# Patient Record
Sex: Female | Born: 1960 | Race: White | Hispanic: No | Marital: Married | Smoking: Never smoker
Health system: Southern US, Community
[De-identification: ages and names within clinical notes are randomized; demographics above are authoritative.]

## PROBLEM LIST (undated history)

## (undated) DIAGNOSIS — G5601 Carpal tunnel syndrome, right upper limb: Secondary | ICD-10-CM

## (undated) DIAGNOSIS — Z9889 Other specified postprocedural states: Secondary | ICD-10-CM

## (undated) DIAGNOSIS — E785 Hyperlipidemia, unspecified: Secondary | ICD-10-CM

## (undated) DIAGNOSIS — Z8669 Personal history of other diseases of the nervous system and sense organs: Secondary | ICD-10-CM

## (undated) DIAGNOSIS — Q185 Microstomia: Secondary | ICD-10-CM

## (undated) DIAGNOSIS — Z87442 Personal history of urinary calculi: Secondary | ICD-10-CM

## (undated) DIAGNOSIS — R112 Nausea with vomiting, unspecified: Secondary | ICD-10-CM

## (undated) DIAGNOSIS — Z98811 Dental restoration status: Secondary | ICD-10-CM

## (undated) DIAGNOSIS — K589 Irritable bowel syndrome without diarrhea: Secondary | ICD-10-CM

## (undated) DIAGNOSIS — K219 Gastro-esophageal reflux disease without esophagitis: Secondary | ICD-10-CM

## (undated) DIAGNOSIS — M1990 Unspecified osteoarthritis, unspecified site: Secondary | ICD-10-CM

## (undated) DIAGNOSIS — Z8489 Family history of other specified conditions: Secondary | ICD-10-CM

## (undated) HISTORY — DX: Hyperlipidemia, unspecified: E78.5

## (undated) HISTORY — PX: EYE SURGERY: SHX253

## (undated) HISTORY — PX: CYSTO: SHX6284

## (undated) HISTORY — PX: BACK SURGERY: SHX140

## (undated) HISTORY — PX: MAXIMUM ACCESS (MAS)POSTERIOR LUMBAR INTERBODY FUSION (PLIF) 1 LEVEL: SHX6368

## (undated) HISTORY — DX: Irritable bowel syndrome, unspecified: K58.9

## (undated) HISTORY — DX: Gastro-esophageal reflux disease without esophagitis: K21.9

---

## 1998-08-02 ENCOUNTER — Other Ambulatory Visit: Admission: RE | Admit: 1998-08-02 | Discharge: 1998-08-02 | Payer: Self-pay | Admitting: Obstetrics and Gynecology

## 1999-08-13 ENCOUNTER — Other Ambulatory Visit: Admission: RE | Admit: 1999-08-13 | Discharge: 1999-08-13 | Payer: Self-pay | Admitting: Obstetrics and Gynecology

## 2000-09-17 ENCOUNTER — Other Ambulatory Visit: Admission: RE | Admit: 2000-09-17 | Discharge: 2000-09-17 | Payer: Self-pay | Admitting: Obstetrics and Gynecology

## 2001-09-30 ENCOUNTER — Other Ambulatory Visit: Admission: RE | Admit: 2001-09-30 | Discharge: 2001-09-30 | Payer: Self-pay | Admitting: Obstetrics and Gynecology

## 2002-03-31 ENCOUNTER — Inpatient Hospital Stay (HOSPITAL_COMMUNITY): Admission: AD | Admit: 2002-03-31 | Discharge: 2002-04-02 | Payer: Self-pay | Admitting: Obstetrics and Gynecology

## 2002-03-31 ENCOUNTER — Encounter: Payer: Self-pay | Admitting: Obstetrics and Gynecology

## 2002-03-31 ENCOUNTER — Encounter (INDEPENDENT_AMBULATORY_CARE_PROVIDER_SITE_OTHER): Payer: Self-pay

## 2002-03-31 HISTORY — PX: SALPINGOOPHORECTOMY: SHX82

## 2002-03-31 HISTORY — PX: TOTAL ABDOMINAL HYSTERECTOMY: SHX209

## 2003-03-08 ENCOUNTER — Other Ambulatory Visit: Admission: RE | Admit: 2003-03-08 | Discharge: 2003-03-08 | Payer: Self-pay | Admitting: Obstetrics and Gynecology

## 2003-06-14 ENCOUNTER — Ambulatory Visit (HOSPITAL_BASED_OUTPATIENT_CLINIC_OR_DEPARTMENT_OTHER): Admission: RE | Admit: 2003-06-14 | Discharge: 2003-06-14 | Payer: Self-pay | Admitting: Orthopedic Surgery

## 2003-06-14 HISTORY — PX: INCISION / DEBRIDEMENT BONE ELBOW: SUR694

## 2003-08-14 ENCOUNTER — Encounter: Admission: RE | Admit: 2003-08-14 | Discharge: 2003-11-12 | Payer: Self-pay | Admitting: Orthopedic Surgery

## 2005-07-11 ENCOUNTER — Other Ambulatory Visit: Admission: RE | Admit: 2005-07-11 | Discharge: 2005-07-11 | Payer: Self-pay | Admitting: Obstetrics and Gynecology

## 2006-10-27 IMAGING — CR DG CHEST 2V
2 series · 2 of 2 positions shown · non-contrast
Comparison: None.

CLINICAL DATA: Fever.
 CHEST - 2 VIEW:

[w chest pa]
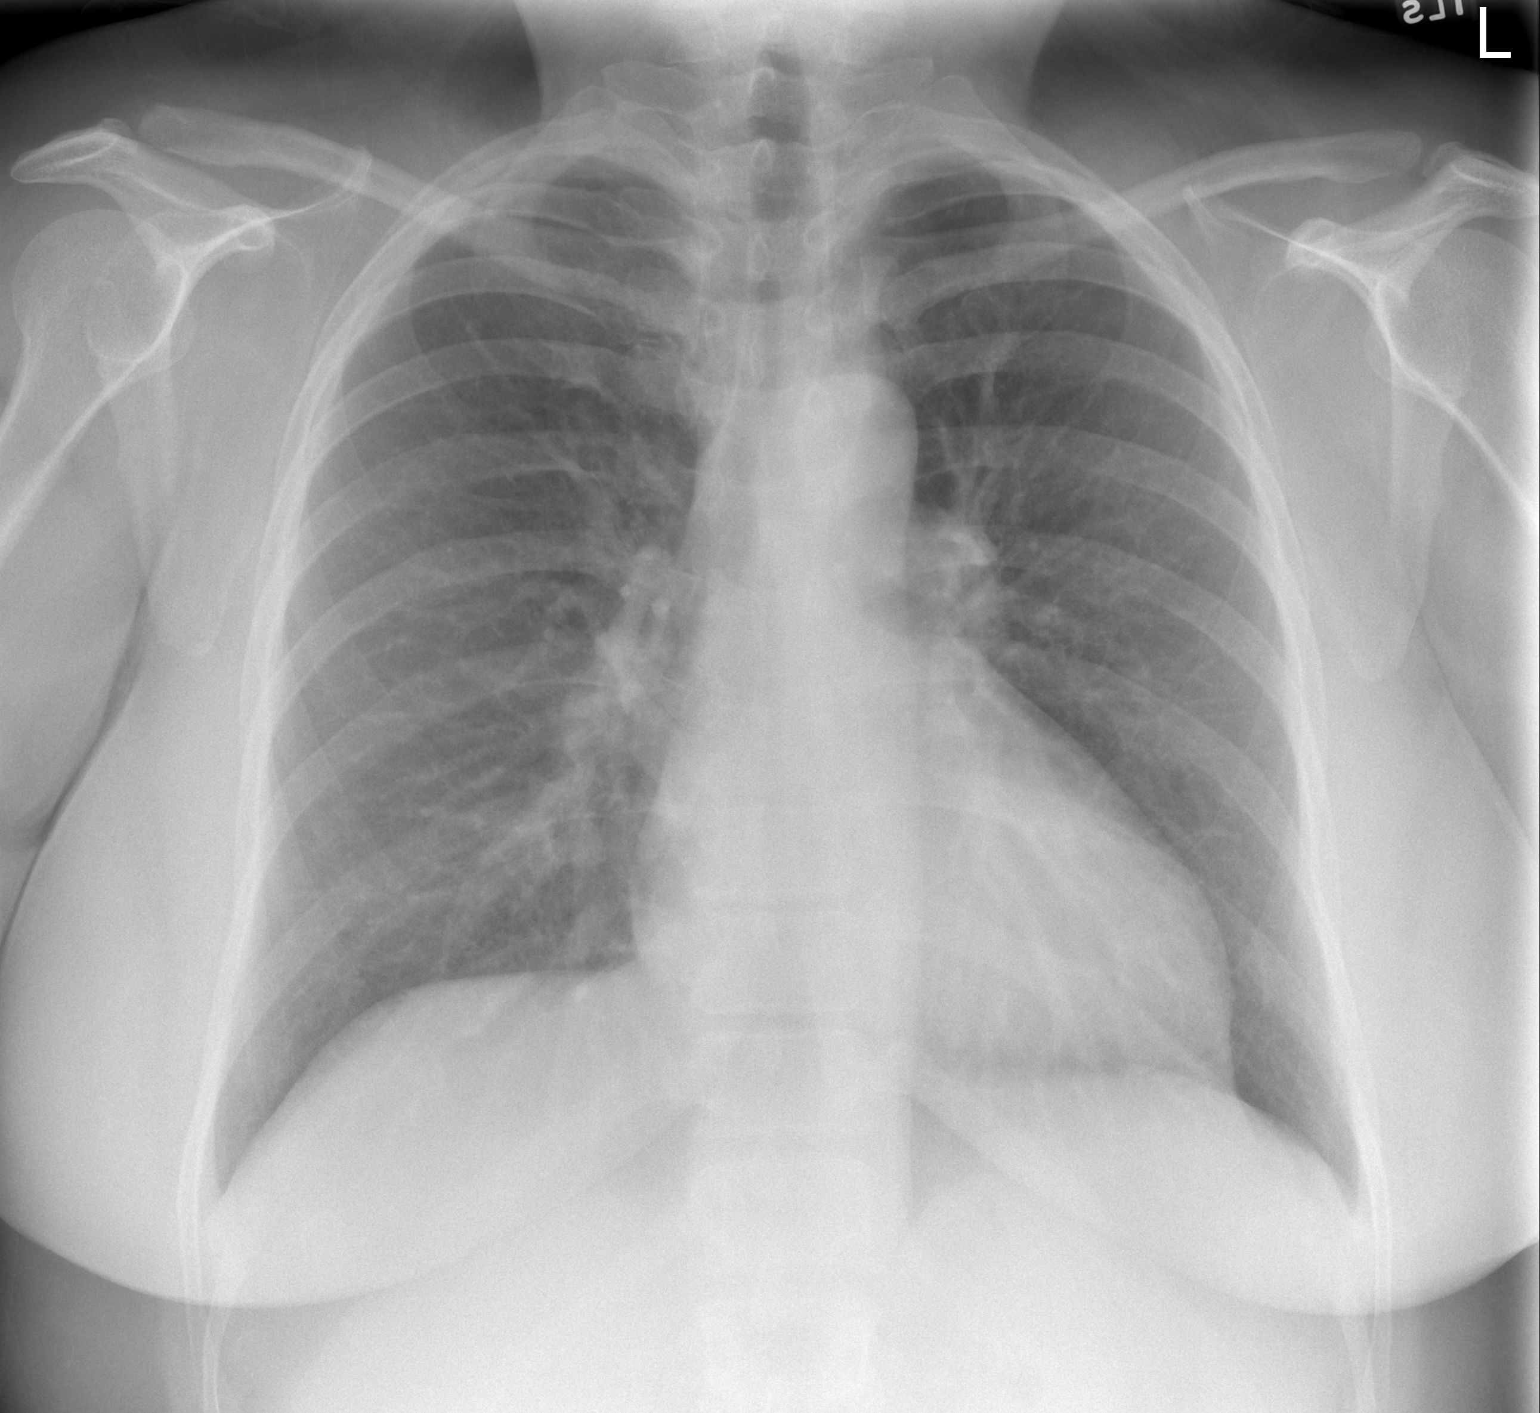

[w chest lat]
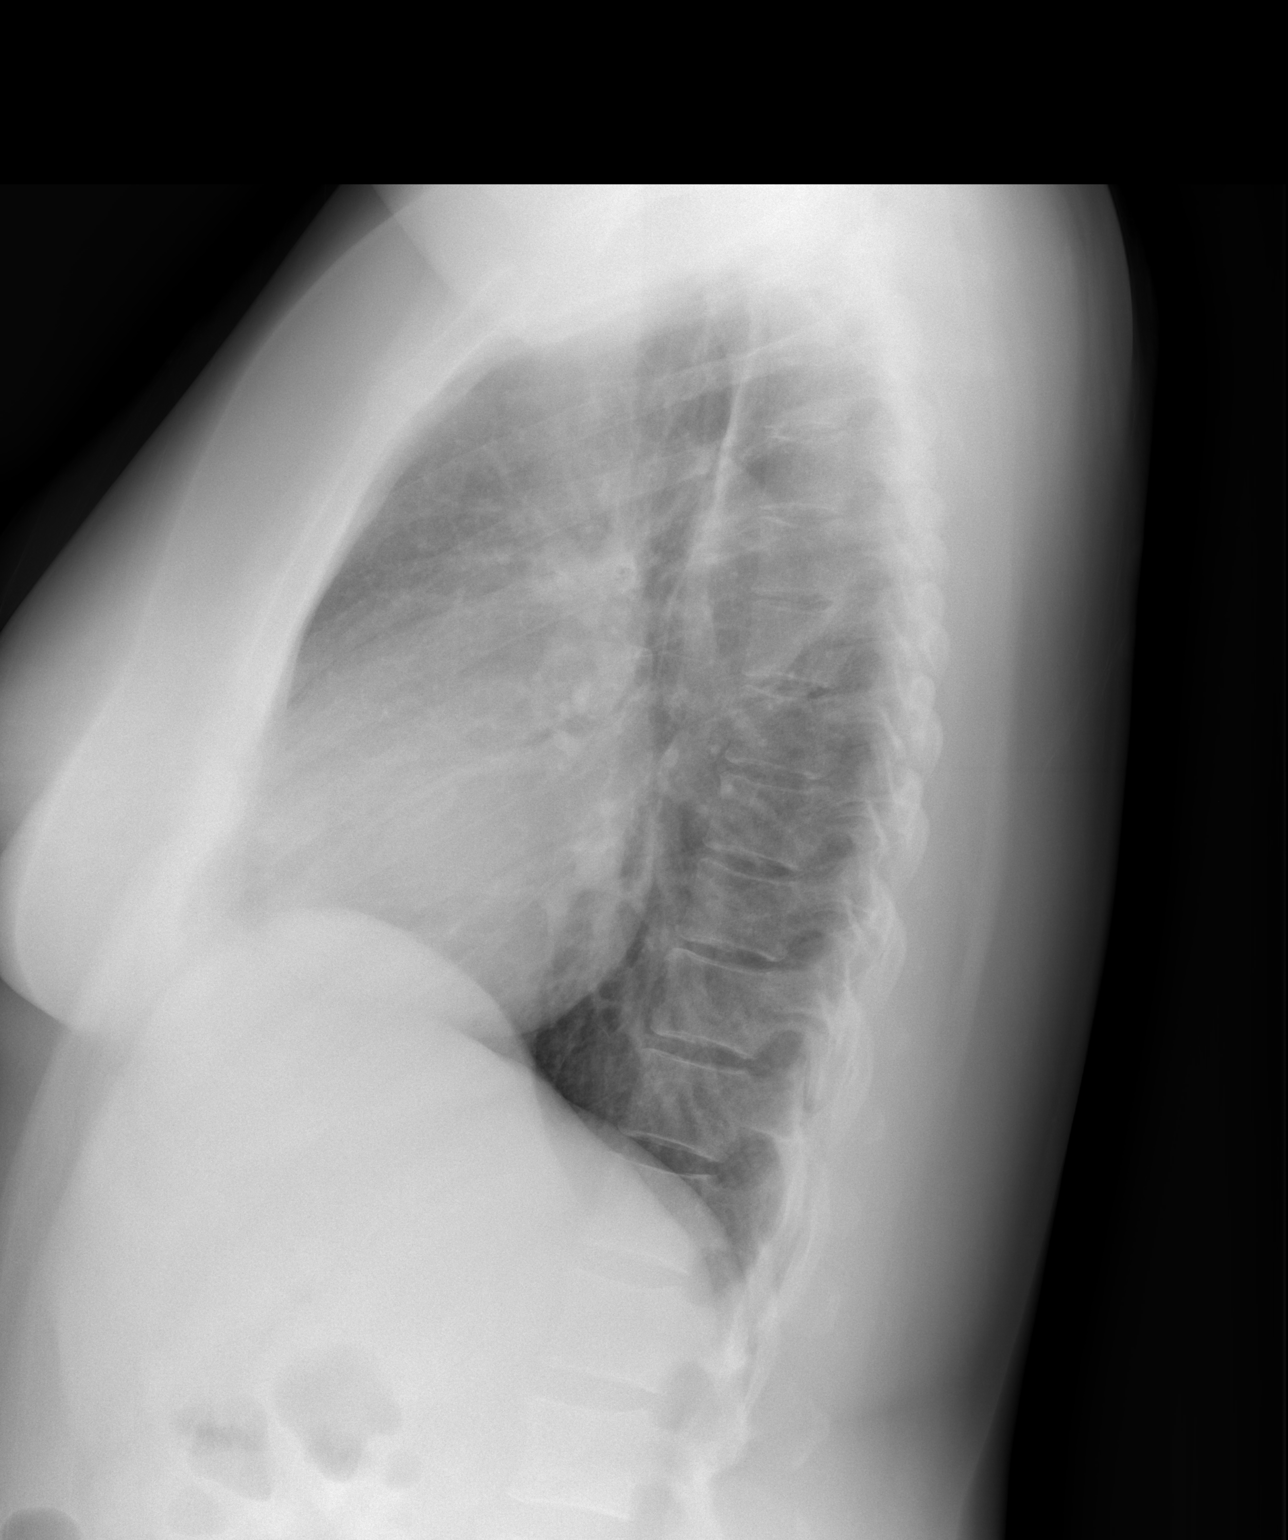

[2 of 2 positions shown; findings below may reference images not displayed]

FINDINGS: Heart size mildly enlarged.  The lungs are clear.  No pleural effusion.
IMPRESSION: Mild cardiomegaly, no edema.

## 2006-11-07 ENCOUNTER — Encounter: Admission: RE | Admit: 2006-11-07 | Discharge: 2006-11-07 | Payer: Self-pay | Admitting: Family Medicine

## 2006-11-07 IMAGING — CT CT PELVIS W/O CM
2 of 3 series · 17 of 46 positions shown, 19 images · IV contrast (agent unspecified)
Comparison: none

CLINICAL DATA: 45-year-old female, right flank pain.   History of renal calculi.  Status-post hysterectomy.  
ABDOMEN CT WITHOUT CONTRAST:
TECHNIQUE: Multidetector CT imaging of the abdomen was performed following the standard protocol without IV contrast.
TECHNIQUE: Multidetector CT imaging of the pelvis was performed following the standard protocol without IV contrast.

[Series 2: renal stone w/o · axial · non-contrast · 0.70mm/px · z∈[-432,-52]mm · 14 of 88 slices shown, 16 images]
[im 6/88  soft-tissue]
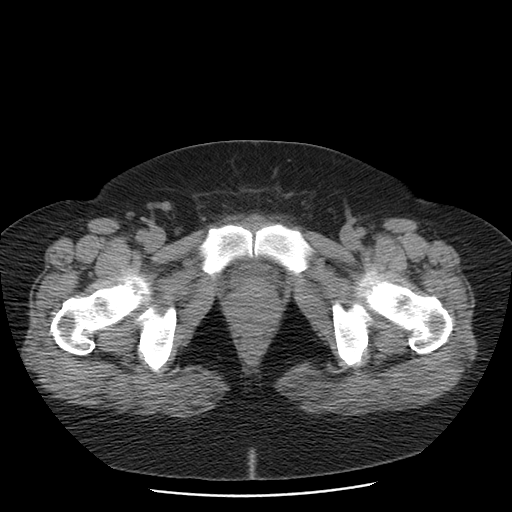
[im 6/88  bone]
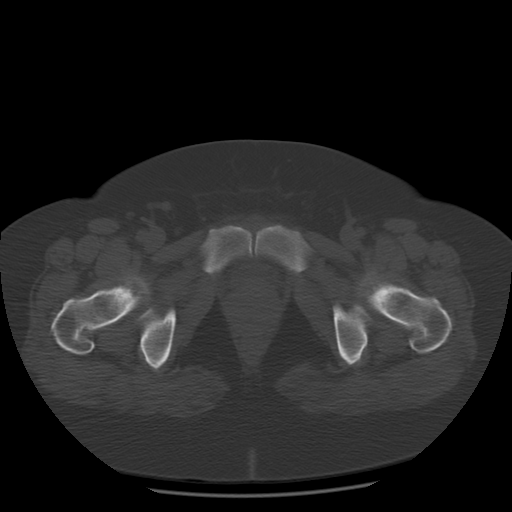
[im 12/88  soft-tissue]
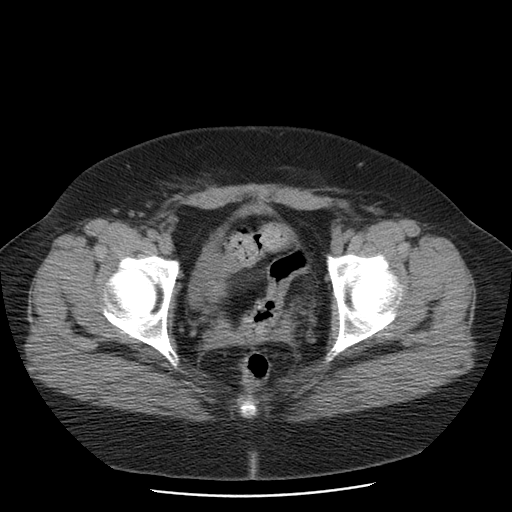
[im 17/88  soft-tissue]
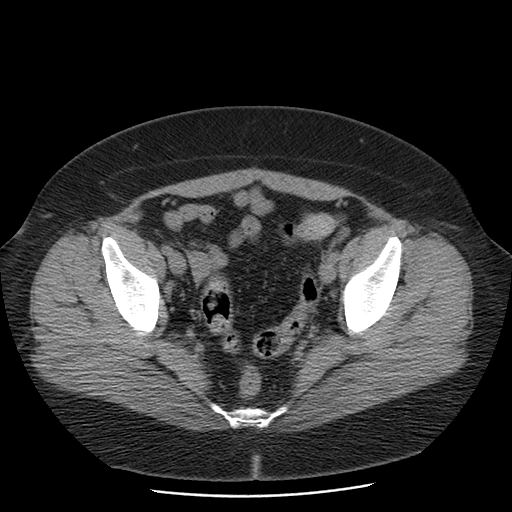
[im 23/88  soft-tissue]
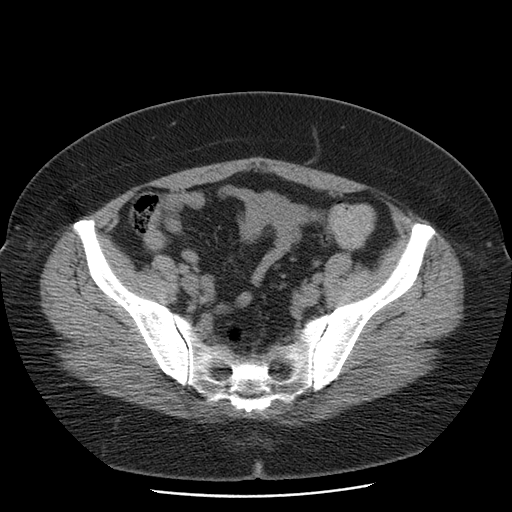
[im 29/88  soft-tissue]
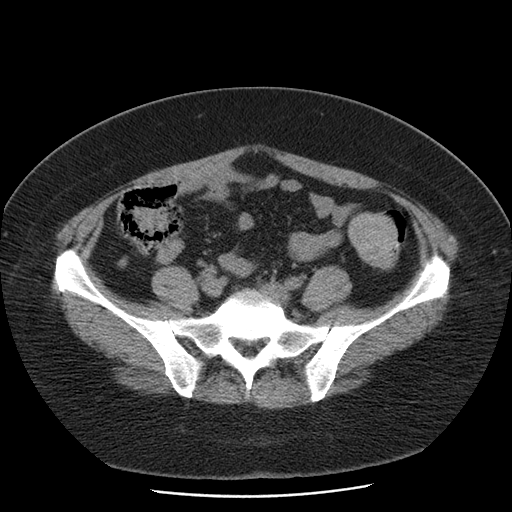
[im 34/88  soft-tissue]
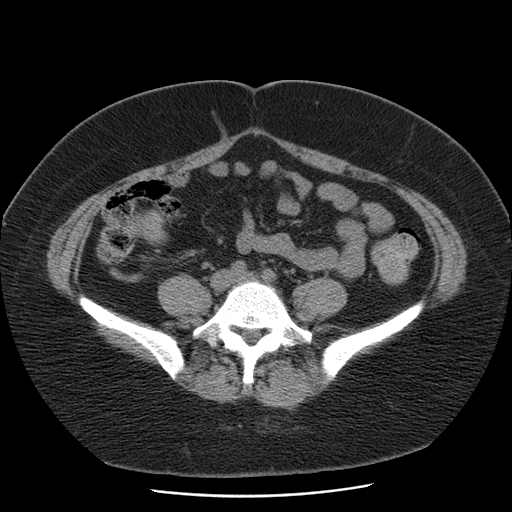
[im 40/88  soft-tissue]
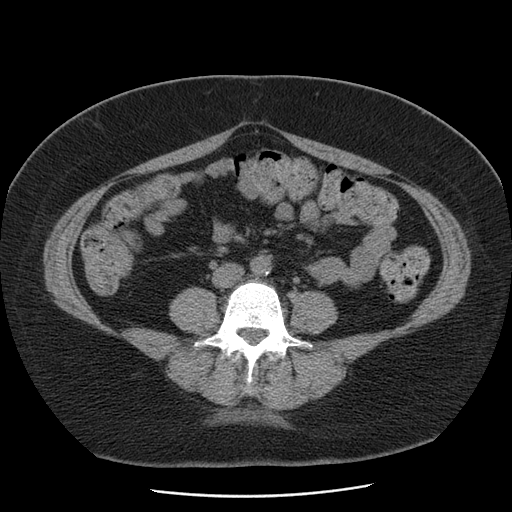
[im 48/88  soft-tissue]
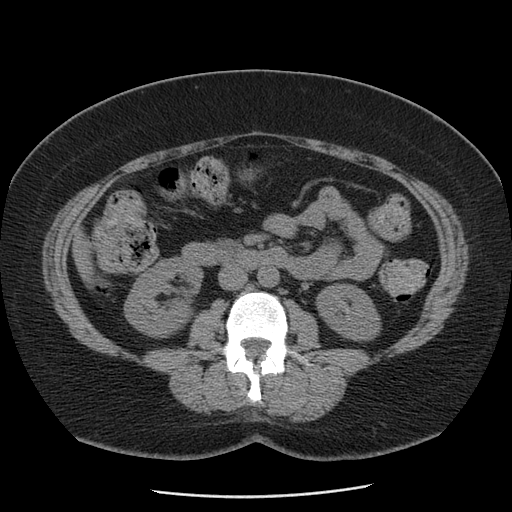
[im 54/88  soft-tissue]
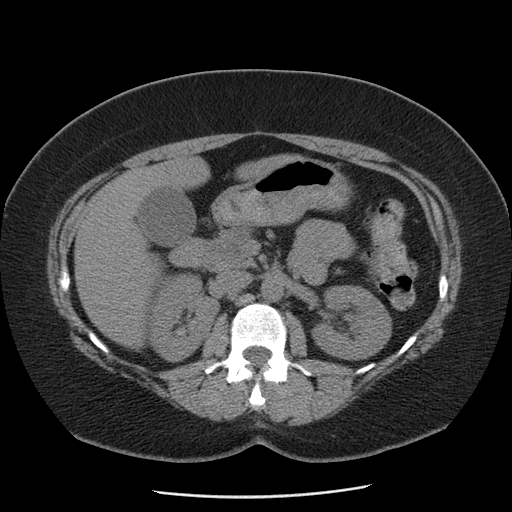
[im 54/88  bone]
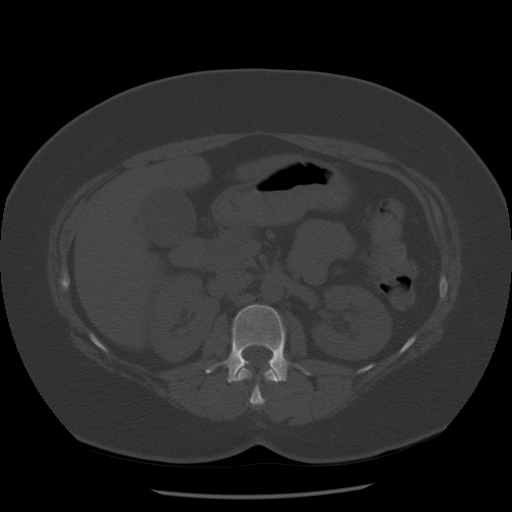
[im 59/88  soft-tissue]
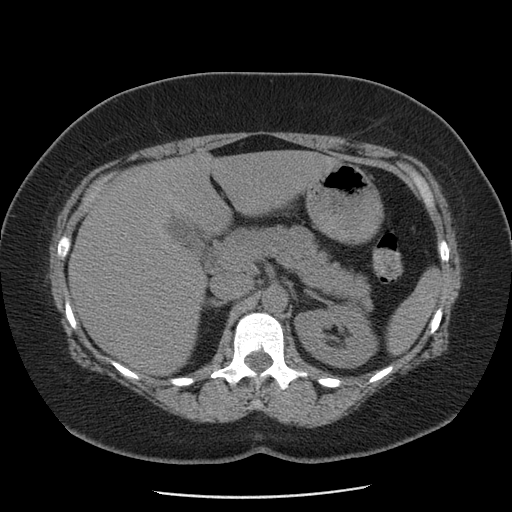
[im 65/88  soft-tissue]
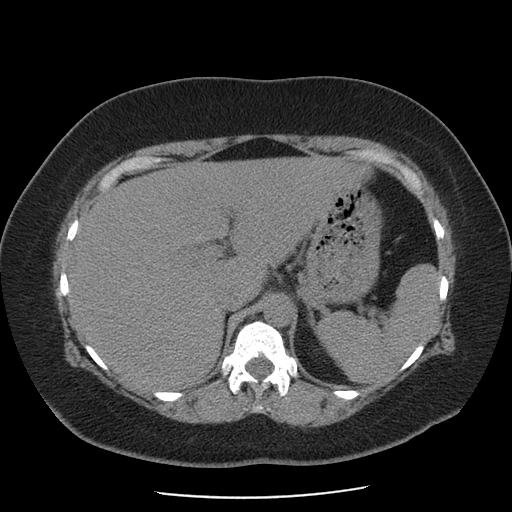
[im 71/88  soft-tissue]
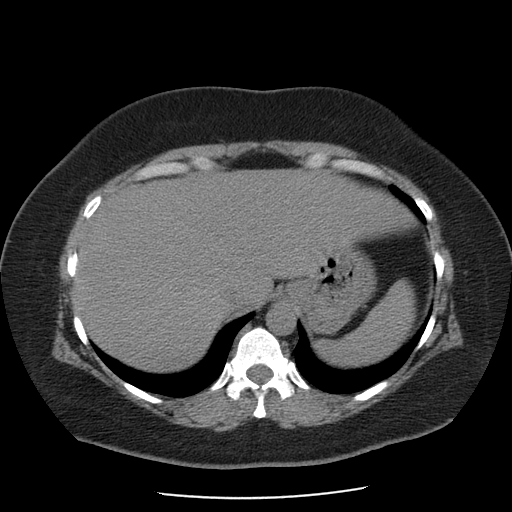
[im 76/88  soft-tissue]
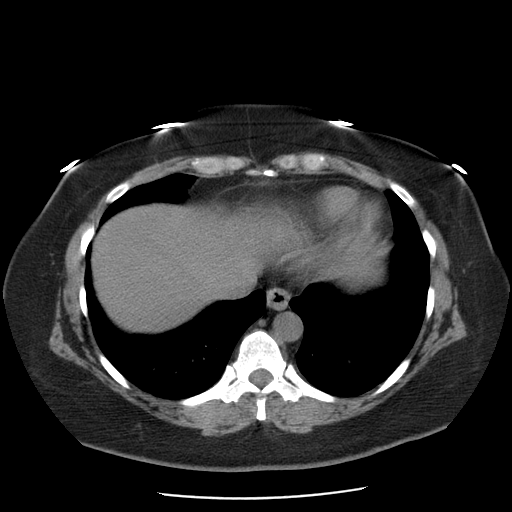
[im 82/88  soft-tissue]
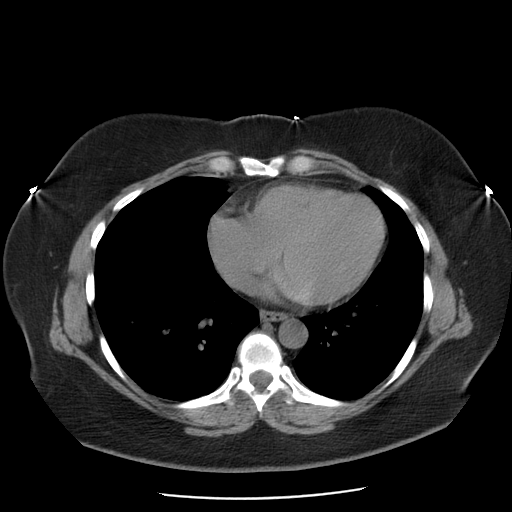

[Series 400: coronals · coronal · 0.90mm/px · 3 of 103 slices shown]
[im 35/103  soft-tissue]
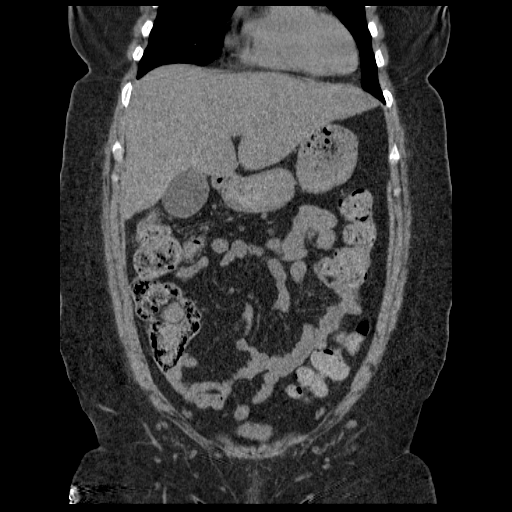
[im 46/103  soft-tissue]
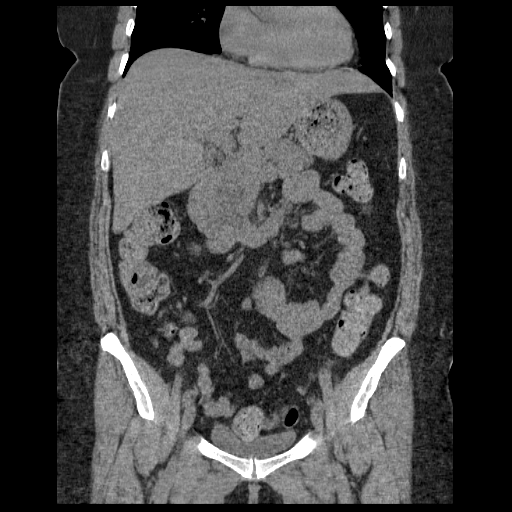
[im 57/103  soft-tissue]
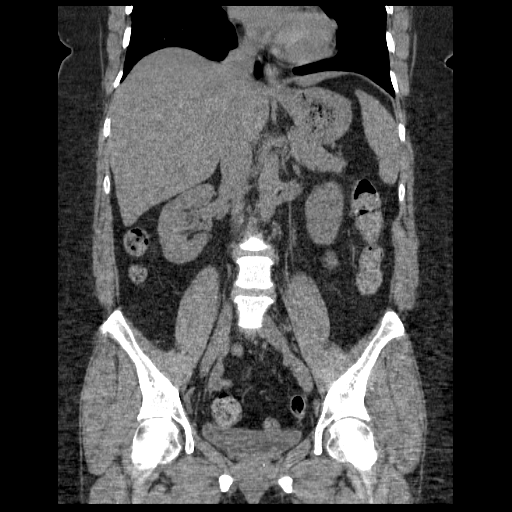

[17 of 46 positions shown; findings below may reference images not displayed]

FINDINGS: Kidneys demonstrate no obstructing upper urinary tract calculi, acute obstructive uropathy, hydronephrosis, or dilated ureter.  Kidney are symmetric in size, shape, and contour.  The liver, gallbladder, biliary system, adrenal glands, spleen, and pancreas are normal for a noncontrast study.  No obstruction of the bowel, ascites, adenopathy, fluid collection, or free air.  In the right lower quadrant, the terminal ileum and appendix are normal.  Minimal atherosclerosis of the aortic bifurcation.   No aneurysm.   Punctate tiny 1-2 mm non-obstructing left renal calculi are noted on the coronal reconstructions, image 63.
IMPRESSION: 1.  No acute obstructive uropathy or obstructing urinary tract calculi.  No acute finding in the abdomen by noncontrast imaging. 
2.  Suspect tiny punctate nonobstructing left nephrolithiasis. 
PELVIS CT WITHOUT CONTRAST:
FINDINGS: No distal ureteral obstruction, dilatation, calculus or UVJ abnormality.  Patient is status-post hysterectomy.  No additional acute pelvic finding.
IMPRESSION: No acute intrapelvic finding or distal stone disease.

## 2007-03-23 IMAGING — CT CT PELV EXAM
2 of 4 series · 12 of 42 positions shown, 19 images · non-contrast
Comparison: NONE

CLINICAL DATA: Severe left lower quadrant pain on [DATE]; 
mild  discomfort since. Evaluate for femoral hernia 

CT PELVIS WITHOUT AND WITH INTRAVENOUS AND

[Series 3: venous · axial · portal-venous · 0.75mm/px · z∈[+610,+844]mm · 9 of 59 slices shown, 15 images]
[im 6/59  soft-tissue]
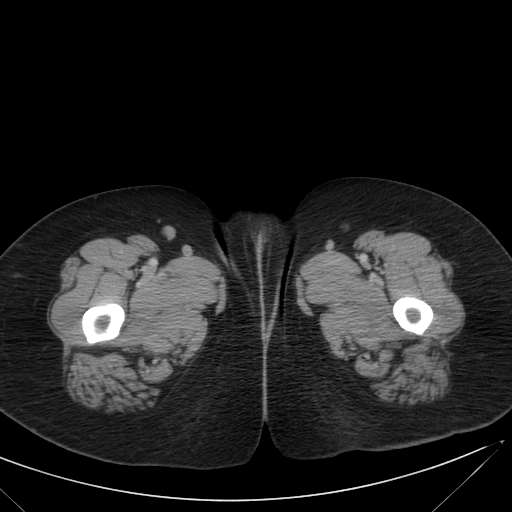
[im 6/59  bone]
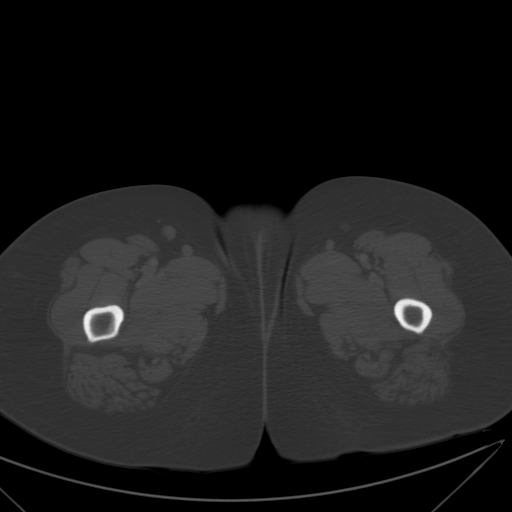
[im 12/59  soft-tissue]
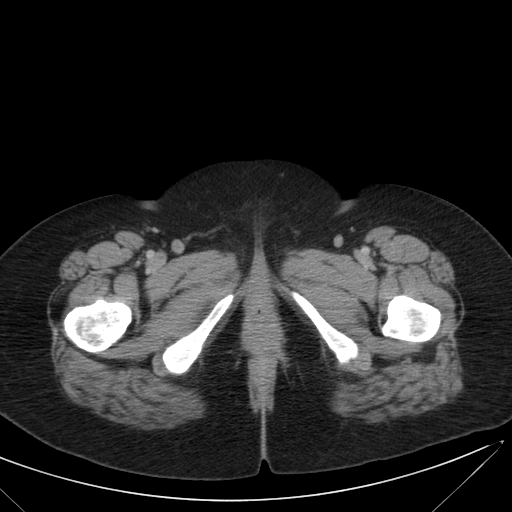
[im 18/59  soft-tissue]
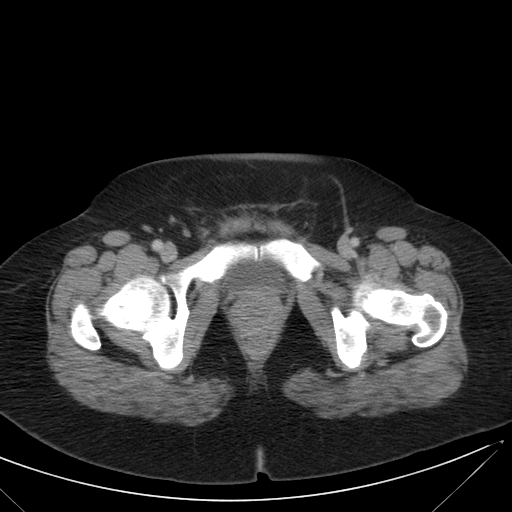
[im 24/59  soft-tissue]
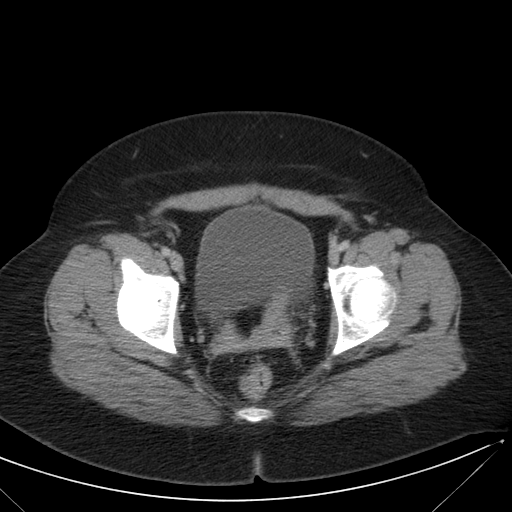
[im 30/59  soft-tissue]
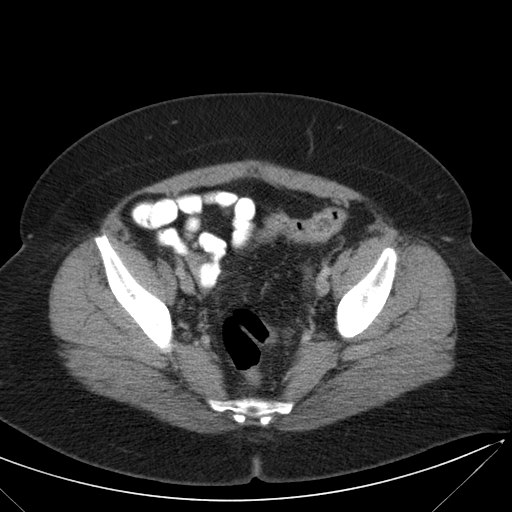
[im 35/59  soft-tissue]
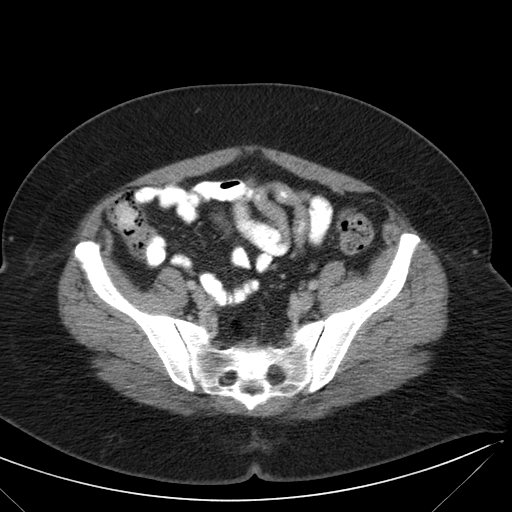
[im 35/59  lung]
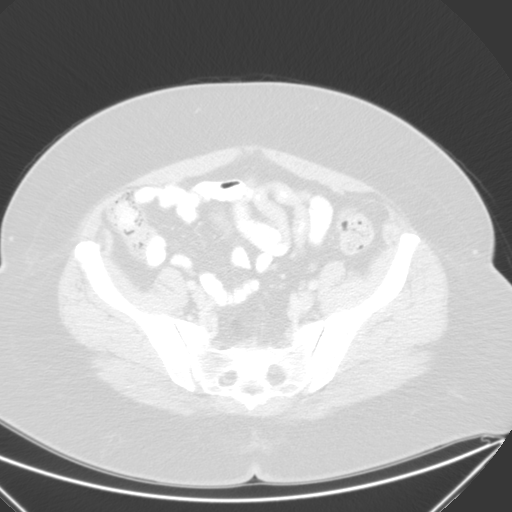
[im 41/59  soft-tissue]
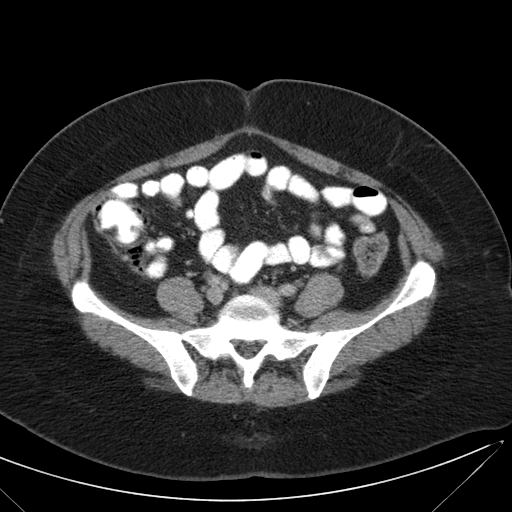
[im 41/59  lung]
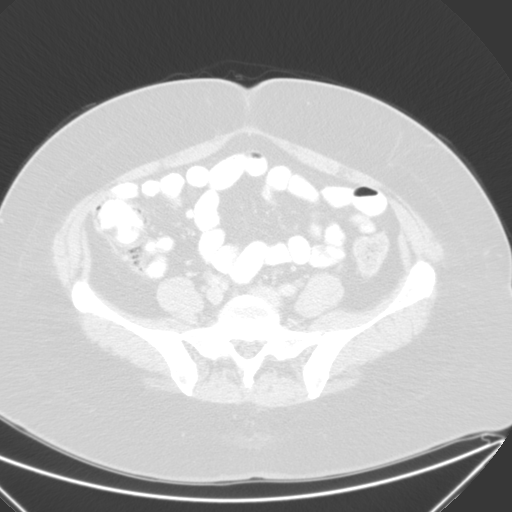
[im 47/59  soft-tissue]
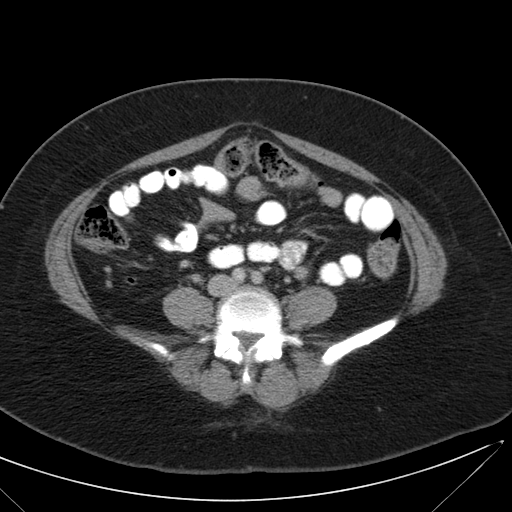
[im 47/59  lung]
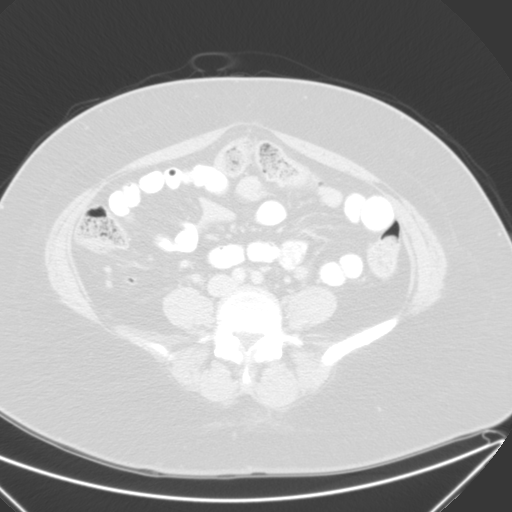
[im 53/59  soft-tissue]
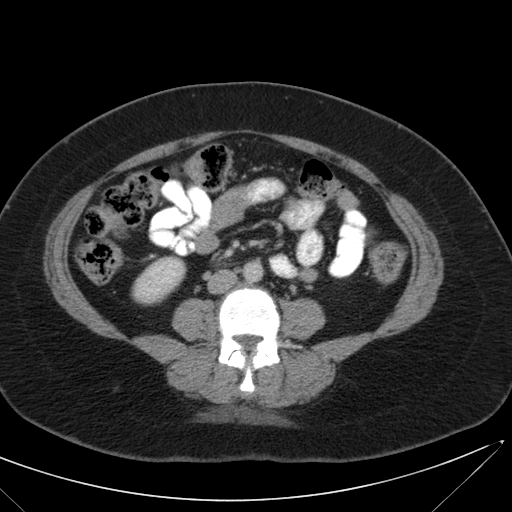
[im 53/59  lung]
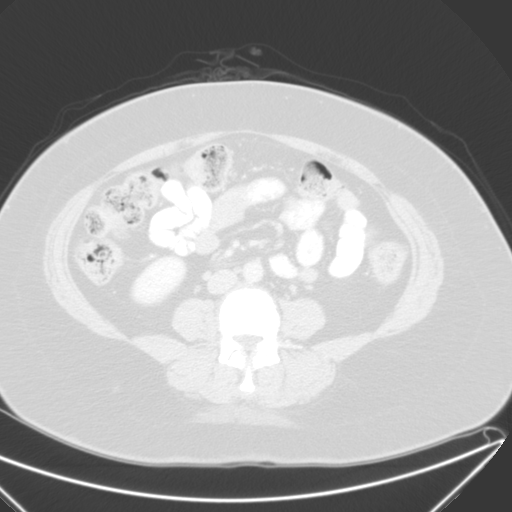
[im 53/59  bone]
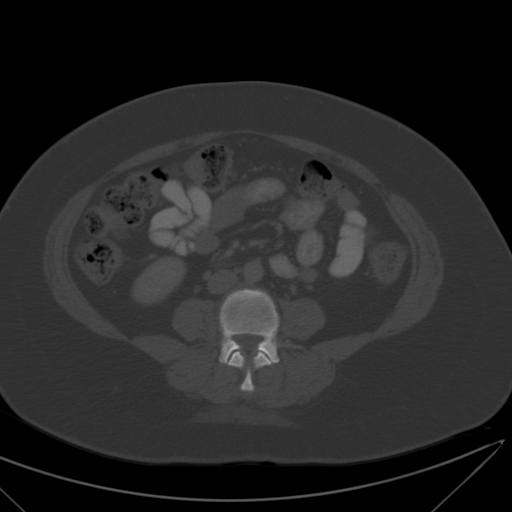

[coronals · coronal · 0.75mm/px · 3 of 72 slices shown, 4 images]
[im 24/72  soft-tissue]
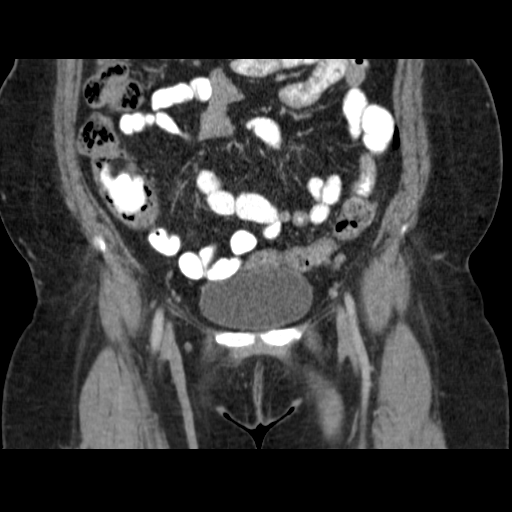
[im 32/72  soft-tissue]
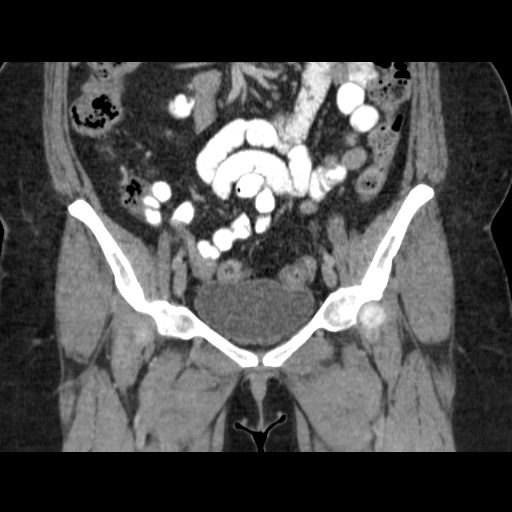
[im 32/72  bone]
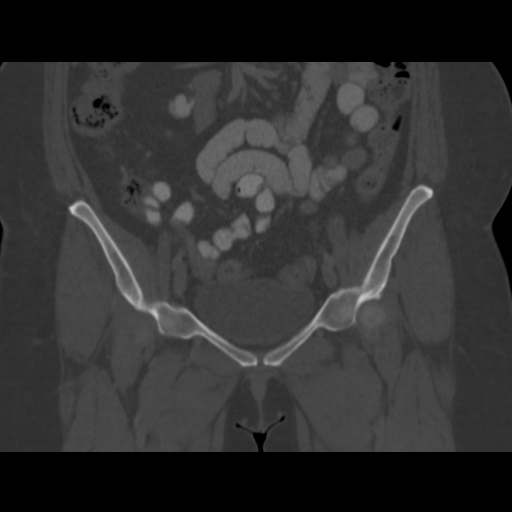
[im 40/72  soft-tissue]
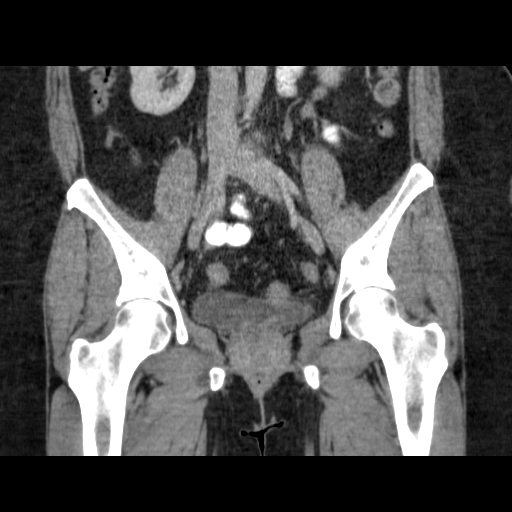

[12 of 42 positions shown; findings below may reference images not displayed]

FOLLOWING ORAL CONTRAST TECHNIQUE: Multiple axial images were 
obtained following oral and intravenous contrast.
FINDINGS: No evidence of appendicitis, diverticulitis, hernia, or 
bowel obstruction. Specifically, no evidence of inguinal or 
femoral hernia. No pelvic masses.  No lytic or blastic lesions or 
fractures.
IMPRESSION: No evidence of inflammatory process, hernia, or bowel 
obstruction. PRUDENCIO electronically reviewed on 
[DATE] Dict Date: [DATE]  Tran Date: [DATE] DAS  JLM

## 2007-04-18 ENCOUNTER — Other Ambulatory Visit: Payer: Self-pay | Admitting: Emergency Medicine

## 2007-04-18 ENCOUNTER — Inpatient Hospital Stay (HOSPITAL_COMMUNITY): Admission: EM | Admit: 2007-04-18 | Discharge: 2007-04-20 | Payer: Self-pay | Admitting: Emergency Medicine

## 2007-04-18 IMAGING — CT CT ABDOMEN W/ CM
2 of 6 series · 16 of 46 positions shown, 18 images · IV contrast (APPLIED)
Comparison: Comparison is made to prior CT [DATE].

CLINICAL DATA: Epigastric pain, fever, nausea, and vomiting.
 ABDOMEN CT WITH CONTRAST:
TECHNIQUE: Multidetector CT imaging of the abdomen was performed following the standard protocol during bolus administration of intravenous contrast.
 Contrast:  125 cc Omnipaque 300 IV.  Oral contrast was also given.
TECHNIQUE: Multidetector CT imaging of the pelvis was performed following the standard protocol during bolus administration of intravenous contrast.

[Series 3: abd_pel 5.0 b40f st · axial · 0.71mm/px · z∈[-301,+109]mm · 13 of 96 slices shown, 15 images]
[im 7/96  soft-tissue]
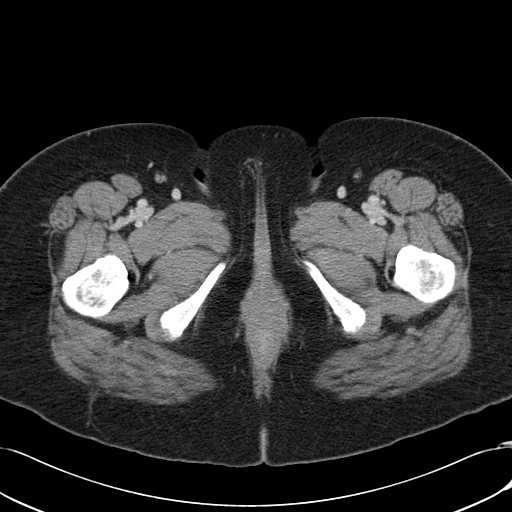
[im 7/96  bone]
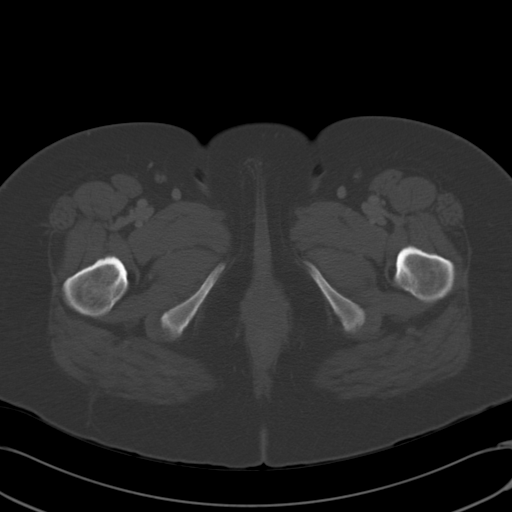
[im 14/96  soft-tissue]
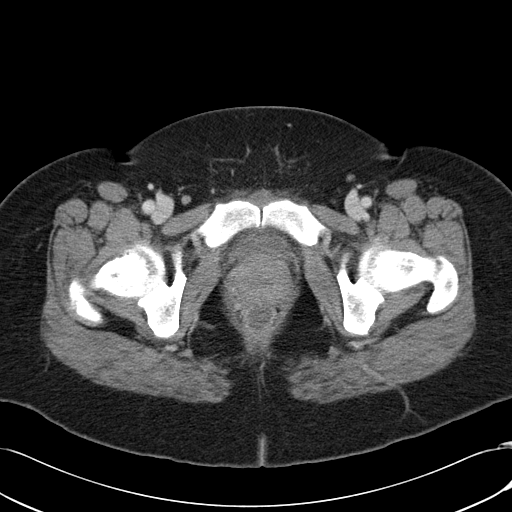
[im 21/96  soft-tissue]
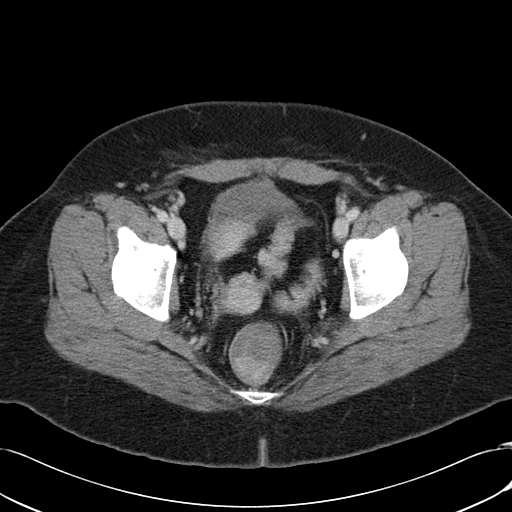
[im 28/96  soft-tissue]
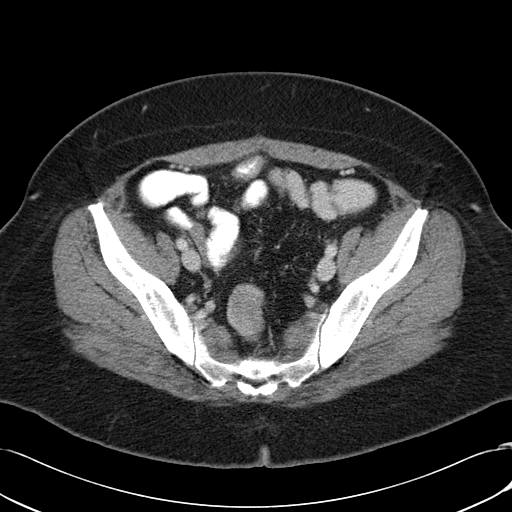
[im 34/96  soft-tissue]
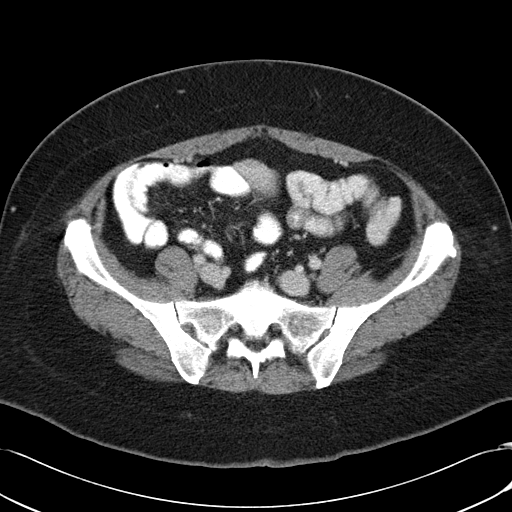
[im 41/96  soft-tissue]
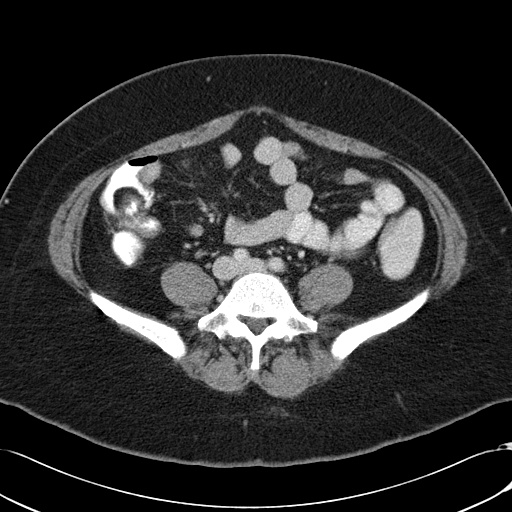
[im 48/96  soft-tissue]
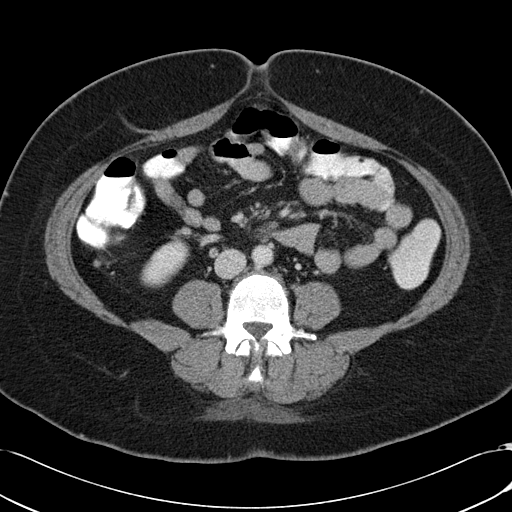
[im 55/96  soft-tissue]
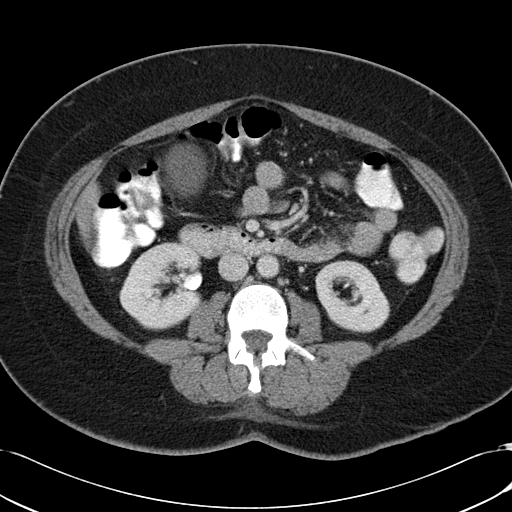
[im 62/96  soft-tissue]
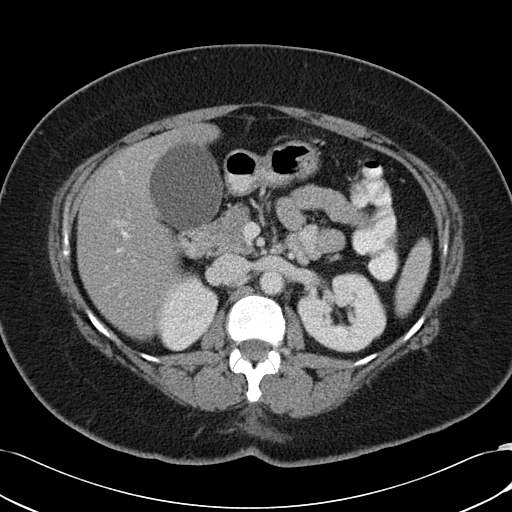
[im 62/96  bone]
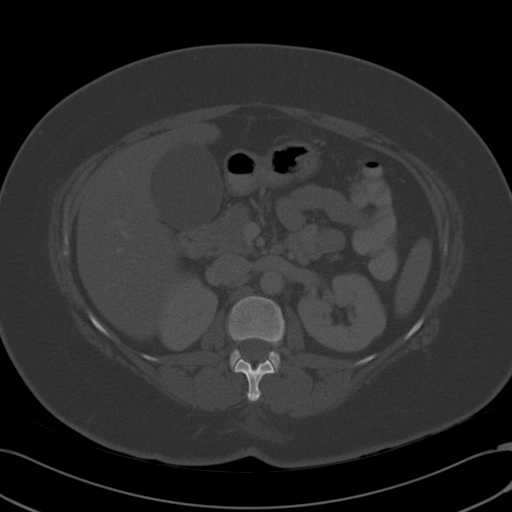
[im 68/96  soft-tissue]
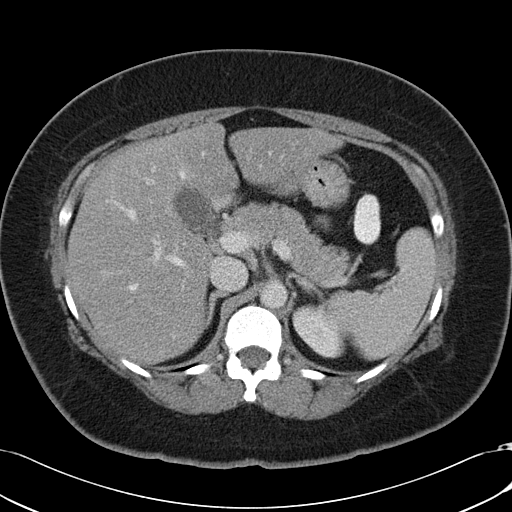
[im 75/96  soft-tissue]
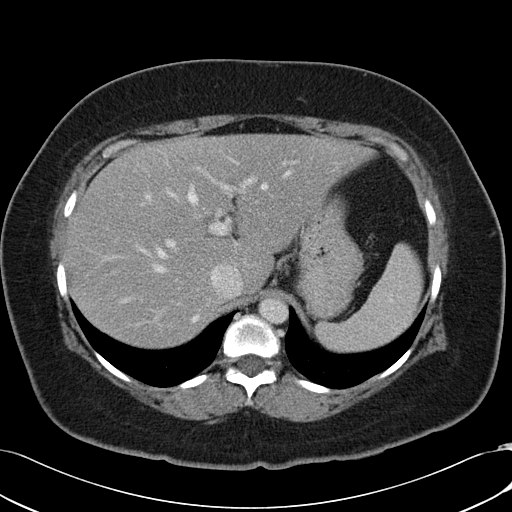
[im 82/96  soft-tissue]
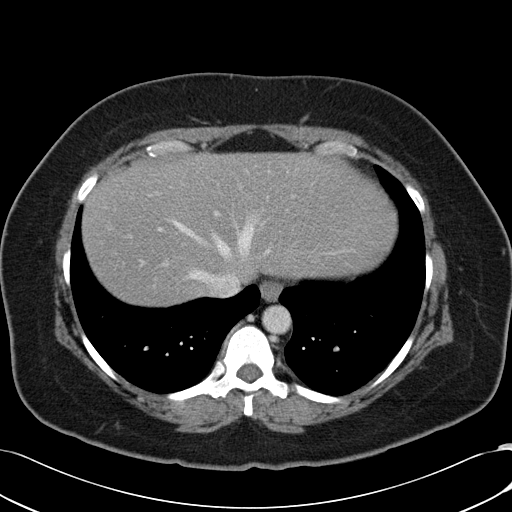
[im 89/96  soft-tissue]
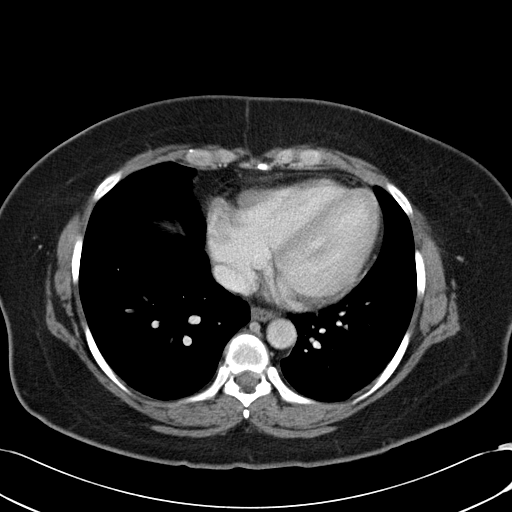

[Series 602: coronal · coronal · 0.97mm/px · 3 of 82 slices shown]
[im 28/82  soft-tissue]
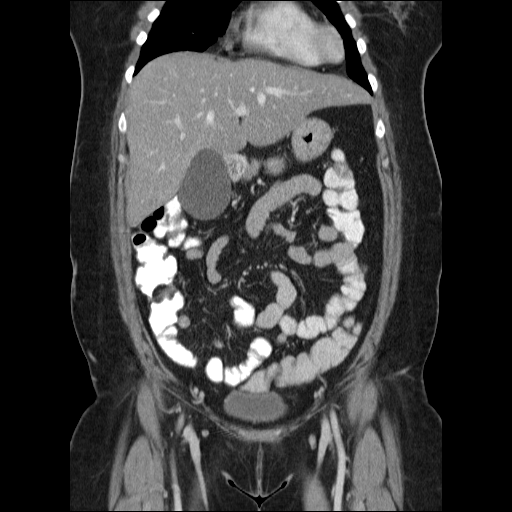
[im 37/82  soft-tissue]
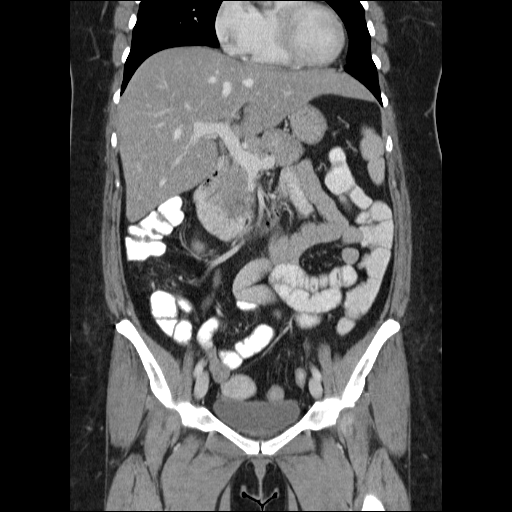
[im 46/82  soft-tissue]
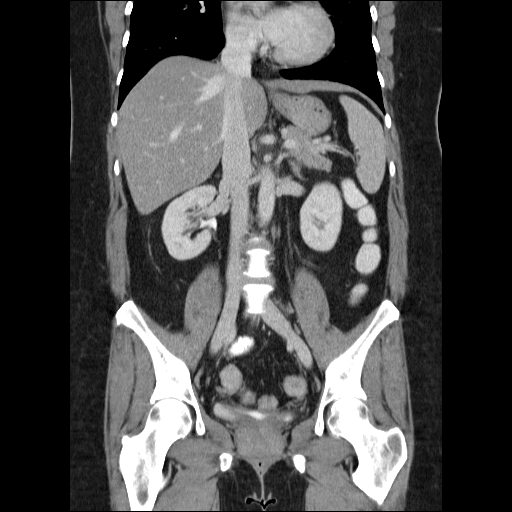

[16 of 46 positions shown; findings below may reference images not displayed]

FINDINGS: The gallbladder is distended, but otherwise unremarkable.  Abdominal viscera are otherwise unremarkable.
IMPRESSION: No acute intraabdominal finding.  Gallbladder distention may be seen if the patient is fasting.
 PELVIS CT WITH CONTRAST:
FINDINGS: Fat-containing umbilical hernia noted.  Appendix, large and small bowel are unremarkable.  Uterus is surgically absent.  Right ovary is unremarkable.  No radiopaque ureteral calculus.
IMPRESSION: No acute intrapelvic finding.  Normal appendix.

## 2007-04-18 IMAGING — US US ABDOMEN COMPLETE
1 series · 14 of 25 positions shown · non-contrast
Comparison: none

CLINICAL DATA: Abdominal pain.
ABDOMEN ULTRASOUND:
TECHNIQUE: Complete abdominal ultrasound examination was performed including evaluation of the liver, gallbladder, bile ducts, pancreas, kidneys, spleen, IVC, and abdominal aorta.

[Series 1: unknown · 0.38mm/px · 14 of 57 slices shown]
[im 1/57]
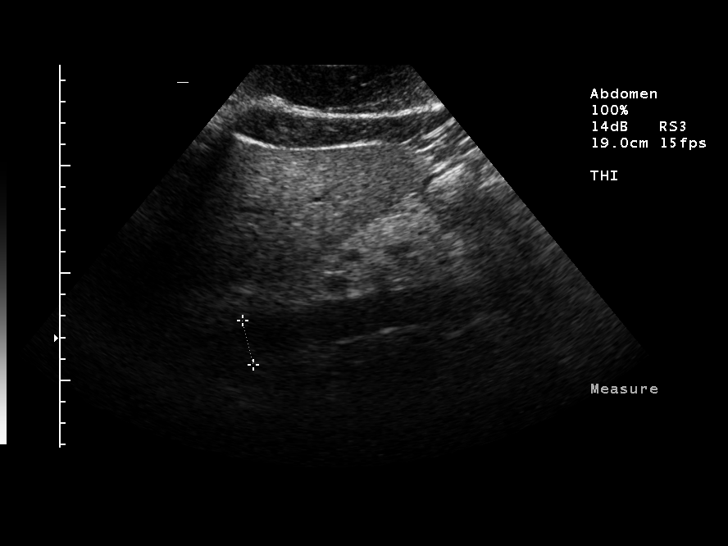
[im 5/57]
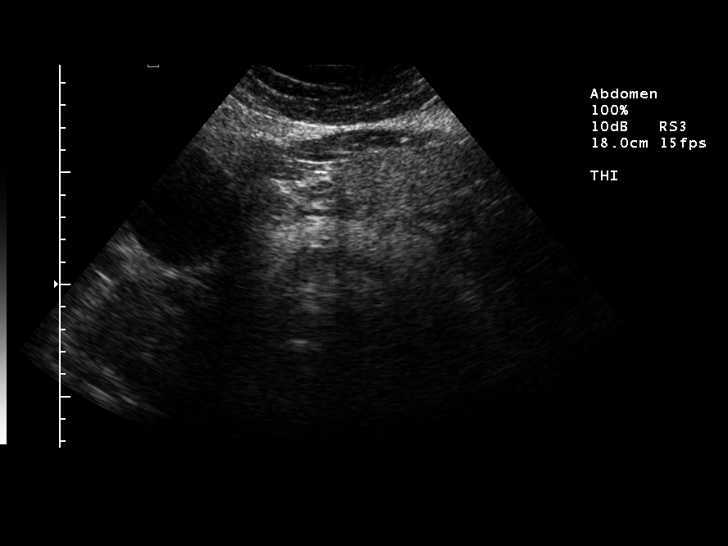
[im 10/57]
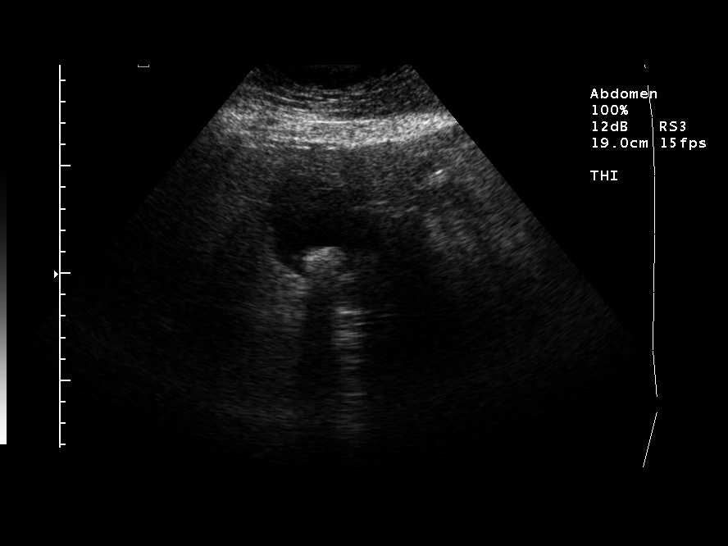
[im 15/57]
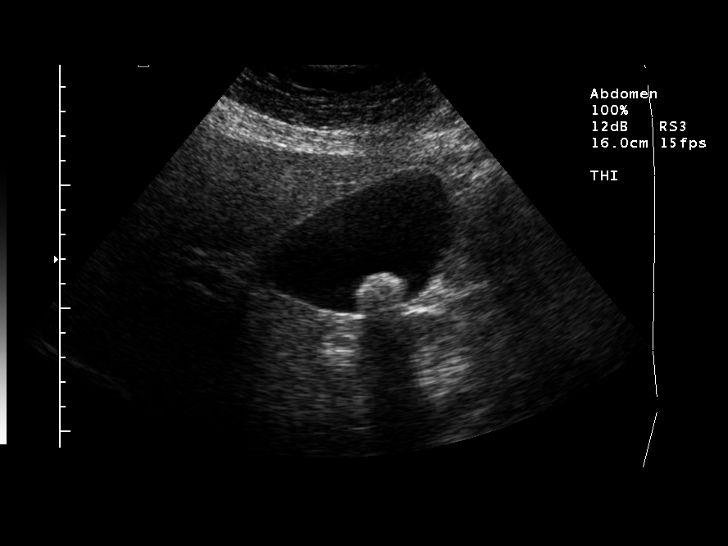
[im 19/57]
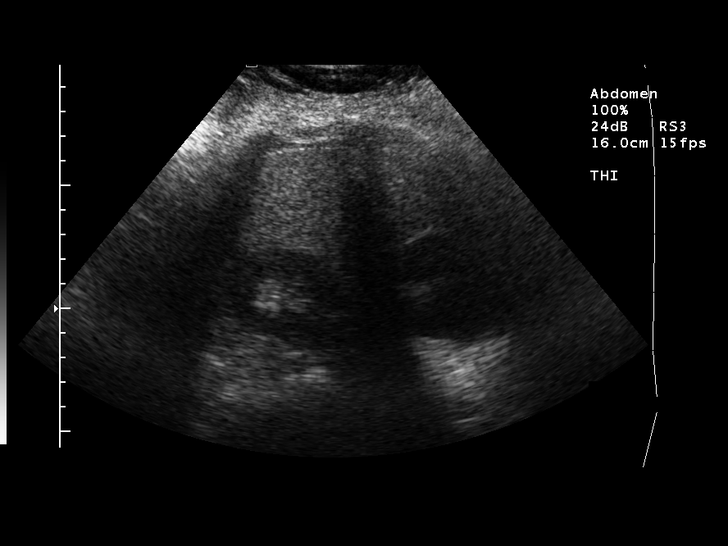
[im 22/57]
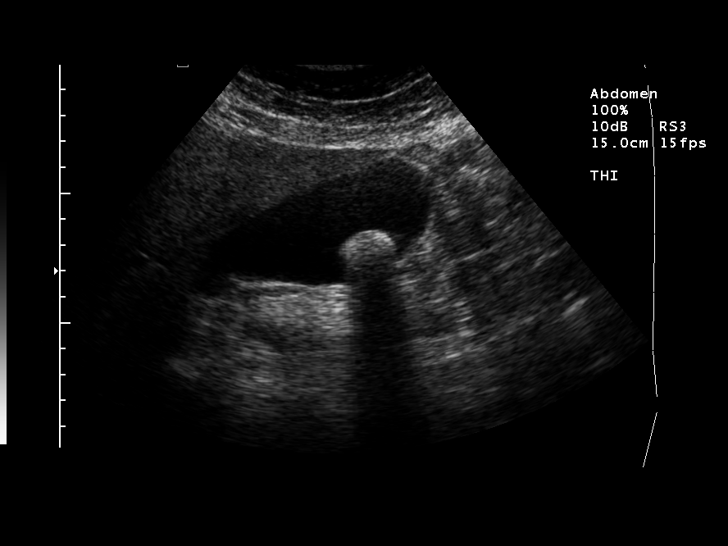
[im 26/57]
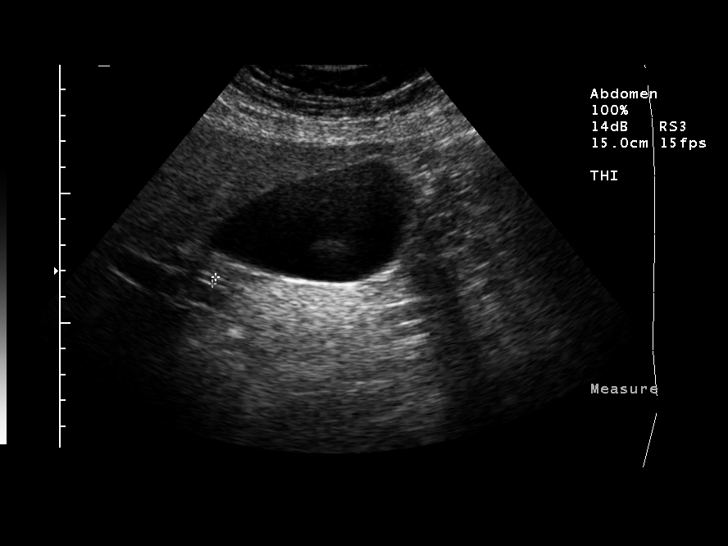
[im 31/57]
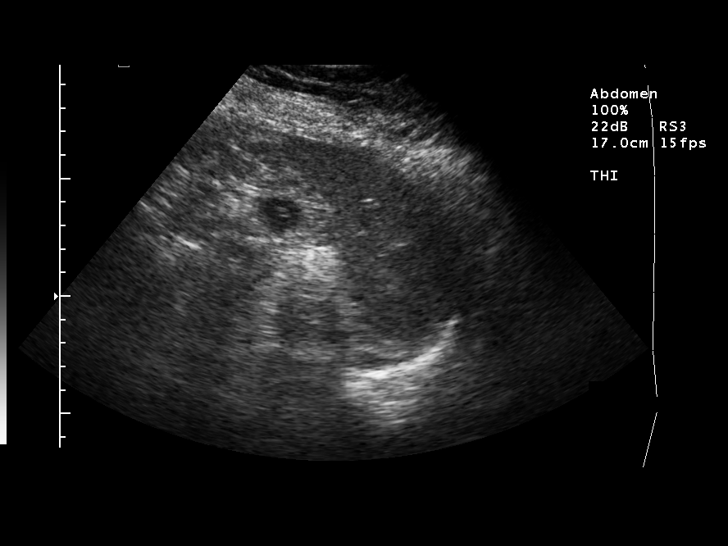
[im 36/57]
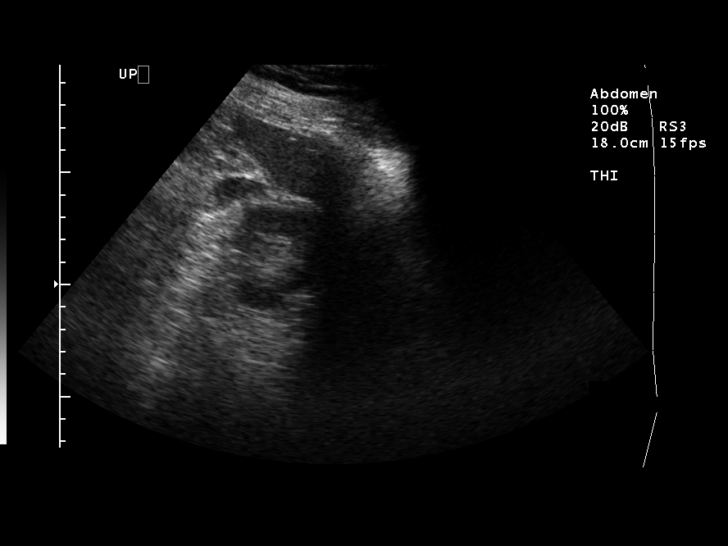
[im 38/57]
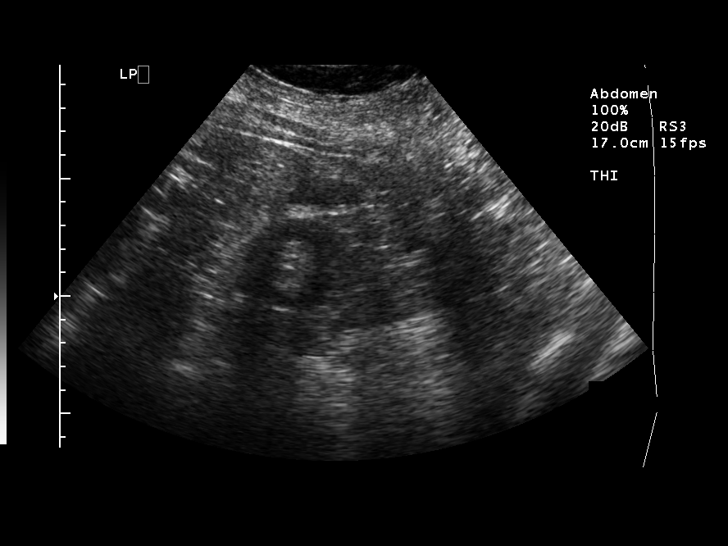
[im 43/57]
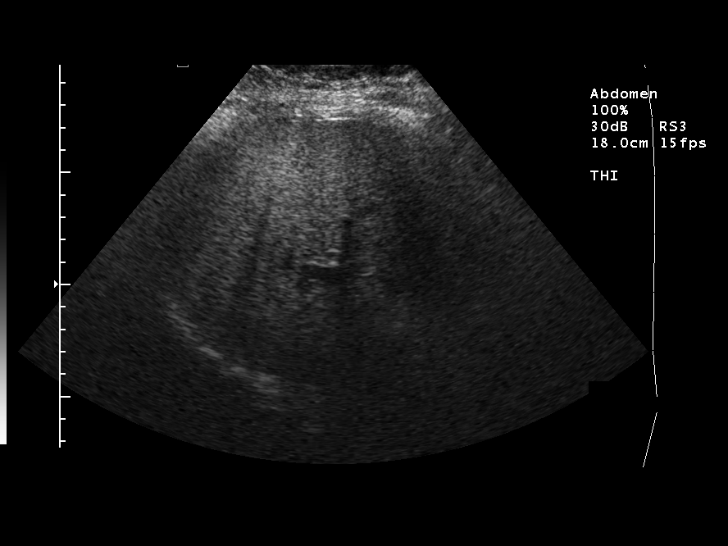
[im 47/57]
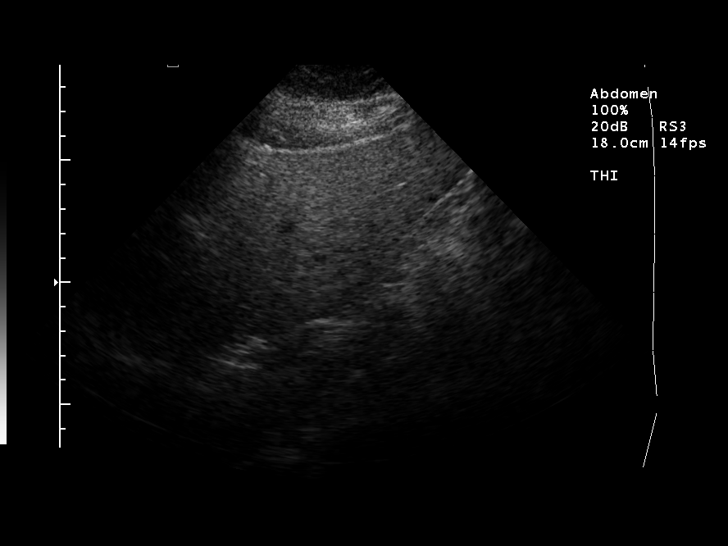
[im 52/57]
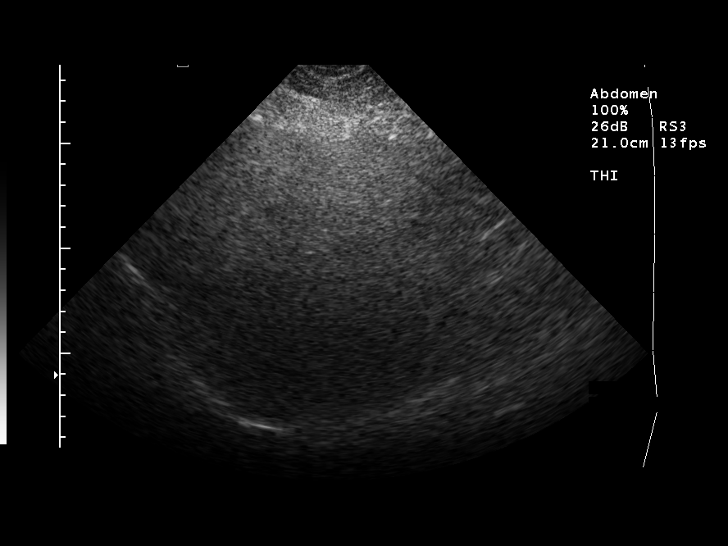
[im 57/57]
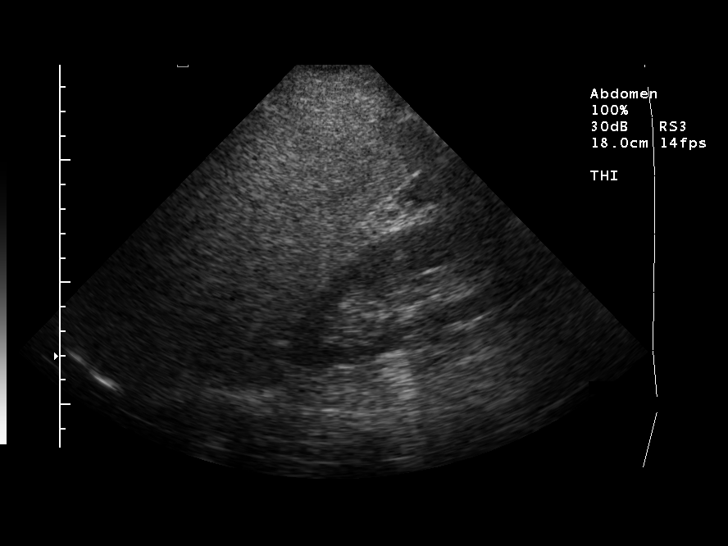

[14 of 25 positions shown; findings below may reference images not displayed]

FINDINGS: There is an approximate 1.8-2.0 cm gallstone.  Gallbladder wall thickness is 1.8 mm.  No gallbladder wall edema or free pericholecystic fluid.   Findings compatible with mild diffuse fatty infiltration of the liver.   The common duct measures 2.8 mm which is well within normal limits.   No pancreatic, splenic, or renal abnormality.   The spleen measures 9.8 cm in length.   The right and left kidneys measure 10.4 cm and 11.6 cm in length, respectively.   Maximum diameter of abdominal aorta is 2.1 cm.   Patent IVC.
IMPRESSION: Cholelithiasis.   No biliary ductal dilatation.   Fatty infiltration of the liver.

## 2007-04-19 IMAGING — NM NM LIVER FUNCTION STUDY
1 series · 6 of 6 positions shown · non-contrast
Comparison: None.

CLINICAL DATA: Abdominal pain/gallstones.
 NUCLEAR MEDICINE HEPATOBILIARY LIVER FUNCTION STUDY:
TECHNIQUE: Sequential abdominal images were obtained for approximately 60 minutes following intravenous injection of radiopharmaceutical.
 Radiopharmaceutical:  5 mCi [PZ] Choletec.  Patient was given 4.2 mg of morphine sulfate intravenously.

[Series 1: he hepato · 4.23mm/px · 6 of 31 frames shown]
[frame 3/31]
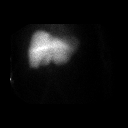
[frame 8/31]
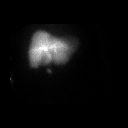
[frame 13/31]
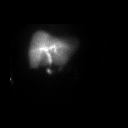
[frame 18/31]
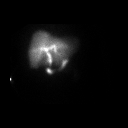
[frame 23/31]
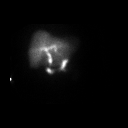
[frame 29/31]
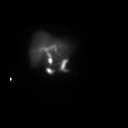

[6 of 6 positions shown; findings below may reference images not displayed]

FINDINGS: On the initial set of images out to one hour, there is isotope in the biliary tree and small bowel, but not in the gallbladder.  However, after intravenous morphine sulfate (4.2 mg), the gallbladder filled promptly.  This proves patency of the cystic duct.
IMPRESSION: 1.  Patent common duct.
 2.  Cystic duct patency demonstrated after IV morphine sulfate.

## 2007-06-22 ENCOUNTER — Encounter: Admission: RE | Admit: 2007-06-22 | Discharge: 2007-07-28 | Payer: Self-pay | Admitting: Orthopedic Surgery

## 2008-02-03 ENCOUNTER — Encounter: Admission: RE | Admit: 2008-02-03 | Discharge: 2008-03-15 | Payer: Self-pay | Admitting: Orthopedic Surgery

## 2008-11-22 ENCOUNTER — Encounter: Admission: RE | Admit: 2008-11-22 | Discharge: 2008-12-28 | Payer: Self-pay | Admitting: Orthopedic Surgery

## 2010-01-22 ENCOUNTER — Ambulatory Visit (HOSPITAL_BASED_OUTPATIENT_CLINIC_OR_DEPARTMENT_OTHER): Admission: RE | Admit: 2010-01-22 | Discharge: 2010-01-22 | Payer: Self-pay | Admitting: Orthopedic Surgery

## 2010-01-22 HISTORY — PX: KNEE ARTHROSCOPY: SHX127

## 2010-12-10 NOTE — H&P (Signed)
Natalie [name], SCHIRO                ACCOUNT NO.:  [id number]   MEDICAL RECORD NO.:  [id number]          PATIENT TYPE:  INP   LOCATION:  1316                         FACILITY:  [facility]   PHYSICIAN:  Adolph Pollack, M.D.DATE OF BIRTH:  June 29, 1961   DATE OF ADMISSION:  04/18/2007  DATE OF DISCHARGE:                              HISTORY & PHYSICAL   CHIEF COMPLAINT:  Diarrhea, nausea, vomiting, followed by abdominal  pain.   HISTORY OF PRESENT ILLNESS:  This is a 50 year old female who went to  bed last night, but prior to that she had some diarrhea.  She awoke this  morning with nausea and vomiting and then had abdominal pain following  that.  The pain was in the epigastrium and was sharp with no radiation.  She came to the emergency department to be evaluated at approximately  6:30 this morning.  She was given some pain medication and some nausea  medicine, and this seemed to help with her discomfort.  She underwent  evaluation and was noted to have a leukocytosis of 21,700 with normal  hemoglobin.  She had very mild elevation of her AST 43 and ALT of 47.  Lipase was normal.  Urinalysis negative.  An ultrasound was eventually  performed and showed gallstones, but no wall thickening.  No  pericholecystic fluid.  CT scan demonstrate no acute inflammatory  changes.  There was some concern whether this may be related to  gallbladder disease, and I was asked to see her.  No jaundice.  She did  have fever in the emergency department.   PAST MEDICAL HISTORY:  1. Endometriosis.  2. Nephrolithiasis.  3. Hypercholesterolemia.  4. Gastroesophageal reflux disease.  5. Seasonal allergies.  6. Migraine headaches.  7. Ocular artery clot.   PREVIOUS OPERATIONS:  1. Cesarean section.  2. Abdominal hysterectomy with left salpingo-oophorectomy.  3. Two laparoscopies.   ALLERGIES:  None.   MEDICATIONS:  Lexapro, Allegra, Nexium, aspirin, Crestor, Vivelle-Dot.   SOCIAL HISTORY:  She is  married.  Former smoker.  No alcohol use.  Has a  16 year old son.   FAMILY HISTORY:  Notable for hypertension.   REVIEW OF SYSTEMS:  GENERAL:  No weight loss or gain recently.  CARDIOVASCULAR:  No hypertension, heart disease.  PULMONARY:  No  pneumonia, asthma, COPD or torulosis.  GI:  No peptic ulcer disease,  hepatitis, colitis, diverticulitis.  Does have known hemorrhoids.  GU:  She states she has kidney stones, but no dysuria.  ENDOCRINE:  No  diabetes, thyroid problems.  NEUROLOGIC:  No strokes or seizures.  HEMATOLOGIC:  No bleeding disorders or transfusions.   PHYSICAL EXAMINATION:  GENERAL:  An obese female, appears to be somewhat  ill.  VITAL SIGNS:  Temperature is 100.7, blood pressure is 104/52, pulse 93,  respiratory rate 18.  O2 sats 96-98% on room air.  HEENT:  Normocephalic, atraumatic.  Extraocular motions intact.  No  icterus.  NECK:  Supple without obvious masses.  RESPIRATORY:  Breath sounds equal and clear.  Respirations unlabored.  CARDIOVASCULAR:  Regular rate, regular rhythm.  No murmur.  No lower  extremity edema.  ABDOMEN:  Soft with a lower transverse scar and subumbilical scar.  There is some mild tenderness in the suprapubic epigastric right upper  quadrant regions, but no guarding or no masses.  Few bowel sounds are  heard.  MUSCULOSKELETAL:  Good range of motion.  SKIN:  No jaundice.   IMPRESSION:  Diarrhea followed by nausea, vomiting and abdominal pain -  differential includes a gastroenteritis versus a possible cholecystitis.   PLAN:  Will admit, start her on empiric antibiotics and hydrate with IV  fluids.  Obtain a HIDA scan in the morning and decide if she needs to  have a cholecystectomy or if this is more related to a viral phenomenon.      Adolph Pollack, M.D.  Electronically Signed     TJR/MEDQ  D:  04/18/2007  T:  04/19/2007  Job:  [id number]

## 2010-12-13 NOTE — Discharge Summary (Signed)
   NAME:  Natalie [name], Natalie [name]                          ACCOUNT NO.:  [id number]   MEDICAL RECORD NO.:  [id number]                   PATIENT TYPE:  INP   LOCATION:  9310                                 FACILITY:  [facility]   PHYSICIAN:  Guy Sandifer. Arleta Creek, M.D.           DATE OF BIRTH:  1960/10/25   DATE OF ADMISSION:  03/31/2002  DATE OF DISCHARGE:  04/02/2002                                 DISCHARGE SUMMARY   ADMISSION DIAGNOSIS:  Uterine endometriosis.   DISCHARGE DIAGNOSIS:  Uterine endometriosis.   PROCEDURE:  On March 31, 2002 total abdominal hysterectomy with left  salpingo-oophorectomy.   REASON FOR ADMISSION:  The patient is a 50 year old married white female G1,  P1 status post tubal ligation with nonsevere endometriosis and secondary  symptoms.  Details are dictated in the History and Physical.  The patient is  admitted for surgical management.   HOSPITAL COURSE:  The patient is admitted to the hospital and undergoes the  above procedure.  Estimated blood loss is 250 cc.  On the evening of  surgery, she has good pain control, stable vital signs, and she is afebrile  with clear urine output.  On the first postoperative day, she has passed  some flatus, is tolerating a liquid diet, remains afebrile.  White count is  7.3, hemoglobin 10.5, platelet count 256,000.  On the day of discharge, she  is ambulating well, tolerating regular diet.  Incision is healing well.  Pathology is pending.   CONDITION ON DISCHARGE:  Good.   DIET:  Regular as tolerated.   ACTIVITY:  No vaginal entry.  No lifting or operation of automobile.   DISCHARGE INSTRUCTIONS:  She is to call the office with problems including  but not limited to temperature of 101 or greater, persistent nausea or  vomiting, increasing pain, or heavy vaginal bleeding.   MEDICATIONS:  1. Percocet 5/325 mg dispense #30 one to two p.o. q.6 h. p.r.n.  2. Ibuprofen 600 mg p.o. q.6 h. p.r.n.  3. Multivitamin daily.   FOLLOW  UP:  In the office in two weeks.                                               Guy Sandifer Arleta Creek, M.D.    JET/MEDQ  D:  04/02/2002  T:  04/02/2002  Job:  [id number]

## 2010-12-13 NOTE — Op Note (Signed)
NAME:  Natalie [name], Natalie [name]                          ACCOUNT NO.:  [id number]   MEDICAL RECORD NO.:  [id number]                   PATIENT TYPE:  INP   LOCATION:  9399                                 FACILITY:  [facility]   PHYSICIAN:  Guy Sandifer. Arleta Creek, M.D.           DATE OF BIRTH:  [birth year]   DATE OF PROCEDURE:  03/31/2002  DATE OF DISCHARGE:                                 OPERATIVE REPORT   PREOPERATIVE DIAGNOSES:  Severe endometriosis.   POSTOPERATIVE DIAGNOSES:  Severe endometriosis.   PROCEDURE:  Total abdominal hysterectomy with left salpingo-oophorectomy.   SURGEON:  Guy Sandifer. Henderson Cloud, M.D.   ASSISTANTStann Mainland. Vincente Poli, M.D.   ANESTHESIA:  General with endotracheal intubation.   ESTIMATED BLOOD LOSS:  250 cc.   INDICATIONS AND CONSENT:  The patient is a 50 year old married white female  G1, P1 status post tubal ligation with known severe endometriosis and  continued symptoms for total abdominal hysterectomy and removal of left tube  and ovary.  Potential risks and complications have been discussed with the  patient preoperatively including, but not limited to, infection, bowel,  bladder, ureteral damage, bleeding requiring transfusion of blood products  with possible transfusion reaction, HIV and hepatitis acquisition, DVT, PE,  pneumonia, postoperative dyspareunia, fistula formation, and postoperative  pain and recurrence of endometriosis.  All questions have been answered and  consent is signed and on the chart.   FINDINGS:  The fundus is adherent to the left uterosacral ligament.  The  left ovary is also adherent to the left pelvic side wall.  Right tube and  ovary are normal.  Tubes are status post Filshie clip application  bilaterally.  Uterus is normal in size.  Anterior cul-de-sac is clean.   PROCEDURE:  The patient is taken to the operating room, placed in a dorsal  supine position where general anesthesia is induced with endotracheal  intubation.  She is then  prepped abdominally and vaginally.  Foley catheter  is placed in the bladder as a drain and she is draped in a sterile fashion.  Skin is entered through the Pfannenstiel scar and dissection is carried out  in layers to the peritoneum.  The peritoneum is incised and extended  superiorly and inferiorly.  The O'Sullivan-O-Connor retractor is placed.  The bowel is packed away and __________  blade is placed.  The uterus and  left tube and ovary are elevated without difficulty with blunt dissection.  Kelly clamps are placed in the proximal ligaments bilaterally.  The left  round ligament is taken down with the LigaSure instrument.  Left round  ligament is then transected.  Anterior leaf of the broad ligament is taken  down sharply.  Posterior leaf is bluntly perforated.  The tube and ovary are  well elevated, clear of the pelvic side wall.  The left infundibulopelvic  ligament is taken down with the LigaSure instrument as well.  The left  uterine artery is skeletonized and also taken down with the LigaSure.  The  right round ligament is taken down in a similar fashion.  The proximal  ligaments are taken down with LigaSure to retain the right ovary.  The  section of the fallopian tube with the Filshie clip is removed with the  specimen.  The anterior leaf of the broad ligament is taken down.  The  bladder flap is advanced and the right uterine artery is taken down with the  LigaSure.  A single bite is used bilaterally with the LigaSure to take down  the cardinal ligaments.  Then, using straight Heaney clamps the remainder of  the cardinal ligament is taken bilaterally.  Curved Heaneys are used to take  down the uterosacral ligaments bilaterally.  The left vaginal fornix is then  entered with a curved Heaney clamp.  A similar procedure is carried out on  the right.  The specimen is cut free.  Inspection reveals total removal of  the cervix.  The cuff is closed with figure-of-eight PDS sutures.   All  sutures PDS unless otherwise designated.  Good hemostasis is obtained.  The  right ovary is suspended from the right round ligament pedicle.  The sutures  on the vaginal angles bilaterally are tied in the midline to approximate the  uterosacral ligaments.  Copious irrigation is carried out and all returns is  clear.  The packs are removed and the anterior peritoneum is closed.  I am  informed that the count is incorrect and that a retractor is short.  The  nurse feels that she probably counted the retractor twice in two different  locations on the preoperative count.  However, the anterior rectus fascia is  closed in a running fashion with 0 PDS suture.  The skin is closed with  clips.  An abdominal x-ray is obtained prior to awakening the patient.  The  x-ray is negative for any retained instruments.  The patient is awakened,  taken to recovery room in stable condition.                                               Guy Sandifer Arleta Creek, M.D.    JET/MEDQ  D:  03/31/2002  T:  03/31/2002  Job:  [id number]

## 2010-12-13 NOTE — H&P (Signed)
   NAME:  Natalie [name], Natalie [name]                          ACCOUNT NO.:  [id number]   MEDICAL RECORD NO.:  [id number]                   PATIENT TYPE:  INP   LOCATION:  NA                                   FACILITY:  [facility]   PHYSICIAN:  Guy Sandifer. Arleta Creek, M.D.           DATE OF BIRTH:  06-28-1961   DATE OF ADMISSION:  03/31/2002  DATE OF DISCHARGE:                                HISTORY & PHYSICAL   CHIEF COMPLAINT:  Endometriosis.   HISTORY OF PRESENT ILLNESS:  This patient is a 50 year old married female  G1, P1 with known severe endometriosis who is status post laparoscopy with  application of Filshie clips and ablation of endometriosis on January 19, 2002.  After careful consideration of the options of management of her  endometriosis she is being admitted for total abdominal hysterectomy with  left salpingo-oophorectomy.  Potential risks and complications have been  discussed preoperatively.   PAST MEDICAL HISTORY:  1. Esophageal reflux.  2. Hemorrhoids.  3. Migraine headaches.  4. Infarction of the left retina.  5. Endometriosis as above.  6. Cervical dysplasia.   PAST SURGICAL HISTORY:  1. Laparoscopy as above.  2. Laparoscopy with hysteroscopy, D&C in 1995.   OBSTETRIC HISTORY:  Cesarean section x1.   FAMILY HISTORY:  Positive for diabetes maternal grandfather, chronic  hypertension maternal grandfather and maternal grandmother, coronary artery  disease in paternal grandfather.   MEDICATIONS:  1. Lexapro 10 mg daily.  2. Benadryl.  3. Advil.  4. Afrin p.r.n.   ALLERGIES:  No known drug allergies.   SOCIAL HISTORY:  The patient denies tobacco, alcohol, or drug abuse.   REVIEW OF SYMPTOMS:  Negative except as above.   PHYSICAL EXAMINATION:  VITAL SIGNS:  Height 5 feet 1.5 inches, weight 171  pounds, blood pressure 110/70.  HEENT:  Without thyromegaly.  LUNGS:  Clear to auscultation.  HEART:  Regular rhythm.  BACK:  Without CVA tenderness.  BREASTS:  Without masses,  tracts, or discharge.  ABDOMEN:  Soft, nontender without mass.  PELVIC:  Vulva, vagina, cervix without lesion.  Uterus is normal size and  nontender.  Adnexa nontender without masses.  EXTREMITIES:  Grossly within normal limits.  NEUROLOGIC:  Grossly within normal limits.   ASSESSMENT:  Severe endometriosis.   PLAN:  Total abdominal hysterectomy and left salpingo-oophorectomy.                                               Guy Sandifer Arleta Creek, M.D.    JET/MEDQ  D:  03/30/2002  T:  03/30/2002  Job:  [id number]

## 2010-12-13 NOTE — Op Note (Signed)
NAME:  Natalie [name], Natalie [name]                          ACCOUNT NO.:  [id number]   MEDICAL RECORD NO.:  [id number]                   PATIENT TYPE:  AMB   LOCATION:  DSC                                  FACILITY:  [facility]   PHYSICIAN:  Feliberto Gottron. Turner Daniels, M.D.                DATE OF BIRTH:  [birth year]   DATE OF PROCEDURE:  06/14/2003  DATE OF DISCHARGE:                                 OPERATIVE REPORT   PREOPERATIVE DIAGNOSIS:  Chondromalacia of radial head, right elbow.   POSTOPERATIVE DIAGNOSIS:  Chondromalacia of radial head, right elbow.   PROCEDURE:  Debridement grade IV chondromalacia focal of the right radial  head.   SURGEON:  Feliberto Gottron. Turner Daniels, M.D.   ASSISTANT:  Skip Mayer, P. A.-C.   ANESTHESIA:  General.   ESTIMATED BLOOD LOSS:  Minimal.   FLUID REPLACEMENT:  600 mL crystalloid.   DRAINS PLACED:  None.   TOURNIQUET TIME:  None.   INDICATIONS:  This 50 year old woman is followed for right elbow pain with  insidious onset.  MRI scan was positive for possible chondromalacia of the  radial head.  She had a good temporary response to the cortisone injection  and now desires elective arthroscopic evaluation and treatment of her right  elbow having failed conservative treatment with the cortisone injection,  anti-inflammatory medicines and observation.   DESCRIPTION OF PROCEDURE:  The patient was identified by arm band, taken to  the operating room at [facility].  Appropriate anesthetic  monitors were attached.  General endotracheal anesthesia was induced.  With  the patient in the supine position, she was then rolled into the left  lateral decubitus position, fixed there with a bean bag.  An elbow scope  holder was then placed underneath the arm where she had a tourniquet applied  but not used and the right upper extremity prepped and draped in the usual  sterile fashion from the wrist to the mid humerus level.  Using an 18 gauge  needle and an anterolateral approach  we then filled the elbow with 0.25%  Marcaine and epinephrine solution and made a standard anterior lateral  portal with a #11 blade to about half depth.  The 2.9 scope was then placed  into the joint and visualization revealed a delamination flap type tear of  the articular cartilage from the anterior medial, posterior medial edge of  the radial head, not so much on the articular surface with the capitellum  but more along the articular surface with the notch in the ulna.  A standard  anterior medial portal was then placed under arthroscopic visualization and  a 2.9 great white sucker shaver brought in and debridement of the articular  cartilage flap tear was started and finished when we swapped portals  bringing the scope in medially and the sucker shaver in laterally.  We then  placed a standard posterior lateral portal and diagnostic  arthroscopy of the  olecranon fossa.  The olecranon itself and the articular cartilage of the  trochlea was in good condition.  Using the posterior lateral portal and a  supplemental posterior lateral portal we then completed the debridement of  the flap tear that went down to bear bone along the radial head.  Satisfied  with the debridement, the elbow was then washed out with normal saline  solution.  The arthroscopic instruments were removed.  Dressing and  Xeroform, 4 x 4 dressings, sponges, Webril, and an Ace wrap were applied.  The patient was then awakened and taken to the recovery room without  difficulty.                                               Feliberto Gottron. Turner Daniels, M.D.    Ovid Curd  D:  06/14/2003  T:  06/15/2003  Job:  [id number]

## 2011-03-07 IMAGING — MG DG ABDOMEN 1V
4 series · 4 of 4 positions shown · non-contrast
Comparison: none

[R CC]
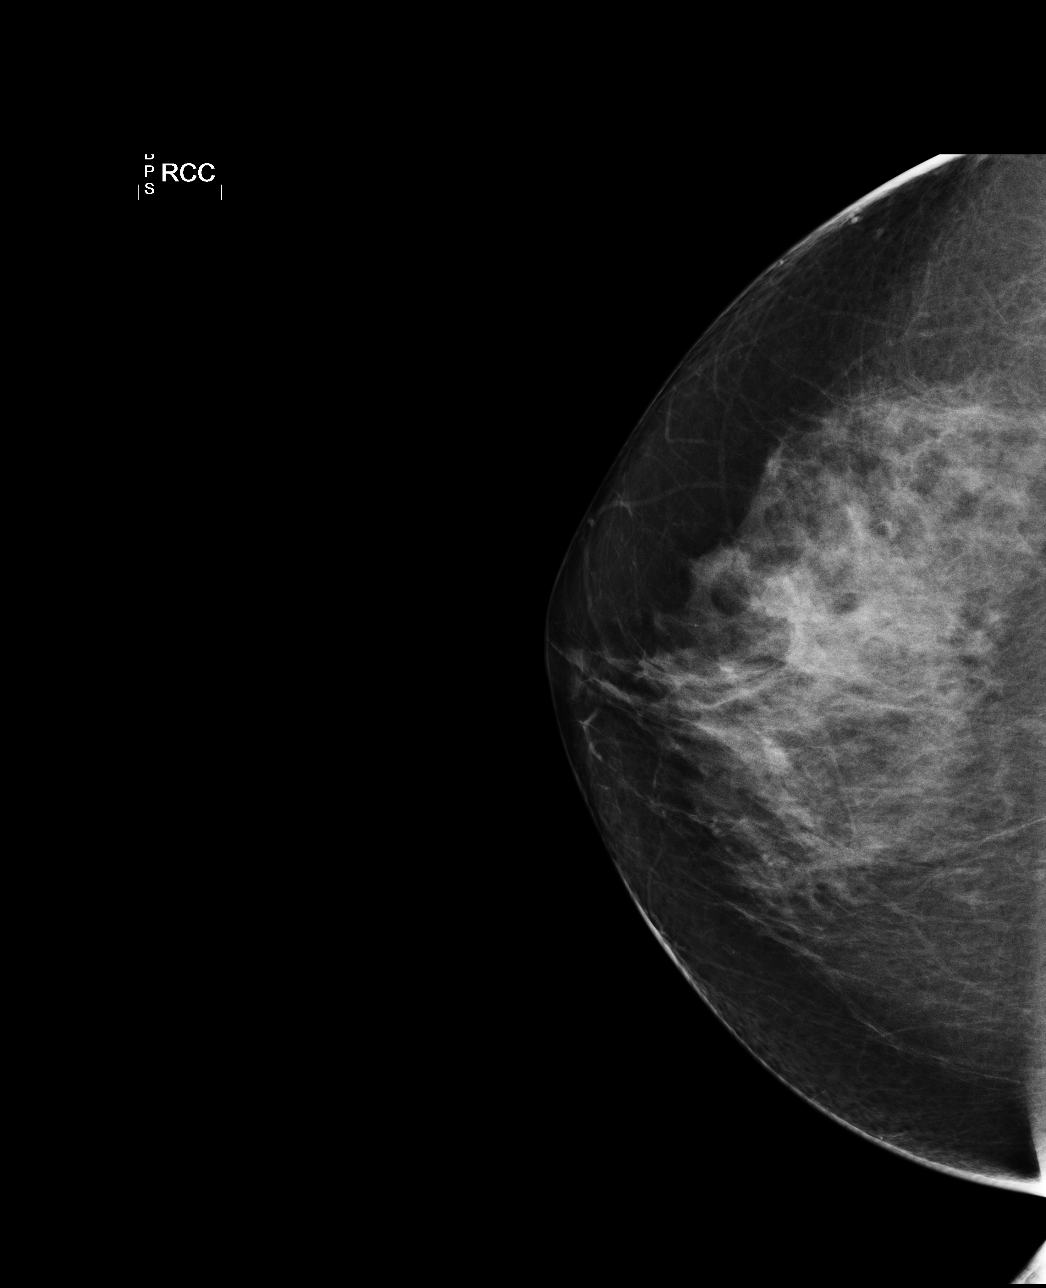

[L CC]
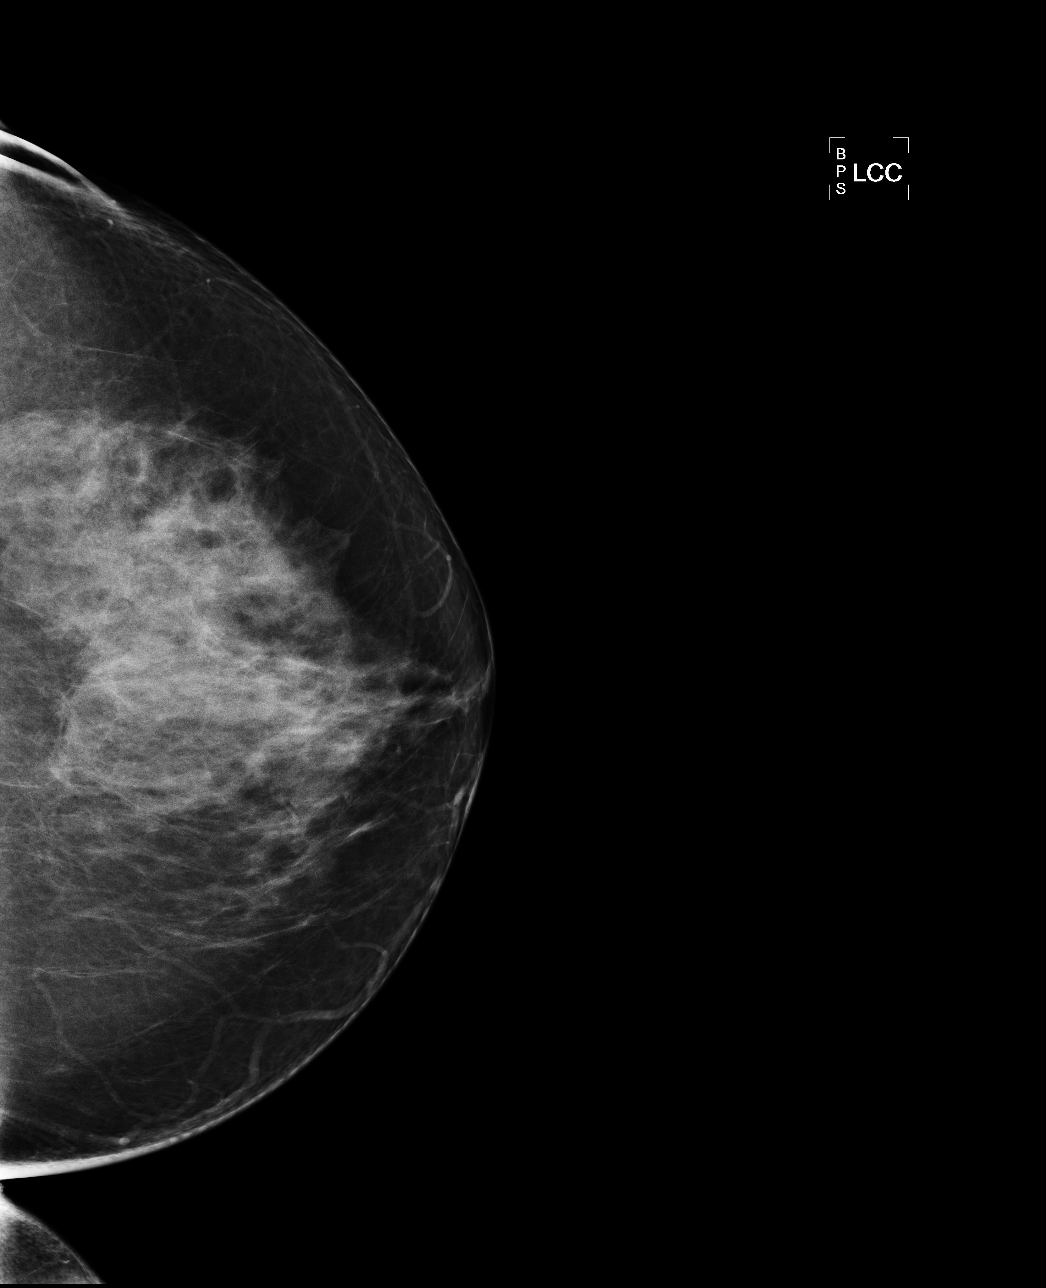

[L MLO]
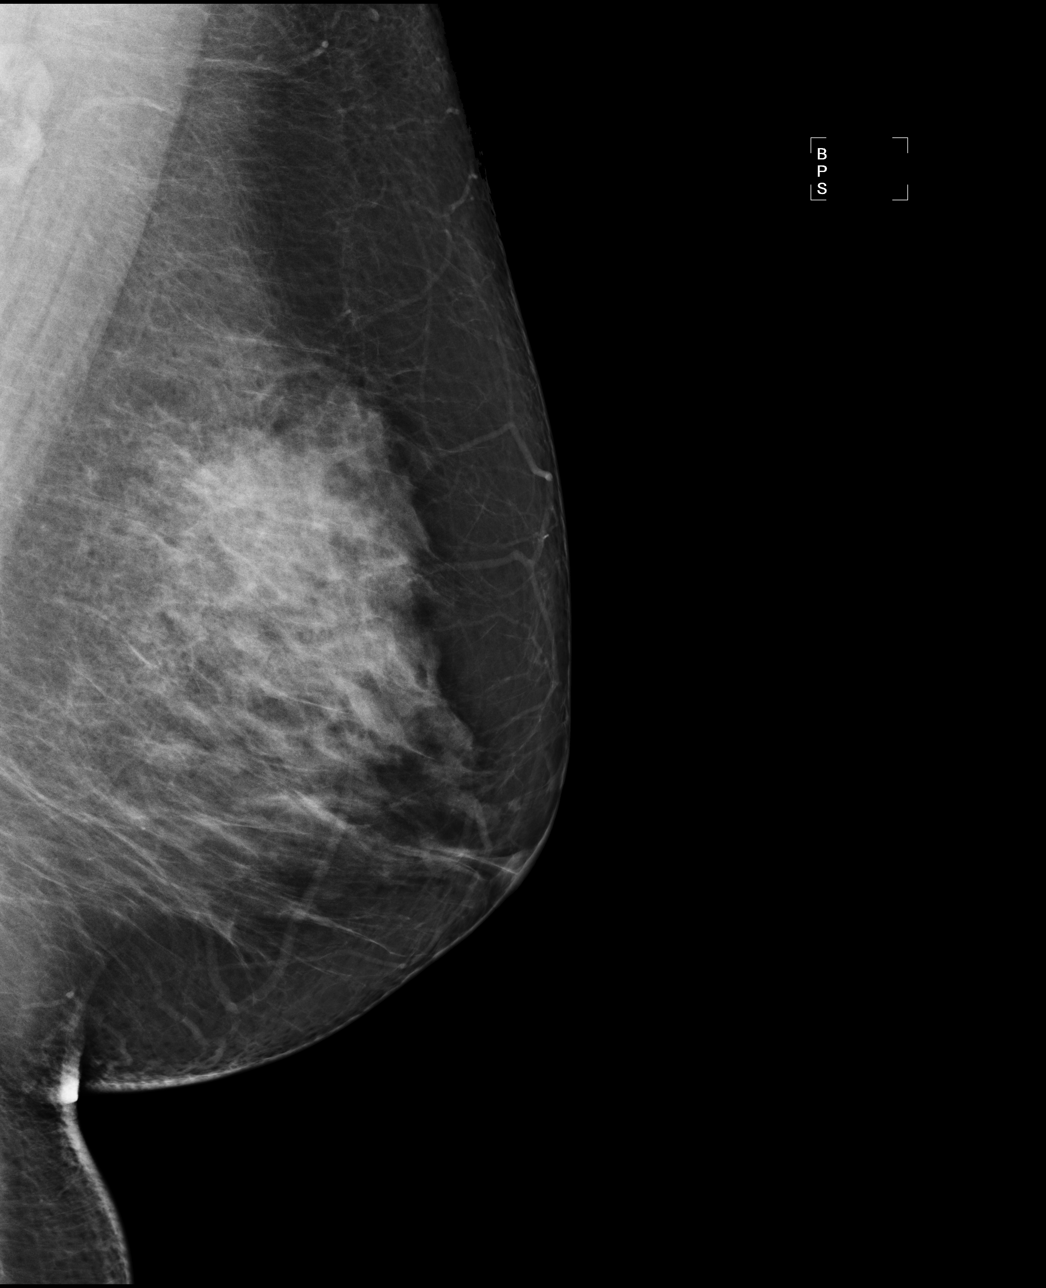

[R MLO]
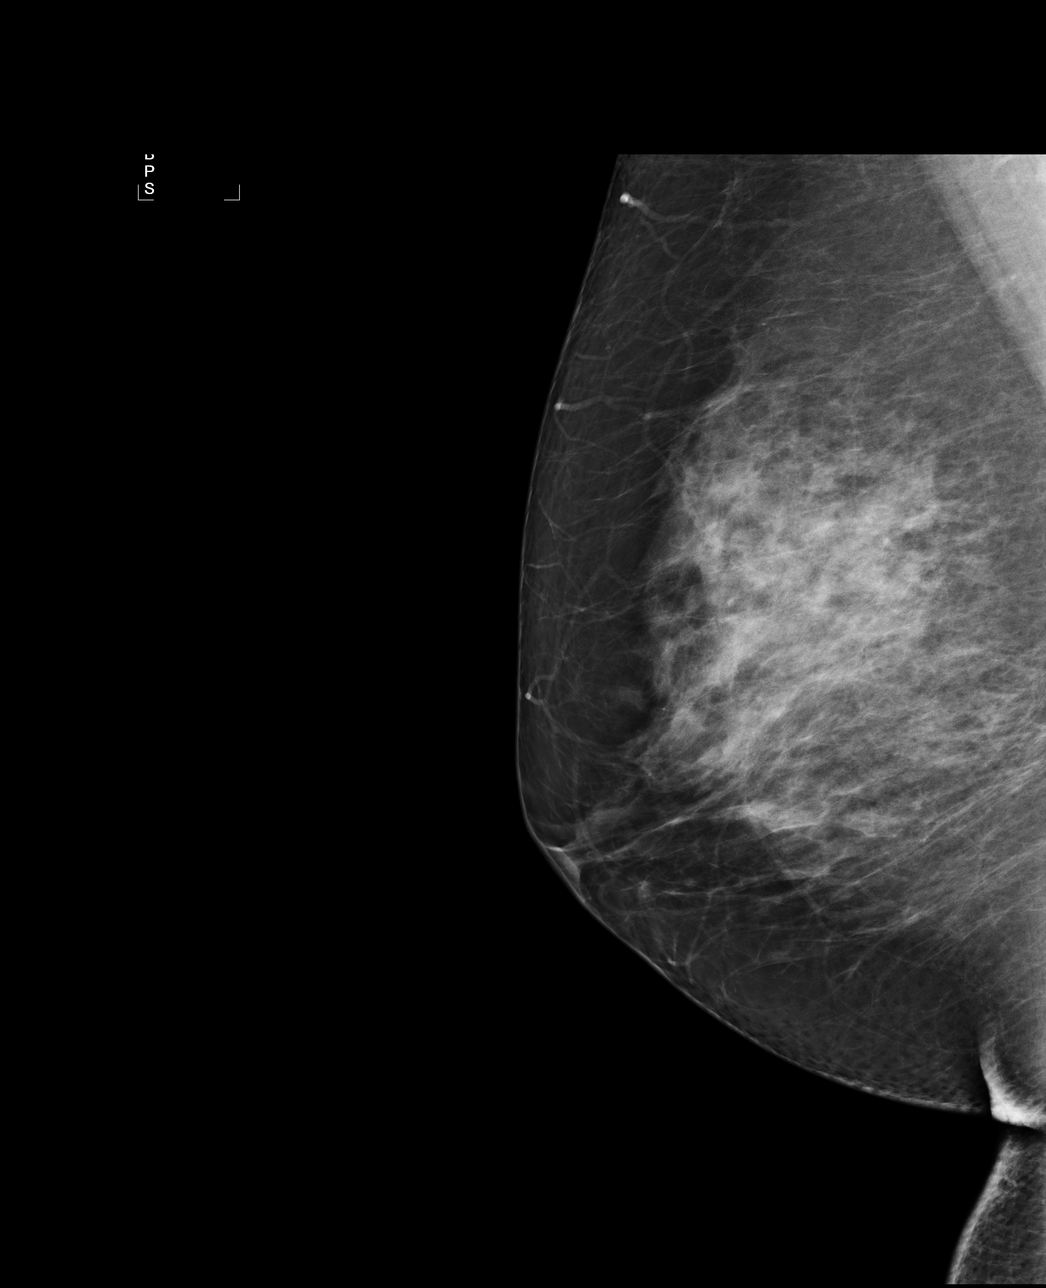

[4 of 4 positions shown; findings below may reference images not displayed]

Canned report from images found in remote index.

Refer to host system for actual result text.

## 2011-05-08 LAB — COMPREHENSIVE METABOLIC PANEL
ALT: 58 — ABNORMAL HIGH
AST: 38 — ABNORMAL HIGH
Albumin: 3.1 — ABNORMAL LOW
Alkaline Phosphatase: 74
Alkaline Phosphatase: 96
BUN: 13
BUN: 4 — ABNORMAL LOW
CO2: 25
Calcium: 8 — ABNORMAL LOW
Calcium: 9.1
Chloride: 106
Creatinine, Ser: 0.61
GFR calc Af Amer: >60
GFR calc non Af Amer: >60
GFR calc non Af Amer: >60
Glucose, Bld: 131 — ABNORMAL HIGH
Glucose, Bld: 142 — ABNORMAL HIGH
Potassium: 3.4 — ABNORMAL LOW
Sodium: 137
Total Bilirubin: 0.6
Total Protein: 6
Total Protein: 7.8

## 2011-05-08 LAB — CBC
HCT: 33.6 — ABNORMAL LOW
HCT: 34.4 — ABNORMAL LOW
HCT: 43.9
Hemoglobin: 11.2 — ABNORMAL LOW
Hemoglobin: 14.8
MCHC: 33.4
MCHC: 33.6
Platelets: 251
RBC: 3.82 — ABNORMAL LOW
RDW: 13.8
RDW: 13.9
RDW: 14
WBC: 8.9

## 2011-05-08 LAB — DIFFERENTIAL
Basophils Relative: 1
Lymphs Abs: 1.3
Monocytes Relative: 8
Neutro Abs: 18.6 — ABNORMAL HIGH
Neutrophils Relative %: 86 — ABNORMAL HIGH

## 2011-05-08 LAB — URINALYSIS, ROUTINE W REFLEX MICROSCOPIC
Glucose, UA: NEGATIVE
Nitrite: NEGATIVE
Protein, ur: NEGATIVE

## 2011-05-08 LAB — APTT: aPTT: 30

## 2011-05-08 LAB — LIPASE, BLOOD: Lipase: 25

## 2013-08-23 ENCOUNTER — Ambulatory Visit: Payer: BC Managed Care – PPO | Attending: Neurosurgery | Admitting: Physical Therapy

## 2013-08-23 DIAGNOSIS — M542 Cervicalgia: Secondary | ICD-10-CM | POA: Insufficient documentation

## 2013-08-23 DIAGNOSIS — M25519 Pain in unspecified shoulder: Secondary | ICD-10-CM | POA: Insufficient documentation

## 2013-08-23 DIAGNOSIS — M79609 Pain in unspecified limb: Secondary | ICD-10-CM | POA: Insufficient documentation

## 2013-08-23 DIAGNOSIS — M25539 Pain in unspecified wrist: Secondary | ICD-10-CM | POA: Insufficient documentation

## 2013-08-23 DIAGNOSIS — IMO0001 Reserved for inherently not codable concepts without codable children: Secondary | ICD-10-CM | POA: Insufficient documentation

## 2013-09-09 ENCOUNTER — Encounter: Payer: Self-pay | Admitting: *Deleted

## 2013-09-09 DIAGNOSIS — K219 Gastro-esophageal reflux disease without esophagitis: Secondary | ICD-10-CM | POA: Insufficient documentation

## 2013-09-09 DIAGNOSIS — N809 Endometriosis, unspecified: Secondary | ICD-10-CM | POA: Insufficient documentation

## 2013-09-09 DIAGNOSIS — E7800 Pure hypercholesterolemia, unspecified: Secondary | ICD-10-CM

## 2014-10-04 ENCOUNTER — Other Ambulatory Visit: Payer: Self-pay | Admitting: Obstetrics and Gynecology

## 2014-10-04 DIAGNOSIS — R928 Other abnormal and inconclusive findings on diagnostic imaging of breast: Secondary | ICD-10-CM

## 2014-10-09 ENCOUNTER — Ambulatory Visit
Admission: RE | Admit: 2014-10-09 | Discharge: 2014-10-09 | Disposition: A | Discharge status: Home / Self Care | Payer: PRIVATE HEALTH INSURANCE | Source: Ambulatory Visit | Attending: Obstetrics and Gynecology | Admitting: Obstetrics and Gynecology

## 2014-10-09 DIAGNOSIS — R928 Other abnormal and inconclusive findings on diagnostic imaging of breast: Secondary | ICD-10-CM

## 2014-10-09 IMAGING — US US BREAST LTD UNI RIGHT INC AXILLA
1 series · 4 of 4 positions shown · non-contrast
Comparison: With priors.

CLINICAL DATA: Patient was called back from screening mammogram for
a possible mass in the right breast.

EXAM:
DIGITAL DIAGNOSTIC RIGHT MAMMOGRAM WITH 3D TOMOSYNTHESIS WITH CAD
ULTRASOUND RIGHT BREAST

[Series 1: advbreast · 4 of 4 slices shown]
[im 1/4]
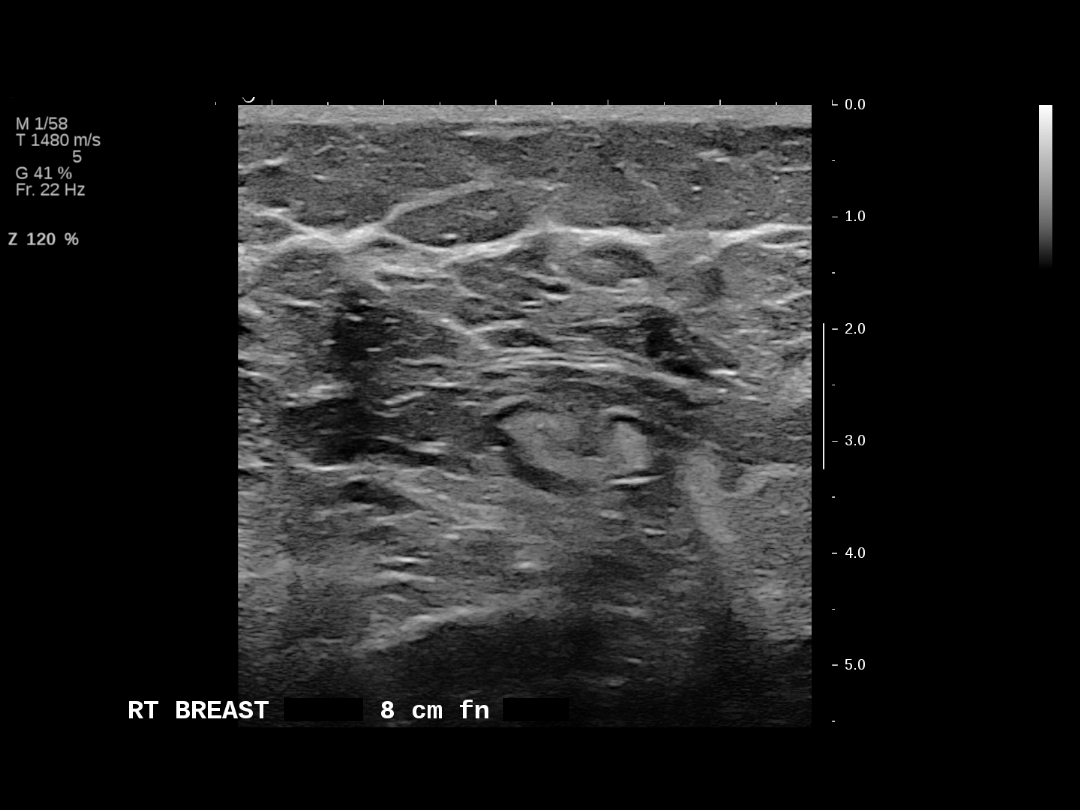
[im 2/4]
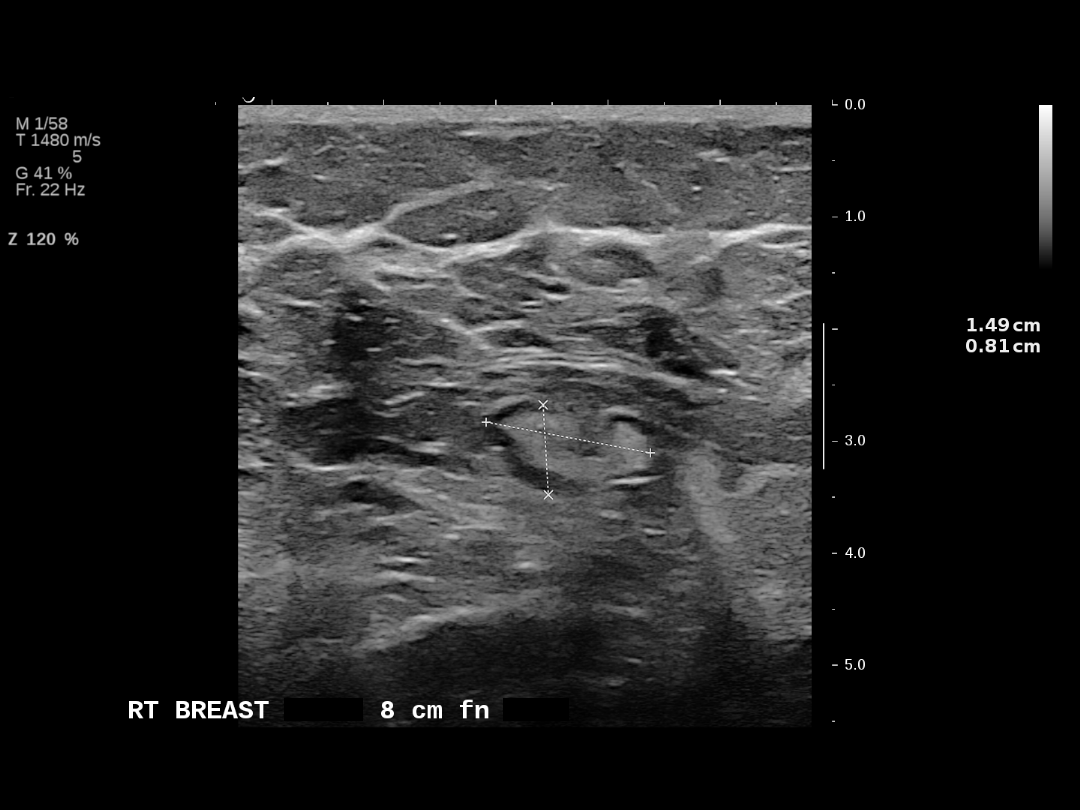
[im 3/4]
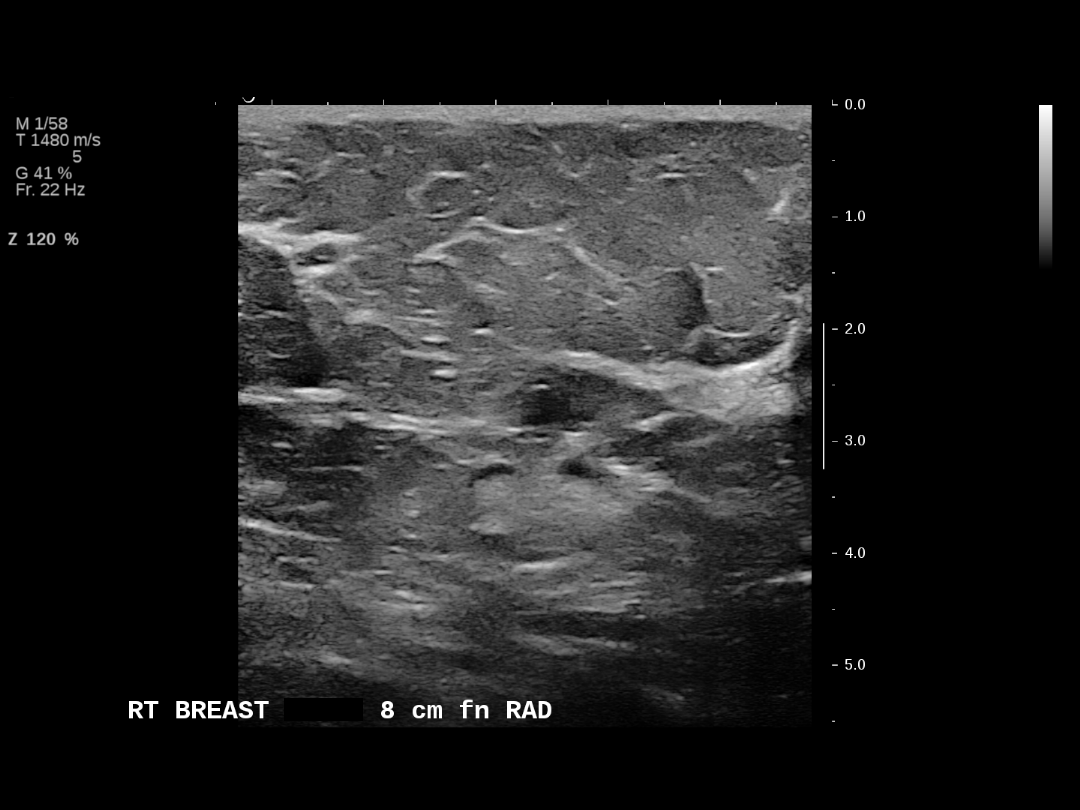
[im 4/4]
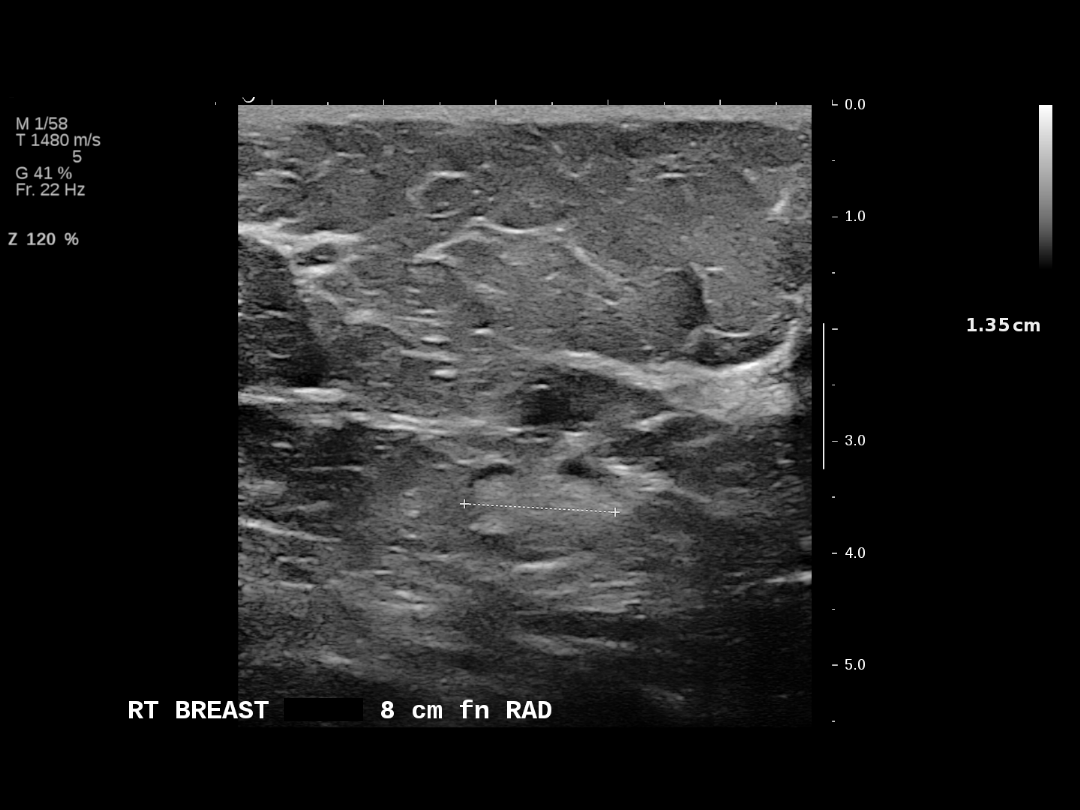

[4 of 4 positions shown; findings below may reference images not displayed]

ACR Breast Density Category c: The breast tissue is heterogeneously
dense, which may obscure small masses.
FINDINGS: Additional imaging of the right breast was performed. Far
posteriorly in the superior aspect of the right breast is a
well-circumscribed 6 mm nodule. It has central fat and likely is an
intramammary lymph node.

Mammographic images were processed with CAD.

On physical exam, I do not palpate a mass in the right breast.

Targeted ultrasound is performed, showing a benign lymph node in the
right breast several 11 o'clock 8 cm from the nipple measuring 1.5 x
0.8 x 1.4 cm. No enlarged axillary adenopathy is detected. No
additional lesions are seen throughout the upper aspect of the right
breast.
IMPRESSION: Probable benign intramammary lymph node in the right breast.

RECOMMENDATION:
Short-term interval followup right mammogram and possible ultrasound
in 6 months is recommended.

I have discussed the findings and recommendations with the patient.
Results were also provided in writing at the conclusion of the
visit. If applicable, a reminder letter will be sent to the patient
regarding the next appointment.

BI-RADS CATEGORY  3: Probably benign.

## 2015-02-06 DIAGNOSIS — K580 Irritable bowel syndrome with diarrhea: Secondary | ICD-10-CM | POA: Insufficient documentation

## 2015-02-16 ENCOUNTER — Encounter: Payer: Self-pay | Admitting: Gastroenterology

## 2015-04-13 ENCOUNTER — Ambulatory Visit (AMBULATORY_SURGERY_CENTER): Payer: Self-pay

## 2015-04-13 VITALS — Ht 62.0 in | Wt 208.0 lb

## 2015-04-13 DIAGNOSIS — Z1211 Encounter for screening for malignant neoplasm of colon: Secondary | ICD-10-CM

## 2015-04-13 NOTE — Progress Notes (Signed)
No allergies to eggs or soy No home oxygen No diet/weight loss meds No past problems with anesthesia  Has email  Emmi instructions given for colonoscopy 

## 2015-04-20 ENCOUNTER — Ambulatory Visit (AMBULATORY_SURGERY_CENTER): Payer: PRIVATE HEALTH INSURANCE | Admitting: Gastroenterology

## 2015-04-20 ENCOUNTER — Encounter: Payer: Self-pay | Admitting: Gastroenterology

## 2015-04-20 VITALS — BP 113/71 | HR 68 | Temp 98.8°F | Resp 16 | Ht 62.0 in | Wt 208.0 lb

## 2015-04-20 DIAGNOSIS — Z1211 Encounter for screening for malignant neoplasm of colon: Secondary | ICD-10-CM | POA: Diagnosis present

## 2015-04-20 HISTORY — PX: COLONOSCOPY WITH PROPOFOL: SHX5780

## 2015-04-20 MED ORDER — SODIUM CHLORIDE 0.9 % IV SOLN: 500.0000 mL | INTRAVENOUS | Status: DC

## 2015-04-20 NOTE — Patient Instructions (Signed)
YOU HAD AN ENDOSCOPIC PROCEDURE TODAY AT THE [facility]:   Refer to the procedure report that was given to you for any specific questions about what was found during the examination.  If the procedure report does not answer your questions, please call your gastroenterologist to clarify.  If you requested that your care partner not be given the details of your procedure findings, then the procedure report has been included in a sealed envelope for you to review at your convenience later.  YOU SHOULD EXPECT: Some feelings of bloating in the abdomen. Passage of more gas than usual.  Walking can help get rid of the air that was put into your GI tract during the procedure and reduce the bloating. If you had a lower endoscopy (such as a colonoscopy or flexible sigmoidoscopy) you may notice spotting of blood in your stool or on the toilet paper. If you underwent a bowel prep for your procedure, you may not have a normal bowel movement for a few days.  Please Note:  You might notice some irritation and congestion in your nose or some drainage.  This is from the oxygen used during your procedure.  There is no need for concern and it should clear up in a day or so.  SYMPTOMS TO REPORT IMMEDIATELY:   Following lower endoscopy (colonoscopy or flexible sigmoidoscopy):  Excessive amounts of blood in the stool  Significant tenderness or worsening of abdominal pains  Swelling of the abdomen that is new, acute  Fever of 100F or higher   For urgent or emergent issues, a gastroenterologist can be reached at any hour by calling [phone].   DIET: Your first meal following the procedure should be a small meal and then it is ok to progress to your normal diet. Heavy or fried foods are harder to digest and may make you feel nauseous or bloated.  Likewise, meals heavy in dairy and vegetables can increase bloating.  Drink plenty of fluids but you should avoid alcoholic beverages for 24  hours.  ACTIVITY:  You should plan to take it easy for the rest of today and you should NOT DRIVE or use heavy machinery until tomorrow (because of the sedation medicines used during the test).    FOLLOW UP: Our staff will call the number listed on your records the next business day following your procedure to check on you and address any questions or concerns that you may have regarding the information given to you following your procedure. If we do not reach you, we will leave a message.  However, if you are feeling well and you are not experiencing any problems, there is no need to return our call.  We will assume that you have returned to your regular daily activities without incident.  If any biopsies were taken you will be contacted by phone or by letter within the next 1-3 weeks.  Please call us at [phone] if you have not heard about the biopsies in 3 weeks.    SIGNATURES/CONFIDENTIALITY: You and/or your care partner have signed paperwork which will be entered into your electronic medical record.  These signatures attest to the fact that that the information above on your After Visit Summary has been reviewed and is understood.  Full responsibility of the confidentiality of this discharge information lies with you and/or your care-partner. 

## 2015-04-20 NOTE — Progress Notes (Signed)
Report to PACU, RN, vss, BBS= Clear.  

## 2015-04-20 NOTE — Op Note (Signed)
[facility] [address] [place], [address]   COLONOSCOPY PROCEDURE REPORT  PATIENT: Natalie [name], Natalie [name]  MR#: [id number] BIRTHDATE: 19-Jul-1961 , 54  yrs. old GENDER: female ENDOSCOPIST: Meryl Dare, MD, [facility] REFERRED BY: Virl Son, MD PROCEDURE DATE:  04/20/2015 PROCEDURE:   Colonoscopy, screening First Screening Colonoscopy - Avg.  risk and is 50 yrs.  old or older - No.  Prior Negative Screening - Now for repeat screening. 10 or more years since last screening  History of Adenoma - Now for follow-up colonoscopy & has been > or = to 3 yrs.  N/A  Polyps removed today? No Recommend repeat exam, <10 yrs? No ASA CLASS:   Class II INDICATIONS:Screening for colonic neoplasia and Colorectal Neoplasm Risk Assessment for this procedure is average risk. MEDICATIONS: Monitored anesthesia care and Propofol 300 mg IV DESCRIPTION OF PROCEDURE:   After the risks benefits and alternatives of the procedure were thoroughly explained, informed consent was obtained.  The digital rectal exam revealed no abnormalities of the rectum.   The LB [id number]-H190 [id number]  endoscope was introduced through the anus and advanced to the cecum, which was identified by both the appendix and ileocecal valve. No adverse events experienced.   The quality of the prep was good.  (Suprep was used)  The instrument was then slowly withdrawn as the colon was fully examined. Estimated blood loss is zero unless otherwise noted in this procedure report.    COLON FINDINGS: A normal appearing cecum, ileocecal valve, and appendiceal orifice were identified.  The ascending, transverse, descending, sigmoid colon, and rectum appeared unremarkable. Retroflexed views revealed internal Grade I hemorrhoids. The time to cecum = 2.4 Withdrawal time = 12.5   The scope was withdrawn and the procedure completed. COMPLICATIONS: There were no immediate complications.  ENDOSCOPIC IMPRESSION: 1.  Normal colonoscopy 2.   Grade I internal hemorrhoids  RECOMMENDATIONS: Continue current colorectal screening recommendations for "routine risk" patients with a repeat colonoscopy in 10 years.  eSigned:  Meryl Dare, MD, [facility] 04/20/2015 9:57 AM   [C

## 2015-04-23 ENCOUNTER — Telehealth: Payer: Self-pay | Admitting: *Deleted

## 2015-04-23 NOTE — Telephone Encounter (Signed)
  Follow up Call-  Call back number 04/20/2015  Post procedure Call Back phone  # [phone]  Permission to leave phone message Yes     No answer, left message.

## 2015-04-27 ENCOUNTER — Encounter: Payer: Self-pay | Admitting: Gastroenterology

## 2015-05-11 ENCOUNTER — Other Ambulatory Visit: Payer: Self-pay | Admitting: Obstetrics and Gynecology

## 2015-05-16 ENCOUNTER — Other Ambulatory Visit: Payer: Self-pay | Admitting: Obstetrics and Gynecology

## 2015-05-16 ENCOUNTER — Other Ambulatory Visit: Payer: Self-pay | Admitting: *Deleted

## 2015-05-16 ENCOUNTER — Inpatient Hospital Stay
Admission: RE | Admit: 2015-05-16 | Discharge: 2015-05-16 | Disposition: A | Discharge status: Home / Self Care | Payer: Self-pay | Source: Ambulatory Visit | Attending: *Deleted | Admitting: *Deleted

## 2015-05-16 DIAGNOSIS — Z9289 Personal history of other medical treatment: Secondary | ICD-10-CM

## 2015-05-16 DIAGNOSIS — N63 Unspecified lump in unspecified breast: Secondary | ICD-10-CM

## 2015-06-01 ENCOUNTER — Other Ambulatory Visit: Payer: Self-pay

## 2015-06-01 ENCOUNTER — Ambulatory Visit: Payer: PRIVATE HEALTH INSURANCE | Attending: Obstetrics and Gynecology

## 2015-10-05 ENCOUNTER — Telehealth: Payer: Self-pay | Admitting: Gastroenterology

## 2015-10-05 NOTE — Telephone Encounter (Signed)
Spoke with patient and explained no OV are available today. Suggested she call her PCP or go to Urgent Care. She will call her PCP.

## 2015-10-08 ENCOUNTER — Telehealth: Payer: Self-pay | Admitting: Gastroenterology

## 2015-10-08 NOTE — Telephone Encounter (Signed)
Patient reports that she is having abdominal pain.  She was evaluated by her primary care last week and started on amoxicillin for presumed diverticulitis.  She is still having the pain today, but it is slightly improved.  She has now developed diarrhea.  She is advised to come see Gay FillerLori Hvozdovic, PA on 10/12/15.  If her symptoms resolve on antibiotics she will cancel the appt.  She was also started on a pro-biotic on Friday.

## 2015-10-12 ENCOUNTER — Other Ambulatory Visit (INDEPENDENT_AMBULATORY_CARE_PROVIDER_SITE_OTHER): Payer: 59

## 2015-10-12 ENCOUNTER — Telehealth: Payer: Self-pay | Admitting: Physician Assistant

## 2015-10-12 ENCOUNTER — Other Ambulatory Visit: Payer: Self-pay

## 2015-10-12 ENCOUNTER — Ambulatory Visit (INDEPENDENT_AMBULATORY_CARE_PROVIDER_SITE_OTHER): Payer: 59 | Admitting: Physician Assistant

## 2015-10-12 ENCOUNTER — Encounter: Payer: Self-pay | Admitting: Physician Assistant

## 2015-10-12 VITALS — BP 120/74 | HR 80 | Ht 62.0 in | Wt 214.0 lb

## 2015-10-12 DIAGNOSIS — R1032 Left lower quadrant pain: Secondary | ICD-10-CM | POA: Diagnosis not present

## 2015-10-12 DIAGNOSIS — R197 Diarrhea, unspecified: Secondary | ICD-10-CM

## 2015-10-12 LAB — CBC WITH DIFFERENTIAL/PLATELET
BASOS ABS: 0 10*3/uL (ref 0.0–0.1)
Basophils Relative: 0.5 % (ref 0.0–3.0)
EOS PCT: 3.1 % (ref 0.0–5.0)
Eosinophils Absolute: 0.3 10*3/uL (ref 0.0–0.7)
HEMATOCRIT: 38 % (ref 36.0–46.0)
Hemoglobin: 12.5 g/dL (ref 12.0–15.0)
LYMPHS ABS: 2.4 10*3/uL (ref 0.7–4.0)
Lymphocytes Relative: 28.9 % (ref 12.0–46.0)
MCHC: 32.8 g/dL (ref 30.0–36.0)
MCV: 81.5 fl (ref 78.0–100.0)
Monocytes Absolute: 1 10*3/uL (ref 0.1–1.0)
Monocytes Relative: 11.3 % (ref 3.0–12.0)
NEUTROS PCT: 56.2 % (ref 43.0–77.0)
Neutro Abs: 4.7 10*3/uL (ref 1.4–7.7)
Platelets: 306 10*3/uL (ref 150.0–400.0)
RBC: 4.67 Mil/uL (ref 3.87–5.11)
RDW: 15.8 % — ABNORMAL HIGH (ref 11.5–15.5)
WBC: 8.4 10*3/uL (ref 4.0–10.5)

## 2015-10-12 LAB — BASIC METABOLIC PANEL
BUN: 8 mg/dL (ref 6–23)
CALCIUM: 9.3 mg/dL (ref 8.4–10.5)
CHLORIDE: 103 meq/L (ref 96–112)
CO2: 32 meq/L (ref 19–32)
CREATININE: 0.63 mg/dL (ref 0.40–1.20)
GFR: 103.63 mL/min (ref >60.00)
GLUCOSE: 97 mg/dL (ref 70–99)
Potassium: 4 mEq/L (ref 3.5–5.1)
Sodium: 141 mEq/L (ref 135–145)

## 2015-10-12 MED ORDER — METRONIDAZOLE 500 MG PO TABS: 500.0000 mg | ORAL_TABLET | Freq: Three times a day (TID) | ORAL | Status: DC

## 2015-10-12 NOTE — Patient Instructions (Signed)
You have been scheduled for a CT scan of the abdomen and pelvis at [facility].  You are scheduled on 10/12/15 at 2:30 pm. You should arrive 15 minutes prior to your appointment time for registration. Please follow the written instructions below on the day of your exam:  WARNING: IF YOU ARE ALLERGIC TO IODINE/X-RAY DYE, PLEASE NOTIFY RADIOLOGY IMMEDIATELY AT [phone]! YOU WILL BE GIVEN A 13 HOUR PREMEDICATION PREP.  1) Do not eat or drink anything after 9:30 am (4 hours prior to your test) 2) You have been given 2 bottles of oral contrast to drink. The solution may taste better if refrigerated, but do NOT add ice or any other liquid to this solution. Shake well before drinking.    Drink 1 bottle of contrast @ 12:30 pm (2 hours prior to your exam)  Drink 1 bottle of contrast @ 1:30 pm (1 hour prior to your exam)  You may take any medications as prescribed with a small amount of water except for the following: Metformin, Glucophage, Glucovance, Avandamet, Riomet, Fortamet, Actoplus Met, Janumet, Glumetza or Metaglip. The above medications must be held the day of the exam AND 48 hours after the exam.  The purpose of you drinking the oral contrast is to aid in the visualization of your intestinal tract. The contrast solution may cause some diarrhea. Before your exam is started, you will be given a small amount of fluid to drink. Depending on your individual set of symptoms, you may also receive an intravenous injection of x-ray contrast/dye. Plan on being at [facility] for 30 minutes or longer, depending on the type of exam you are having performed.  This test typically takes 30-45 minutes to complete.  If you have any questions regarding your exam or if you need to reschedule, you may call the CT department at [phone] between the hours of 8:00 am and 5:00 pm, Monday-Friday.  ________________________________________________________________________  Your physician has requested  that you go to the basement for lab work before leaving today.

## 2015-10-12 NOTE — Progress Notes (Signed)
Patient ID: Natalie [name] Narang, female   DOB: 02/19/1961, 55 y.o.   MRN: [id number]     History of Present Illness: Natalie [name] Medero is a pleasant 55 year old female who recently had a colonoscopy by Dr. Russella DarStark on 04/20/2015. This was done for screening purposes. It was a normal colonoscopy and she was noted to have grade 1 internal hemorrhoids. Clydie BraunKaren states that later in the fall she developed right lower quadrant pain. She thought she might have appendicitis and so she was evaluated by her PCP who referred her to urology. 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 She states her pain got better. About 3-1/2-4 months ago she then developed left lower quadrant pain that radiated to the back. In the past, she had had a hysterectomy for endometriosis and she thought this left lower quadrant pain may be due to endometriosis again. She saw her gynecologist and had a pelvic ultrasound and was diagnosed with diverticulitis. She then saw her PCP and on March 10 was given a course of Augmentin for 10 days. She is still taking this. She reports that 3 weeks ago she developed diarrhea. It is after she developed the diarrhea that she saw her gynecologist and had the pelvic ultrasound. She has been having diarrhea 5-10 times per day, watery, moderate in volume, with no blood. She has no nocturnal stooling. She has no mucus with her stools. She has not had any stool tests done. She has occasional nausea but no vomiting. She has had no night sweats or fever but has been having intermittent chills. She has no urinary symptoms. She has not ever had pain like this in the past. She has had episodes over the years where she eats, gets crampy, and has an urgent bowel movement, but nothing like the discomfort she is having now. She notes her current pain is not exacerbated with ingestion of food. She has had no change in her pain since started on  the Augmentin.   Past Medical History  Diagnosis Date  . Hyperlipidemia   . IBS (irritable bowel syndrome)   . Endometriosis   . GERD (gastroesophageal reflux disease)   . Migraine     Past Surgical History  Procedure Laterality Date  . Right elbow    . Vaginal hysterectomy    . Kidney stone surgery      retrograde study for filling defect   Family History  Problem Relation Age of Onset  . Colon cancer Neg Hx    Social History  Substance Use Topics  . Smoking status: Never Smoker   . Smokeless tobacco: Never Used  . Alcohol Use: No   Current Outpatient Prescriptions  Medication Sig Dispense Refill  . aspirin 81 MG tablet Take 81 mg by mouth daily.    . cholecalciferol (VITAMIN [name]) 1000 UNITS tablet Take 1,000 Units by mouth daily.    Marland Kitchen. escitalopram (LEXAPRO) 20 MG tablet Take 20 mg by mouth daily.    . hydrocortisone (ANUSOL-HC) 25 MG suppository Place 25 mg rectally 2 (two) times daily as needed for hemorrhoids or itching.    . loratadine (CLARITIN) 10 MG tablet Take 10 mg by mouth daily.    . pantoprazole (PROTONIX) 40 MG tablet Take 40 mg by mouth daily.    . rizatriptan (MAXALT) 10 MG tablet Take 10 mg by mouth as needed for migraine. May repeat in 2 hours  if needed    . rosuvastatin (CRESTOR) 10 MG tablet Take 10 mg by mouth daily.     No current facility-administered medications for this visit.   Allergies  Allergen Reactions  . Lipitor [Atorvastatin]     Myalgia     Review of Systems: Per history of present illness otherwise negative.  Studies:  Pelvic [facility]/15 [facility] showed prominent: Left adnexal area with hyperechoic walls, probable diverticulitis. No fluid in the cul-de-sac.  Physical Exam: BP 120/74 mmHg  Pulse 80  Ht  (1.575 m)  Wt 214 lb (97.07 kg)  BMI 39.13 kg/m2 General: Pleasant, well developed ,female in no acute distress Head: Normocephalic and atraumatic Eyes:  sclerae anicteric,  conjunctiva pink  Ears: Normal auditory acuity Lungs: Clear throughout to auscultation Heart: Regular rate and rhythm Abdomen: Soft, non distended, tender left lower quadrant and suprapubic area with guarding but no rebound, No masses, no hepatomegaly. Normal bowel sounds Musculoskeletal: Symmetrical with no gross deformities  Extremities: No edema  Neurological: Alert oriented x 4, grossly nonfocal Psychological:  Alert and cooperative. Normal mood and affect  Assessment and Recommendations: 55 year old female with a several month history of left lower quadrant and suprapubic pain that has become much more intense over the past 2 weeks, with 3 week history of diarrhea, referred for evaluation. [facility] 2024was suggestive of diverticulitis. Patient has been on Augmentin with no improvement whatsoever. A CBC, basic metabolic panel, stool culture, stool for ova and parasites, stool for C. difficile, and lactoferrin will be obtained. She will be scheduled for a CT of the abdomen and pelvis today to evaluate for diverticulitis, abscess, etc. She's been instructed to adhere to a low residue diet. Further recommendations will be made later today pending the findings of her blood work and CT scan.        Almalik Weissberg, Moise Boring 10/12/2015,

## 2015-10-12 NOTE — Telephone Encounter (Signed)
CT of the abdomen and pelvis done at [facility]. Report obtained via fax and reviewed by Dr Adela LankArmbruster. notes and labs reviewed also. Patient aware of the results. She will try to obtain the stool specimen by Monday for stool studies. She is aware if she has not had improvement on Augmentin she may stop it. Flagyl 500 mg TID escribed to [facility] to begin ideally after the stool specimen obtained.  Follow progress with the patient Monday 10/15/15

## 2015-10-13 NOTE — Progress Notes (Signed)
Reviewed and agree with management plan.  Malcolm T. Stark, MD FACG 

## 2015-10-15 ENCOUNTER — Telehealth: Payer: Self-pay | Admitting: *Deleted

## 2015-10-15 ENCOUNTER — Telehealth: Payer: Self-pay | Admitting: Physician Assistant

## 2015-10-15 NOTE — Telephone Encounter (Signed)
Please fax to [facility] end[facility]

## 2015-10-15 NOTE — Telephone Encounter (Signed)
Left a message for patient to call back. 

## 2015-10-15 NOTE — Telephone Encounter (Signed)
CT report reviewed. If symptoms have resolved she can stop antibiotics and observe. Complete stool specimens if diarrhea persists.

## 2015-10-15 NOTE — Telephone Encounter (Signed)
Spoke with patient. She saw Lawson FiscalLori Hvozdovic, PA-C on 10/12/15 for diarrhea and LLQ pain. She had been on Augmentin since 10/05/15. She had a CT on Friday. On Saturday, she noticed a red rash bigger than the size of her hand on her leg. She stopped taking the Augmentin and did not start the Flagyl that was prescribed by Dr. Adela LankArmbruster on Friday(after her CT scan). She reports her symptoms of diarrhea/pain are better. The rash on her leg is still there. Please, advise.

## 2015-10-15 NOTE — Telephone Encounter (Signed)
Patient given recommendations. 

## 2015-10-15 NOTE — Telephone Encounter (Signed)
Patient requested CT results be faxed to her at [phone]. Faxed results as requested.

## 2015-10-15 NOTE — Telephone Encounter (Signed)
The report is on its way.Thank you for reviewing.

## 2015-10-15 NOTE — Telephone Encounter (Signed)
Patient had the scan at [facility]. I have a copy and can fax it to you if you tell me where to fax it.(it has been sent down to be scanned but has not been entered yet)

## 2015-10-15 NOTE — Telephone Encounter (Signed)
I cannot locate the CT scan report. I need to review this to give further advice. Please locate it for review.  Localized rash on leg is unlikely to be a drug rash.

## 2015-10-19 ENCOUNTER — Encounter: Payer: Self-pay | Admitting: Physician Assistant

## 2016-05-27 HISTORY — PX: CHOLECYSTECTOMY: SHX55

## 2016-09-25 ENCOUNTER — Ambulatory Visit: Payer: Self-pay | Admitting: Orthopedic Surgery

## 2016-09-25 DIAGNOSIS — G5601 Carpal tunnel syndrome, right upper limb: Secondary | ICD-10-CM

## 2016-09-25 HISTORY — DX: Carpal tunnel syndrome, right upper limb: G56.01

## 2016-09-26 ENCOUNTER — Encounter (HOSPITAL_BASED_OUTPATIENT_CLINIC_OR_DEPARTMENT_OTHER): Payer: Self-pay | Admitting: *Deleted

## 2016-09-29 ENCOUNTER — Encounter (HOSPITAL_BASED_OUTPATIENT_CLINIC_OR_DEPARTMENT_OTHER): Payer: Self-pay | Admitting: *Deleted

## 2016-10-03 ENCOUNTER — Ambulatory Visit (HOSPITAL_BASED_OUTPATIENT_CLINIC_OR_DEPARTMENT_OTHER): Payer: 59 | Admitting: Certified Registered"

## 2016-10-03 ENCOUNTER — Ambulatory Visit (HOSPITAL_BASED_OUTPATIENT_CLINIC_OR_DEPARTMENT_OTHER)
Admission: RE | Admit: 2016-10-03 | Discharge: 2016-10-03 | Disposition: A | Discharge status: Home / Self Care | Payer: 59 | Source: Ambulatory Visit | Attending: Orthopedic Surgery | Admitting: Orthopedic Surgery

## 2016-10-03 ENCOUNTER — Encounter (HOSPITAL_BASED_OUTPATIENT_CLINIC_OR_DEPARTMENT_OTHER)
Admission: RE | Disposition: A | Discharge status: Home / Self Care | Payer: Self-pay | Source: Ambulatory Visit | Attending: Orthopedic Surgery

## 2016-10-03 ENCOUNTER — Encounter (HOSPITAL_BASED_OUTPATIENT_CLINIC_OR_DEPARTMENT_OTHER): Payer: Self-pay | Admitting: Certified Registered"

## 2016-10-03 DIAGNOSIS — Z79899 Other long term (current) drug therapy: Secondary | ICD-10-CM | POA: Diagnosis not present

## 2016-10-03 DIAGNOSIS — Z01812 Encounter for preprocedural laboratory examination: Secondary | ICD-10-CM | POA: Diagnosis present

## 2016-10-03 DIAGNOSIS — K219 Gastro-esophageal reflux disease without esophagitis: Secondary | ICD-10-CM | POA: Diagnosis not present

## 2016-10-03 DIAGNOSIS — Z90710 Acquired absence of both cervix and uterus: Secondary | ICD-10-CM | POA: Diagnosis not present

## 2016-10-03 DIAGNOSIS — M170 Bilateral primary osteoarthritis of knee: Secondary | ICD-10-CM | POA: Insufficient documentation

## 2016-10-03 DIAGNOSIS — G5603 Carpal tunnel syndrome, bilateral upper limbs: Secondary | ICD-10-CM | POA: Insufficient documentation

## 2016-10-03 DIAGNOSIS — E785 Hyperlipidemia, unspecified: Secondary | ICD-10-CM | POA: Diagnosis not present

## 2016-10-03 DIAGNOSIS — Z7982 Long term (current) use of aspirin: Secondary | ICD-10-CM | POA: Insufficient documentation

## 2016-10-03 DIAGNOSIS — K589 Irritable bowel syndrome without diarrhea: Secondary | ICD-10-CM | POA: Insufficient documentation

## 2016-10-03 HISTORY — DX: Carpal tunnel syndrome, right upper limb: G56.01

## 2016-10-03 HISTORY — DX: Family history of other specified conditions: Z84.89

## 2016-10-03 HISTORY — DX: Microstomia: Q18.5

## 2016-10-03 HISTORY — DX: Unspecified osteoarthritis, unspecified site: M19.90

## 2016-10-03 HISTORY — PX: CARPAL TUNNEL RELEASE: SHX101

## 2016-10-03 HISTORY — DX: Nausea with vomiting, unspecified: R11.2

## 2016-10-03 HISTORY — DX: Other specified postprocedural states: Z98.890

## 2016-10-03 HISTORY — DX: Personal history of other diseases of the nervous system and sense organs: Z86.69

## 2016-10-03 HISTORY — DX: Dental restoration status: Z98.811

## 2016-10-03 HISTORY — PX: STERIOD INJECTION: SHX5046

## 2016-10-03 SURGERY — CARPAL TUNNEL RELEASE
Anesthesia: Monitor Anesthesia Care | Site: Wrist | Laterality: Right

## 2016-10-03 MED ORDER — CEFAZOLIN SODIUM-DEXTROSE 2-4 GM/100ML-% IV SOLN
INTRAVENOUS | Status: AC
  Filled 2016-10-03: qty 100

## 2016-10-03 MED ORDER — FENTANYL CITRATE (PF) 100 MCG/2ML IJ SOLN
INTRAMUSCULAR | Status: AC
  Filled 2016-10-03: qty 2

## 2016-10-03 MED ORDER — FENTANYL CITRATE (PF) 100 MCG/2ML IJ SOLN: 50.0000 ug | INTRAMUSCULAR | Status: DC | PRN

## 2016-10-03 MED ORDER — LACTATED RINGERS IV SOLN: INTRAVENOUS | Status: DC

## 2016-10-03 MED ORDER — LIDOCAINE HCL (PF) 1 % IJ SOLN
INTRAMUSCULAR | Status: AC
  Filled 2016-10-03: qty 30

## 2016-10-03 MED ORDER — CEFAZOLIN SODIUM-DEXTROSE 2-4 GM/100ML-% IV SOLN
2.0000 g | INTRAVENOUS | Status: AC
  Administered 2016-10-03: 2 g via INTRAVENOUS

## 2016-10-03 MED ORDER — SODIUM BICARBONATE 4 % IV SOLN
INTRAVENOUS | Status: DC | PRN
  Administered 2016-10-03: 20 mL via INTRAMUSCULAR

## 2016-10-03 MED ORDER — FENTANYL CITRATE (PF) 100 MCG/2ML IJ SOLN: 25.0000 ug | INTRAMUSCULAR | Status: DC | PRN

## 2016-10-03 MED ORDER — SODIUM BICARBONATE 4 % IV SOLN
INTRAVENOUS | Status: AC
  Filled 2016-10-03: qty 5

## 2016-10-03 MED ORDER — LIDOCAINE HCL (PF) 1 % IJ SOLN
INTRAMUSCULAR | Status: AC
  Filled 2016-10-03: qty 5

## 2016-10-03 MED ORDER — MIDAZOLAM HCL 2 MG/2ML IJ SOLN
INTRAMUSCULAR | Status: AC
  Filled 2016-10-03: qty 2

## 2016-10-03 MED ORDER — CHLORHEXIDINE GLUCONATE 4 % EX LIQD: 60.0000 mL | Freq: Once | CUTANEOUS | Status: DC

## 2016-10-03 MED ORDER — SCOPOLAMINE 1 MG/3DAYS TD PT72: 1.0000 | MEDICATED_PATCH | Freq: Once | TRANSDERMAL | Status: DC | PRN

## 2016-10-03 MED ORDER — FENTANYL CITRATE (PF) 100 MCG/2ML IJ SOLN
INTRAMUSCULAR | Status: DC | PRN
  Administered 2016-10-03: 50 ug via INTRAVENOUS

## 2016-10-03 MED ORDER — BETAMETHASONE SOD PHOS & ACET 6 (3-3) MG/ML IJ SUSP
INTRAMUSCULAR | Status: DC | PRN
  Administered 2016-10-03: 6 mg via INTRA_ARTICULAR

## 2016-10-03 MED ORDER — METOCLOPRAMIDE HCL 5 MG/ML IJ SOLN: 10.0000 mg | Freq: Once | INTRAMUSCULAR | Status: DC | PRN

## 2016-10-03 MED ORDER — BETAMETHASONE SOD PHOS & ACET 6 (3-3) MG/ML IJ SUSP
INTRAMUSCULAR | Status: AC
  Filled 2016-10-03: qty 1

## 2016-10-03 MED ORDER — MIDAZOLAM HCL 2 MG/2ML IJ SOLN
1.0000 mg | INTRAMUSCULAR | Status: DC | PRN
  Administered 2016-10-03: 1 mg via INTRAVENOUS

## 2016-10-03 MED ORDER — LACTATED RINGERS IV SOLN
INTRAVENOUS | Status: DC
  Administered 2016-10-03: 09:00:00 via INTRAVENOUS

## 2016-10-03 MED ORDER — HYDROCODONE-ACETAMINOPHEN 5-325 MG PO TABS: 2.0000 | ORAL_TABLET | ORAL | 0 refills | Status: DC | PRN

## 2016-10-03 MED ORDER — MEPERIDINE HCL 25 MG/ML IJ SOLN: 6.2500 mg | INTRAMUSCULAR | Status: DC | PRN

## 2016-10-03 MED ORDER — BUPIVACAINE HCL (PF) 0.25 % IJ SOLN
INTRAMUSCULAR | Status: AC
  Filled 2016-10-03: qty 30

## 2016-10-03 SURGICAL SUPPLY — 48 items
BANDAGE ACE 3X5.8 VEL STRL LF (GAUZE/BANDAGES/DRESSINGS) ×4 IMPLANT
BLADE CARPAL TUNNEL SNGL USE (BLADE) ×4 IMPLANT
BLADE SURG 15 STRL LF DISP TIS (BLADE) ×4 IMPLANT
BLADE SURG 15 STRL SS (BLADE) ×4
BNDG CONFORM 3 STRL LF (GAUZE/BANDAGES/DRESSINGS) ×4 IMPLANT
BRUSH SCRUB EZ PLAIN DRY (MISCELLANEOUS) ×4 IMPLANT
CORDS BIPOLAR (ELECTRODE) ×4 IMPLANT
COVER BACK TABLE 60X90IN (DRAPES) ×4 IMPLANT
CUFF TOURNIQUET SINGLE 18IN (TOURNIQUET CUFF) ×4 IMPLANT
DRAIN PENROSE 1/4X12 LTX STRL (WOUND CARE) IMPLANT
DRAPE EXTREMITY T 121X128X90 (DRAPE) ×4 IMPLANT
DRAPE SURG 17X23 STRL (DRAPES) ×4 IMPLANT
DRSG EMULSION OIL 3X3 NADH (GAUZE/BANDAGES/DRESSINGS) ×4 IMPLANT
GAUZE SPONGE 4X4 12PLY STRL (GAUZE/BANDAGES/DRESSINGS) IMPLANT
GAUZE SPONGE 4X4 16PLY XRAY LF (GAUZE/BANDAGES/DRESSINGS) IMPLANT
GAUZE XEROFORM 1X8 LF (GAUZE/BANDAGES/DRESSINGS) ×4 IMPLANT
GLOVE BIO SURGEON STRL SZ 6.5 (GLOVE) ×3 IMPLANT
GLOVE BIO SURGEONS STRL SZ 6.5 (GLOVE) ×1
GLOVE BIOGEL M STRL SZ7.5 (GLOVE) IMPLANT
GLOVE BIOGEL PI IND STRL 7.0 (GLOVE) ×4 IMPLANT
GLOVE BIOGEL PI INDICATOR 7.0 (GLOVE) ×4
GLOVE SS BIOGEL STRL SZ 8 (GLOVE) ×2 IMPLANT
GLOVE SUPERSENSE BIOGEL SZ 8 (GLOVE) ×2
GOWN STRL REUS W/ TWL LRG LVL3 (GOWN DISPOSABLE) ×2 IMPLANT
GOWN STRL REUS W/ TWL XL LVL3 (GOWN DISPOSABLE) ×2 IMPLANT
GOWN STRL REUS W/TWL LRG LVL3 (GOWN DISPOSABLE) ×2
GOWN STRL REUS W/TWL XL LVL3 (GOWN DISPOSABLE) ×2
LOOP VESSEL MAXI BLUE (MISCELLANEOUS) IMPLANT
NDL SAFETY ECLIPSE 18X1.5 (NEEDLE) ×2 IMPLANT
NEEDLE HYPO 18GX1.5 SHARP (NEEDLE) ×2
NEEDLE HYPO 22GX1.5 SAFETY (NEEDLE) IMPLANT
NEEDLE HYPO 25X1 1.5 SAFETY (NEEDLE) ×8 IMPLANT
NS IRRIG 1000ML POUR BTL (IV SOLUTION) ×4 IMPLANT
PACK BASIN DAY SURGERY FS (CUSTOM PROCEDURE TRAY) ×4 IMPLANT
PAD ALCOHOL SWAB (MISCELLANEOUS) ×32 IMPLANT
PAD CAST 3X4 CTTN HI CHSV (CAST SUPPLIES) ×4 IMPLANT
PADDING CAST ABS 4INX4YD NS (CAST SUPPLIES) ×2
PADDING CAST ABS COTTON 4X4 ST (CAST SUPPLIES) ×2 IMPLANT
PADDING CAST COTTON 3X4 STRL (CAST SUPPLIES) ×4
SHEET MEDIUM DRAPE 40X70 STRL (DRAPES) ×4 IMPLANT
STOCKINETTE 4X48 STRL (DRAPES) ×4 IMPLANT
SUT PROLENE 4 0 PS 2 18 (SUTURE) ×4 IMPLANT
SYR BULB 3OZ (MISCELLANEOUS) ×4 IMPLANT
SYR CONTROL 10ML LL (SYRINGE) ×8 IMPLANT
TOWEL OR 17X24 6PK STRL BLUE (TOWEL DISPOSABLE) ×4 IMPLANT
TOWEL OR NON WOVEN STRL DISP B (DISPOSABLE) ×4 IMPLANT
TRAY DSU PREP LF (CUSTOM PROCEDURE TRAY) ×4 IMPLANT
UNDERPAD 30X30 (UNDERPADS AND DIAPERS) ×4 IMPLANT

## 2016-10-03 NOTE — H&P (Signed)
Natalie [name] is an 56 y.o. female.   Chief Complaint: presents for CTR right and left CT injection HPI: Patient presents for evaluation and treatment of the of their upper extremity predicament. The patient denies neck, back, chest or  abdominal pain. The patient notes that they have no lower extremity problems. The patients primary complaint is noted. We are planning surgical care pathway for the upper extremity.  Past Medical History:  Diagnosis Date  . Abnormally small mouth   . Arthritis    knees  . Carpal tunnel syndrome on right 09/2016  . Dental crowns present    also dental caps  . Family history of adverse reaction to anesthesia    pt's mother and son have hx. of post-op N/V  . GERD (gastroesophageal reflux disease)   . Hx of migraines   . Hyperlipidemia   . IBS (irritable bowel syndrome)   . PONV (postoperative nausea and vomiting)    1 episode only    Past Surgical History:  Procedure Laterality Date  . CESAREAN SECTION    . CHOLECYSTECTOMY  05/27/2016  . COLONOSCOPY WITH PROPOFOL  04/20/2015  . CYSTO    . INCISION / DEBRIDEMENT BONE ELBOW Right 06/14/2003   radial head  . KNEE ARTHROSCOPY Left 01/22/2010  . SALPINGOOPHORECTOMY Left 03/31/2002  . TOTAL ABDOMINAL HYSTERECTOMY  03/31/2002    Family History  Problem Relation Age of Onset  . Anesthesia problems Mother     post-op N/V  . Anesthesia problems Other     post-op N/V   Social History:  reports that she has never smoked. She has never used smokeless tobacco. She reports that she does not drink alcohol or use drugs.  Allergies:  Allergies  Allergen Reactions  . Lipitor [Atorvastatin] Other (See Comments)    MYALGIA    Medications Prior to Admission  Medication Sig Dispense Refill  . aspirin 81 MG tablet Take 81 mg by mouth daily.    Marland Kitchen escitalopram (LEXAPRO) 20 MG tablet Take 20 mg by mouth daily.    Marland Kitchen loratadine (CLARITIN) 10 MG tablet Take 10 mg by mouth daily.    . meloxicam (MOBIC) 15 MG  tablet Take 15 mg by mouth daily.    . pantoprazole (PROTONIX) 40 MG tablet Take 40 mg by mouth daily.    . rosuvastatin (CRESTOR) 10 MG tablet Take 10 mg by mouth daily.    . rizatriptan (MAXALT) 10 MG tablet Take 10 mg by mouth as needed for migraine. May repeat in 2 hours if needed      No results found for this or any previous visit (from the past 48 hour(s)). No results found.  Review of Systems  Respiratory: Negative.   Gastrointestinal: Negative.   Genitourinary: Negative.     Height 5\' 2"  (1.575 m), weight 97.5 kg (215 lb). Physical Exam CTS RUE with provacative findings on testing  The patient is alert and oriented in no acute distress. The patient complains of pain in the affected upper extremity.  The patient is noted to have a normal HEENT exam. Lung fields show equal chest expansion and no shortness of breath. Abdomen exam is nontender without distention. Lower extremity examination does not show any fracture dislocation or blood clot symptoms. Pelvis is stable and the neck and back are stable and nontender.  Assessment/Plan Bilateral R > L CTS plan release  We are planning surgery for your upper extremity. The risk and benefits of surgery to include risk of bleeding, infection, anesthesia,  damage to normal structures and failure of the surgery to accomplish its intended goals of relieving symptoms and restoring function have been discussed in detail. With this in mind we plan to proceed. I have specifically discussed with the patient the pre-and postoperative regime and the dos and don'ts and risk and benefits in great detail. Risk and benefits of surgery also include risk of dystrophy(CRPS), chronic nerve pain, failure of the healing process to go onto completion and other inherent risks of surgery The relavent the pathophysiology of the disease/injury process, as well as the alternatives for treatment and postoperative course of action has been discussed in great detail  with the patient who desires to proceed.  We will do everything in our power to help you (the patient) restore function to the upper extremity. It is a pleasure to see this patient today.   [name],Sufyaan Palma M, MD 10/03/2016, 9:33 AM

## 2016-10-03 NOTE — Discharge Instructions (Signed)
Keep bandage clean and dry.  Call for any problems.  No smoking.  Criteria for driving a car: you should be off your pain medicine for 7-8 hours, able to drive one handed(confident), thinking clearly and feeling able in your judgement to drive. °Continue elevation as it will decrease swelling.  If instructed by MD move your fingers within the confines of the bandage/splint.  Use ice if instructed by your MD. Call immediately for any sudden loss of feeling in your hand/arm or change in functional abilities of the extremity.We recommend that you to take vitamin C 1000 mg a day to promote healing. °We also recommend that if you require  pain medicine that you take a stool softener to prevent constipation as most pain medicines will have constipation side effects. We recommend either Peri-Colace or Senokot and recommend that you also consider adding MiraLAX as well to prevent the constipation affects from pain medicine if you are required to use them. These medicines are over the counter and may be purchased at a local pharmacy. A cup of yogurt and a probiotic can also be helpful during the recovery process as the medicines can disrupt your intestinal environment. ° ° ° °Post Anesthesia Home Care Instructions ° °Activity: °Get plenty of rest for the remainder of the day. A responsible adult should stay with you for 24 hours following the procedure.  °For the next 24 hours, DO NOT: °-Drive a car °-Operate machinery °-Drink alcoholic beverages °-Take any medication unless instructed by your physician °-Make any legal decisions or sign important papers. ° °Meals: °Start with liquid foods such as gelatin or soup. Progress to regular foods as tolerated. Avoid greasy, spicy, heavy foods. If nausea and/or vomiting occur, drink only clear liquids until the nausea and/or vomiting subsides. Call your physician if vomiting continues. ° °Special Instructions/Symptoms: °Your throat may feel dry or sore from the anesthesia or the  breathing tube placed in your throat during surgery. If this causes discomfort, gargle with warm salt water. The discomfort should disappear within 24 hours. ° °If you had a scopolamine patch placed behind your ear for the management of post- operative nausea and/or vomiting: ° °1. The medication in the patch is effective for 72 hours, after which it should be removed.  Wrap patch in a tissue and discard in the trash. Wash hands thoroughly with soap and water. °2. You may remove the patch earlier than 72 hours if you experience unpleasant side effects which may include dry mouth, dizziness or visual disturbances. °3. Avoid touching the patch. Wash your hands with soap and water after contact with the patch. °  ° °

## 2016-10-03 NOTE — Op Note (Signed)
See dictation #[id number] Natalie PeaGramig MD

## 2016-10-03 NOTE — Transfer of Care (Signed)
Immediate Anesthesia Transfer of Care Note  Patient: Natalie [name]  Procedure(s) Performed: Procedure(s) (LRB): Right limited open CARPAL TUNNEL RELEASE (Right) STEROID INJECTION (Left)  Patient Location: PACU  Anesthesia Type: MAC  Level of Consciousness: awake, alert , oriented and patient cooperative  Airway & Oxygen Therapy: Patient Spontanous Breathing and Patient connected to face mask oxygen  Post-op Assessment: Report given to PACU RN and Post -op Vital signs reviewed and stable  Post vital signs: Reviewed and stable  Complications: No apparent anesthesia complications  Last Vitals:  Vitals:   10/03/16 0936 10/03/16 1017  BP: 127/74   Pulse: 78   Resp: 18   Temp: 36.9 C (P) 36.2 C

## 2016-10-03 NOTE — Anesthesia Preprocedure Evaluation (Signed)
Anesthesia Evaluation  Patient identified by MRN, date of birth, ID band Patient awake    Reviewed: Allergy & Precautions, NPO status , Patient's Chart, lab work & pertinent test results  History of Anesthesia Complications (+) PONV  Airway Mallampati: II  TM Distance: >3 FB Neck ROM: Full    Dental no notable dental hx. (+) Caps   Pulmonary neg pulmonary ROS,    Pulmonary exam normal breath sounds clear to auscultation       Cardiovascular negative cardio ROS Normal cardiovascular exam Rhythm:Regular Rate:Normal     Neuro/Psych negative neurological ROS  negative psych ROS   GI/Hepatic negative GI ROS, Neg liver ROS,   Endo/Other  negative endocrine ROS  Renal/GU negative Renal ROS  negative genitourinary   Musculoskeletal negative musculoskeletal ROS (+)   Abdominal   Peds negative pediatric ROS (+)  Hematology negative hematology ROS (+)   Anesthesia Other Findings   Reproductive/Obstetrics negative OB ROS                             Anesthesia Physical Anesthesia Plan  ASA: II  Anesthesia Plan: MAC   Post-op Pain Management:    Induction: Intravenous  Airway Management Planned: Simple Face Mask  Additional Equipment:   Intra-op Plan:   Post-operative Plan:   Informed Consent: I have reviewed the patients History and Physical, chart, labs and discussed the procedure including the risks, benefits and alternatives for the proposed anesthesia with the patient or authorized representative who has indicated his/her understanding and acceptance.   Dental advisory given  Plan Discussed with: CRNA  Anesthesia Plan Comments:         Anesthesia Quick Evaluation

## 2016-10-03 NOTE — Anesthesia Postprocedure Evaluation (Signed)
Anesthesia Post Note  Patient: Natalie [name]  Procedure(s) Performed: Procedure(s) (LRB): Right limited open CARPAL TUNNEL RELEASE (Right) STEROID INJECTION (Left)  Patient location during evaluation: PACU Anesthesia Type: MAC Level of consciousness: awake and alert Pain management: pain level controlled Vital Signs Assessment: post-procedure vital signs reviewed and stable Respiratory status: spontaneous breathing, nonlabored ventilation, respiratory function stable and patient connected to nasal cannula oxygen Cardiovascular status: stable and blood pressure returned to baseline Anesthetic complications: no       Last Vitals:  Vitals:   10/03/16 1030 10/03/16 1040  BP: (!) 145/82 (!) 156/68  Pulse: 73 77  Resp: 15 18  Temp:  37.2 C    Last Pain:  Vitals:   10/03/16 1100  TempSrc:   PainSc: 1                  Lynda Rainwater

## 2016-10-06 ENCOUNTER — Encounter (HOSPITAL_BASED_OUTPATIENT_CLINIC_OR_DEPARTMENT_OTHER): Payer: Self-pay | Admitting: Orthopedic Surgery

## 2016-10-06 NOTE — Op Note (Signed)
[name], [name]                ACCOUNT NO.:  [id number]  MEDICAL RECORD NO.:  [id number]  LOCATION:                                 FACILITY:  PHYSICIAN:  Natalie Ano. Amanda Pea, M.D.     DATE OF BIRTH:  DATE OF PROCEDURE:10/03/2016 DATE OF DISCHARGE:10/03/16                              OPERATIVE REPORT   PREOPERATIVE DIAGNOSIS:  Bilateral right greater than left carpal tunnel syndrome.  POSTOPERATIVE DIAGNOSIS:  Bilateral right greater than left carpal tunnel syndrome.  PROCEDURE: 1. Right median nerve/peripheral nerve block at the wrist and forearm     level for anesthetic purposes for carpal tunnel release. 2. Right limited open carpal tunnel release. 3. Left carpal tunnel injection with 1 mL of Celestone and 1 mL of     lidocaine without epinephrine.  SURGEON:  Natalie Ano. Amanda Pea, MD.  ASSISTANT:  None.  COMPLICATIONS:  None.  ANESTHESIA:  Block anesthetic with IV sedation.  TOURNIQUET TIME:  Less than 15 minutes.  INDICATIONS:  Pleasant female who presents with the above-mentioned diagnosis.  I have counseled her in regard to risks and benefits of surgery, and she desires to proceed with the above-mentioned operative intervention.  All questions have been encouraged and answered and addressed.  OPERATIVE PROCEDURE:  The patient was seen by myself and Anesthesia, taken to the operative theater and underwent a smooth induction of peripheral nerve/median nerve block about the right upper extremity. This was primarily a field block with lidocaine, Sensorcaine without epinephrine.  She was prepped and draped in usual sterile fashion and following this, underwent a very careful and cautious approach to the extremity with elevation of the tourniquet after time-out was called and preoperative antibiotics were given.  Under tourniquet control, a 1 cm incision was made.  Dissection was carried down.  Palmar fascia incised. Distal edge of transverse carpal ligament released  under 4.0 loupe magnification.  Following this, distal proximal dissection was carried out until adequate room was available for canal, prepared toward device 1, 2, and 3.  The patient was awake, alert, and oriented during all points and passes and following this, had the security clip placed. Following this, obturator disengaged, indicating correct placement and security knife was placed and security clip effectively releasing the proximal leaflet of transverse carpal ligament, which the patient tolerated this well.  The patient was awake, alert, and oriented.  Had excellent motion to the fingers and no complications.  We irrigated copiously, closed the wound with Prolene after hemostasis was secured.  The patient tolerated the procedure nicely.  We will see her back in the office in 1 week.  I should note that she was prepped and draped about the left wrist and underwent a carpal tunnel injection with 1 mL of Celestone and 1 mL of lidocaine without complicating feature and following this, discussed elevation, range of motion, and other measures.  We are going to see how she does with the above-mentioned algorithm on the left side and hopefully, the right side will become quiescent swiftly in regard to the decompressive effects of the carpal tunnel release today.  These notes have been discussed.  Norco p.r.n. pain.  See her in  a week, standard postop algorithm will be adhered to.     Natalie AnoWilliam M. Amanda [name], M.D.   ______________________________ Natalie AnoWilliam M. Amanda [name], M.D.    [facility] CenterWM[facility]/MEDQ  D:  10/03/2016  T:  10/03/2016  Job:  [id number]

## 2017-02-05 NOTE — Anesthesia Postprocedure Evaluation (Signed)
Anesthesia Post Note  Patient: Natalie [name]  Procedure(s) Performed: Procedure(s) (LRB): Right limited open CARPAL TUNNEL RELEASE (Right) STEROID INJECTION (Left)     Anesthesia Post Evaluation  Last Vitals:  Vitals:   10/03/16 1030 10/03/16 1040  BP: (!) 145/82 (!) 156/68  Pulse: 73 77  Resp: 15 18  Temp:  37.2 C    Last Pain:  Vitals:   10/06/16 0812  TempSrc:   PainSc: 3                  Phillips Groutarignan, Aidynn Krenn

## 2017-02-05 NOTE — Addendum Note (Signed)
Addendum  created 02/05/17 1413 by Fount Bahe, MD   Sign clinical note    

## 2017-09-22 DIAGNOSIS — E782 Mixed hyperlipidemia: Secondary | ICD-10-CM | POA: Insufficient documentation

## 2017-09-22 DIAGNOSIS — G43909 Migraine, unspecified, not intractable, without status migrainosus: Secondary | ICD-10-CM | POA: Insufficient documentation

## 2019-03-02 ENCOUNTER — Other Ambulatory Visit: Payer: Self-pay | Admitting: Obstetrics and Gynecology

## 2019-03-02 DIAGNOSIS — Z1231 Encounter for screening mammogram for malignant neoplasm of breast: Secondary | ICD-10-CM

## 2019-04-22 ENCOUNTER — Ambulatory Visit: Payer: 59

## 2020-01-13 ENCOUNTER — Encounter: Payer: Self-pay | Admitting: Family Medicine

## 2020-01-13 ENCOUNTER — Ambulatory Visit (INDEPENDENT_AMBULATORY_CARE_PROVIDER_SITE_OTHER): Payer: 59 | Admitting: Family Medicine

## 2020-01-13 VITALS — BP 140/74 | HR 73 | Temp 98.0°F | Ht 62.0 in | Wt 222.0 lb

## 2020-01-13 DIAGNOSIS — Z0000 Encounter for general adult medical examination without abnormal findings: Secondary | ICD-10-CM | POA: Diagnosis not present

## 2020-01-13 DIAGNOSIS — E7800 Pure hypercholesterolemia, unspecified: Secondary | ICD-10-CM | POA: Diagnosis not present

## 2020-01-13 DIAGNOSIS — L659 Nonscarring hair loss, unspecified: Secondary | ICD-10-CM

## 2020-01-13 LAB — COMPREHENSIVE METABOLIC PANEL
ALT: 5 U/L (ref 0–35)
AST: 23 U/L (ref 0–37)
Albumin: 4.3 g/dL (ref 3.5–5.2)
Alkaline Phosphatase: 63 U/L (ref 39–117)
BUN: 16 mg/dL (ref 6–23)
CO2: 29 mEq/L (ref 19–32)
Calcium: 9.8 mg/dL (ref 8.4–10.5)
Chloride: 104 mEq/L (ref 96–112)
Creatinine, Ser: 0.83 mg/dL (ref 0.40–1.20)
GFR: 69.9 mL/min (ref >60.00)
Glucose, Bld: 102 mg/dL — ABNORMAL HIGH (ref 70–99)
Potassium: 4.1 mEq/L (ref 3.5–5.1)
Sodium: 138 mEq/L (ref 135–145)
Total Bilirubin: 0.8 mg/dL (ref 0.2–1.2)
Total Protein: 7 g/dL (ref 6.0–8.3)

## 2020-01-13 LAB — URINALYSIS, ROUTINE W REFLEX MICROSCOPIC
Bilirubin Urine: NEGATIVE
Hgb urine dipstick: NEGATIVE
Ketones, ur: NEGATIVE
Leukocytes,Ua: NEGATIVE
Nitrite: NEGATIVE
RBC / HPF: NONE SEEN (ref 0–?)
Specific Gravity, Urine: 1.025 (ref 1.000–1.030)
Total Protein, Urine: NEGATIVE
Urine Glucose: NEGATIVE
Urobilinogen, UA: 0.2 (ref 0.0–1.0)
WBC, UA: NONE SEEN (ref 0–?)
pH: 5.5 (ref 5.0–8.0)

## 2020-01-13 LAB — CBC
HCT: 40.4 % (ref 36.0–46.0)
Hemoglobin: 13.8 g/dL (ref 12.0–15.0)
MCHC: 34.2 g/dL (ref 30.0–36.0)
MCV: 92.4 fl (ref 78.0–100.0)
Platelets: 220 10*3/uL (ref 150.0–400.0)
RBC: 4.38 Mil/uL (ref 3.87–5.11)
RDW: 12.4 % (ref 11.5–15.5)
WBC: 6.7 10*3/uL (ref 4.0–10.5)

## 2020-01-13 LAB — LIPID PANEL
Cholesterol: 158 mg/dL (ref 0–200)
HDL: 67.5 mg/dL (ref >39.00)
LDL Cholesterol: 76 mg/dL (ref 0–99)
NonHDL: 90.98
Total CHOL/HDL Ratio: 2
Triglycerides: 75 mg/dL (ref 0.0–149.0)
VLDL: 15 mg/dL (ref 0.0–40.0)

## 2020-01-13 LAB — LDL CHOLESTEROL, DIRECT: Direct LDL: 83 mg/dL

## 2020-01-13 LAB — TSH: TSH: 1.1 u[IU]/mL (ref 0.35–4.50)

## 2020-01-13 NOTE — Progress Notes (Signed)
New Patient Office Visit  Subjective:  Patient ID: Natalie [name], female    DOB: May 09, 1961  Age: 59 y.o. MRN: [id number]  CC:  Chief Complaint  Patient presents with   Establish Care    New patient, refill on medications. Patient would like to know of any tips on losing hair.     HPI Natalie [name] presents for establishment of care and follow-up for elevated cholesterol, depression and more recent hair loss.  She has tolerated the Crestor well.  Has no known history of vascular disease.  She does not smoke.  History of depression that is responded well to Lexapro.  She has been on this medication for years.  She is followed by her GYN physician Dr. Maisie Fus for this issue.  More recently she has experienced hair loss.  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  She has been unable to find help.  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  Ambivalent about the Covid vaccine at this point.  Regular eye and dental care.  Past Medical History:  Diagnosis Date   Abnormally small mouth    Arthritis    knees   Carpal tunnel syndrome on right 09/2016   Dental crowns present    also dental caps   Family history of adverse reaction to anesthesia    pt's mother and son have hx. of post-op N/V   GERD (gastroesophageal reflux disease)    Hx of migraines    Hyperlipidemia    IBS (irritable bowel syndrome)    PONV (postoperative nausea and vomiting)    1 episode only    Past Surgical History:  Procedure Laterality Date   CARPAL TUNNEL RELEASE Right 10/03/2016   Procedure: Right limited open CARPAL TUNNEL RELEASE;  Surgeon: Dominica Severin, MD;  Location: [facility];  Service: Orthopedics;  Laterality: Right;   CESAREAN SECTION     CHOLECYSTECTOMY  05/27/2016   COLONOSCOPY WITH PROPOFOL  04/20/2015   CYSTO     INCISION / DEBRIDEMENT BONE ELBOW Right 06/14/2003   radial head   KNEE ARTHROSCOPY  Left 01/22/2010   SALPINGOOPHORECTOMY Left 03/31/2002   STERIOD INJECTION Left 10/03/2016   Procedure: STEROID INJECTION;  Surgeon: Dominica Severin, MD;  Location:  SURGERY CENTER;  Service: Orthopedics;  Laterality: Left;   TOTAL ABDOMINAL HYSTERECTOMY  03/31/2002    Family History  Problem Relation Age of Onset   Anesthesia problems Mother        post-op N/V   Anesthesia problems Other        post-op N/V    Social History   Socioeconomic History   Marital status: Married    Spouse name: Not on file   Number of children: Not on file   Years of education: Not on file   Highest education level: Not on file  Occupational History   Not on file  Tobacco Use   Smoking status: Never Smoker   Smokeless tobacco: Never Used  Vaping Use   Vaping Use: Never used  Substance and Sexual Activity   Alcohol use: No    Alcohol/week: 0.0 standard drinks   Drug use: No   Sexual activity: Not on file  Other Topics Concern   Not on file  Social History Narrative   Not on file   Social Determinants of Health   Financial Resource Strain:    Difficulty of Paying Living Expenses:  Food Insecurity:    Worried About Programme researcher, broadcasting/film/video in the Last Year:    Barista in the Last Year:   Transportation Needs:    Freight forwarder (Medical):    Lack of Transportation (Non-Medical):   Physical Activity:    Days of Exercise per Week:    Minutes of Exercise per Session:   Stress:    Feeling of Stress :   Social Connections:    Frequency of Communication with Friends and Family:    Frequency of Social Gatherings with Friends and Family:    Attends Religious Services:    Active Member of Clubs or Organizations:    Attends Engineer, structural:    Marital Status:   Intimate Partner Violence:    Fear of Current or Ex-Partner:    Emotionally Abused:    Physically Abused:    Sexually Abused:     ROS Review of Systems    Constitutional: Negative.   HENT: Negative.   Eyes: Negative for photophobia and visual disturbance.  Respiratory: Negative.   Cardiovascular: Negative.   Gastrointestinal: Negative.   Endocrine: Negative for polyphagia and polyuria.  Genitourinary: Negative.   Musculoskeletal: Negative.   Skin: Negative for pallor and rash.  Allergic/Immunologic: Negative for immunocompromised state.  Neurological: Negative for light-headedness and headaches.  Hematological: Does not bruise/bleed easily.   Depression screen [facility] 2/9 01/13/2020  Decreased Interest 0  Down, Depressed, Hopeless 0  PHQ - 2 Score 0    Objective:   Today's Vitals: BP 140/74    Pulse 73    Temp 98 F (36.7 C) (Tympanic)    Ht 5\' 2"  (1.575 m)    Wt 222 lb (100.7 kg)    SpO2 96%    BMI 40.60 kg/m   Physical Exam Vitals and nursing note reviewed.  Constitutional:      General: She is not in acute distress.    Appearance: Normal appearance. She is not ill-appearing, toxic-appearing or diaphoretic.  HENT:     Head: Normocephalic and atraumatic.     Right Ear: Tympanic membrane, ear canal and external ear normal.     Left Ear: Tympanic membrane, ear canal and external ear normal.     Mouth/Throat:     Mouth: Mucous membranes are moist.     Pharynx: Oropharynx is clear. No oropharyngeal exudate or posterior oropharyngeal erythema.  Eyes:     General: No scleral icterus.       Right eye: No discharge.        Left eye: No discharge.     Extraocular Movements: Extraocular movements intact.     Conjunctiva/sclera: Conjunctivae normal.     Pupils: Pupils are equal, round, and reactive to light.  Cardiovascular:     Rate and Rhythm: Normal rate and regular rhythm.  Pulmonary:     Effort: Pulmonary effort is normal.     Breath sounds: Normal breath sounds.  Abdominal:     General: Bowel sounds are normal.  Musculoskeletal:     Cervical back: No rigidity or tenderness.     Right lower leg: No edema.     Left lower  leg: No edema.  Lymphadenopathy:     Cervical: No cervical adenopathy.  Skin:    General: Skin is warm and dry.  Neurological:     Mental Status: She is alert and oriented to person, place, and time.  Psychiatric:        Mood and Affect: Mood normal.  Behavior: Behavior normal.     Assessment & Plan:   Problem List Items Addressed This Visit      Other   Elevated cholesterol   Relevant Orders   Lipid panel   LDL cholesterol, direct   TSH   Hair loss   Relevant Orders   TSH   Healthcare maintenance - Primary   Relevant Orders   CBC   Comprehensive metabolic panel   HIV Antibody (routine testing w rflx)   Hepatitis C antibody   Urinalysis, Routine w reflex microscopic   TSH      Outpatient Encounter Medications as of 01/13/2020  Medication Sig   albuterol (VENTOLIN HFA) 108 (90 Base) MCG/ACT inhaler Inhale into the lungs.   aspirin 81 MG tablet Take 81 mg by mouth daily.   betamethasone dipropionate 0.05 % cream Apply topically.   escitalopram (LEXAPRO) 20 MG tablet Take 20 mg by mouth daily.   loratadine (CLARITIN) 10 MG tablet Take 10 mg by mouth daily.   pantoprazole (PROTONIX) 40 MG tablet Take 40 mg by mouth daily.   rizatriptan (MAXALT) 10 MG tablet Take 10 mg by mouth as needed for migraine. May repeat in 2 hours if needed   rosuvastatin (CRESTOR) 10 MG tablet Take 10 mg by mouth daily.   HYDROcodone-acetaminophen (NORCO) 5-325 MG tablet Take 2 tablets by mouth every 4 (four) hours as needed for moderate pain. (Patient not taking: Reported on 01/13/2020)   meloxicam (MOBIC) 15 MG tablet Take 15 mg by mouth daily. (Patient not taking: Reported on 01/13/2020)   No facility-administered encounter medications on file as of 01/13/2020.    Follow-up: Return in about 6 months (around 07/14/2020).   Libby Maw, MD

## 2020-01-13 NOTE — Patient Instructions (Signed)
Health Maintenance, Female Adopting a healthy lifestyle and getting preventive care are important in promoting health and wellness. Ask your health care provider about:  The right schedule for you to have regular tests and exams.  Things you can do on your own to prevent diseases and keep yourself healthy. What should I know about diet, weight, and exercise? Eat a healthy diet   Eat a diet that includes plenty of vegetables, fruits, low-fat dairy products, and lean protein.  Do not eat a lot of foods that are high in solid fats, added sugars, or sodium. Maintain a healthy weight Body mass index (BMI) is used to identify weight problems. It estimates body fat based on height and weight. Your health care provider can help determine your BMI and help you achieve or maintain a healthy weight. Get regular exercise Get regular exercise. This is one of the most important things you can do for your health. Most adults should:  Exercise for at least 150 minutes each week. The exercise should increase your heart rate and make you sweat (moderate-intensity exercise).  Do strengthening exercises at least twice a week. This is in addition to the moderate-intensity exercise.  Spend less time sitting. Even light physical activity can be beneficial. Watch cholesterol and blood lipids Have your blood tested for lipids and cholesterol at 61 years of age, then have this test every 5 years. Have your cholesterol levels checked more often if:  Your lipid or cholesterol levels are high.  You are older than 61 years of age.  You are at high risk for heart disease. What should I know about cancer screening? Depending on your health history and family history, you may need to have cancer screening at various ages. This may include screening for:  Breast cancer.  Cervical cancer.  Colorectal cancer.  Skin cancer.  Lung cancer. What should I know about heart disease, diabetes, and high blood  pressure? Blood pressure and heart disease  High blood pressure causes heart disease and increases the risk of stroke. This is more likely to develop in people who have high blood pressure readings, are of African descent, or are overweight.  Have your blood pressure checked: ? Every 3-5 years if you are 54-62 years of age. ? Every year if you are 93 years old or older. Diabetes Have regular diabetes screenings. This checks your fasting blood sugar level. Have the screening done:  Once every three years after age 69 if you are at a normal weight and have a low risk for diabetes.  More often and at a younger age if you are overweight or have a high risk for diabetes. What should I know about preventing infection? Hepatitis B If you have a higher risk for hepatitis B, you should be screened for this virus. Talk with your health care provider to find out if you are at risk for hepatitis B infection. Hepatitis C Testing is recommended for:  Everyone born from [birth year] through 1965.  Anyone with known risk factors for hepatitis C. Sexually transmitted infections (STIs)  Get screened for STIs, including gonorrhea and chlamydia, if: ? You are sexually active and are younger than 61 years of age. ? You are older than 61 years of age and your health care provider tells you that you are at risk for this type of infection. ? Your sexual activity has changed since you were last screened, and you are at increased risk for chlamydia or gonorrhea. Ask your health care provider if  you are at risk. °· Ask your health care provider about whether you are at high risk for HIV. Your health care provider may recommend a prescription medicine to help prevent HIV infection. If you choose to take medicine to prevent HIV, you should first get tested for HIV. You should then be tested every 3 months for as long as you are taking the medicine. °Pregnancy °· If you are about to stop having your period (premenopausal) and  you may become pregnant, seek counseling before you get pregnant. °· Take 400 to 800 micrograms (mcg) of folic acid every day if you become pregnant. °· Ask for birth control (contraception) if you want to prevent pregnancy. °Osteoporosis and menopause °Osteoporosis is a disease in which the bones lose minerals and strength with aging. This can result in bone fractures. If you are 65 years old or older, or if you are at risk for osteoporosis and fractures, ask your health care provider if you should: °· Be screened for bone loss. °· Take a calcium or vitamin D supplement to lower your risk of fractures. °· Be given hormone replacement therapy (HRT) to treat symptoms of menopause. °Follow these instructions at home: °Lifestyle °· Do not use any products that contain nicotine or tobacco, such as cigarettes, e-cigarettes, and chewing tobacco. If you need help quitting, ask your health care provider. °· Do not use street drugs. °· Do not share needles. °· Ask your health care provider for help if you need support or information about quitting drugs. °Alcohol use °· Do not drink alcohol if: °? Your health care provider tells you not to drink. °? You are pregnant, may be pregnant, or are planning to become pregnant. °· If you drink alcohol: °? Limit how much you use to 0-1 drink a day. °? Limit intake if you are breastfeeding. °· Be aware of how much alcohol is in your drink. In the [place] one drink equals one 12 oz bottle of beer (355 mL), one 5 oz glass of wine (148 mL), or one 1½ oz glass of hard liquor (44 mL). °General instructions °· Schedule regular health, dental, and eye exams. °· Stay current with your vaccines. °· Tell your health care provider if: °? You often feel depressed. °? You have ever been abused or do not feel safe at home. °Summary °· Adopting a healthy lifestyle and getting preventive care are important in promoting health and wellness. °· Follow your health care provider's instructions about healthy  diet, exercising, and getting tested or screened for diseases. °· Follow your health care provider's instructions on monitoring your cholesterol and blood pressure. °This information is not intended to replace advice given to you by your health care provider. Make sure you discuss any questions you have with your health care provider. °Document Revised: 07/07/2018 Document Reviewed: 07/07/2018 °Elsevier Patient Education © 2020 [employer] Inc[employer] ° °Preventive Care 40-64 Years Old, Female °Preventive care refers to visits with your health care provider and lifestyle choices that can promote health and wellness. This includes: °· A yearly physical exam. This may also be called an annual well check. °· Regular dental visits and eye exams. °· Immunizations. °· Screening for certain conditions. °· Healthy lifestyle choices, such as eating a healthy diet, getting regular exercise, not using drugs or products that contain nicotine and tobacco, and limiting alcohol use. °What can I expect for my preventive care visit? °Physical exam °Your health care provider will check your: °· Height and weight. This may be used   to calculate body mass index (BMI), which tells if you are at a healthy weight. °· Heart rate and blood pressure. °· Skin for abnormal spots. °Counseling °Your health care provider may ask you questions about your: °· Alcohol, tobacco, and drug use. °· Emotional well-being. °· Home and relationship well-being. °· Sexual activity. °· Eating habits. °· Work and work environment. °· Method of birth control. °· Menstrual cycle. °· Pregnancy history. °What immunizations do I need? ° °Influenza (flu) vaccine °· This is recommended every year. °Tetanus, diphtheria, and pertussis (Tdap) vaccine °· You may need a Td booster every 10 years. °Varicella (chickenpox) vaccine °· You may need this if you have not been vaccinated. °Zoster (shingles) vaccine °· You may need this after age 60. °Measles, mumps, and rubella (MMR)  vaccine °· You may need at least one dose of MMR if you were born in 1957 or later. You may also need a second dose. °Pneumococcal conjugate (PCV13) vaccine °· You may need this if you have certain conditions and were not previously vaccinated. °Pneumococcal polysaccharide (PPSV23) vaccine °· You may need one or two doses if you smoke cigarettes or if you have certain conditions. °Meningococcal conjugate (MenACWY) vaccine °· You may need this if you have certain conditions. °Hepatitis A vaccine °· You may need this if you have certain conditions or if you travel or work in places where you may be exposed to hepatitis A. °Hepatitis B vaccine °· You may need this if you have certain conditions or if you travel or work in places where you may be exposed to hepatitis B. °Haemophilus influenzae type b (Hib) vaccine °· You may need this if you have certain conditions. °Human papillomavirus (HPV) vaccine °· If recommended by your health care provider, you may need three doses over 6 months. °You may receive vaccines as individual doses or as more than one vaccine together in one shot (combination vaccines). Talk with your health care provider about the risks and benefits of combination vaccines. °What tests do I need? °Blood tests °· Lipid and cholesterol levels. These may be checked every 5 years, or more frequently if you are over 50 years old. °· Hepatitis C test. °· Hepatitis B test. °Screening °· Lung cancer screening. You may have this screening every year starting at age 55 if you have a 30-pack-year history of smoking and currently smoke or have quit within the past 15 years. °· Colorectal cancer screening. All adults should have this screening starting at age 50 and continuing until age 75. Your health care provider may recommend screening at age 45 if you are at increased risk. You will have tests every 1-10 years, depending on your results and the type of screening test. °· Diabetes screening. This is done by  checking your blood sugar (glucose) after you have not eaten for a while (fasting). You may have this done every 1-3 years. °· Mammogram. This may be done every 1-2 years. Talk with your health care provider about when you should start having regular mammograms. This may depend on whether you have a family history of breast cancer. °· BRCA-related cancer screening. This may be done if you have a family history of breast, ovarian, tubal, or peritoneal cancers. °· Pelvic exam and Pap test. This may be done every 3 years starting at age 21. Starting at age 30, this may be done every 5 years if you have a Pap test in combination with an HPV test. °Other tests °· Sexually transmitted disease (  STD) testing. °· Bone density scan. This is done to screen for osteoporosis. You may have this scan if you are at high risk for osteoporosis. °Follow these instructions at home: °Eating and drinking °· Eat a diet that includes fresh fruits and vegetables, whole grains, lean protein, and low-fat dairy. °· Take vitamin and mineral supplements as recommended by your health care provider. °· Do not drink alcohol if: °? Your health care provider tells you not to drink. °? You are pregnant, may be pregnant, or are planning to become pregnant. °· If you drink alcohol: °? Limit how much you have to 0-1 drink a day. °? Be aware of how much alcohol is in your drink. In the [place] one drink equals one 12 oz bottle of beer (355 mL), one 5 oz glass of wine (148 mL), or one 1½ oz glass of hard liquor (44 mL). °Lifestyle °· Take daily care of your teeth and gums. °· Stay active. Exercise for at least 30 minutes on 5 or more days each week. °· Do not use any products that contain nicotine or tobacco, such as cigarettes, e-cigarettes, and chewing tobacco. If you need help quitting, ask your health care provider. °· If you are sexually active, practice safe sex. Use a condom or other form of birth control (contraception) in order to prevent pregnancy  and STIs (sexually transmitted infections). °· If told by your health care provider, take low-dose aspirin daily starting at age 50. °What's next? °· Visit your health care provider once a year for a well check visit. °· Ask your health care provider how often you should have your eyes and teeth checked. °· Stay up to date on all vaccines. °This information is not intended to replace advice given to you by your health care provider. Make sure you discuss any questions you have with your health care provider. °Document Revised: 03/25/2018 Document Reviewed: 03/25/2018 °Elsevier Patient Education © 2020 Elsevier Inc. ° °

## 2020-01-16 LAB — HEPATITIS C ANTIBODY
Hepatitis C Ab: NONREACTIVE
SIGNAL TO CUT-OFF: 0.01 (ref <1.00)

## 2020-01-16 LAB — HIV ANTIBODY (ROUTINE TESTING W REFLEX): HIV 1&2 Ab, 4th Generation: NONREACTIVE

## 2020-01-18 ENCOUNTER — Other Ambulatory Visit: Payer: Self-pay

## 2020-01-18 ENCOUNTER — Telehealth: Payer: Self-pay | Admitting: Family Medicine

## 2020-01-18 MED ORDER — ROSUVASTATIN CALCIUM 10 MG PO TABS: 10.0000 mg | ORAL_TABLET | Freq: Every day | ORAL | 0 refills | Status: DC

## 2020-01-18 NOTE — Telephone Encounter (Signed)
Patient needs refill of Crestor, please send to [facility]. Patient did not have contact info for [facility].

## 2020-01-18 NOTE — Telephone Encounter (Signed)
Refill sent in

## 2020-04-25 ENCOUNTER — Other Ambulatory Visit: Payer: Self-pay | Admitting: Family Medicine

## 2020-07-06 ENCOUNTER — Other Ambulatory Visit: Payer: Self-pay | Admitting: Family Medicine

## 2020-07-06 NOTE — Telephone Encounter (Signed)
Last OV 01/13/20 Last fill 04/25/20  #90/0

## 2020-07-11 DIAGNOSIS — M170 Bilateral primary osteoarthritis of knee: Secondary | ICD-10-CM | POA: Insufficient documentation

## 2020-07-13 ENCOUNTER — Ambulatory Visit: Payer: 59 | Admitting: Family Medicine

## 2020-08-03 ENCOUNTER — Ambulatory Visit: Payer: 59 | Admitting: Family Medicine

## 2020-10-16 ENCOUNTER — Encounter: Payer: Self-pay | Admitting: Family Medicine

## 2020-10-16 ENCOUNTER — Ambulatory Visit (INDEPENDENT_AMBULATORY_CARE_PROVIDER_SITE_OTHER): Payer: 59 | Admitting: Family Medicine

## 2020-10-16 ENCOUNTER — Other Ambulatory Visit: Payer: Self-pay

## 2020-10-16 VITALS — BP 124/82 | HR 71 | Temp 97.4°F | Ht 62.0 in | Wt 224.6 lb

## 2020-10-16 DIAGNOSIS — Z87442 Personal history of urinary calculi: Secondary | ICD-10-CM | POA: Diagnosis not present

## 2020-10-16 DIAGNOSIS — R109 Unspecified abdominal pain: Secondary | ICD-10-CM | POA: Diagnosis not present

## 2020-10-16 DIAGNOSIS — R3129 Other microscopic hematuria: Secondary | ICD-10-CM

## 2020-10-16 DIAGNOSIS — R1030 Lower abdominal pain, unspecified: Secondary | ICD-10-CM

## 2020-10-16 LAB — POCT URINALYSIS DIPSTICK
Bilirubin, UA: NEGATIVE
Glucose, UA: NEGATIVE
Ketones, UA: NEGATIVE
Leukocytes, UA: NEGATIVE
Nitrite, UA: NEGATIVE
Protein, UA: POSITIVE — AB
Spec Grav, UA: >=1.03 — AB (ref 1.010–1.025)
Urobilinogen, UA: NEGATIVE E.U./dL — AB
pH, UA: 6 (ref 5.0–8.0)

## 2020-10-16 MED ORDER — HYDROCODONE-ACETAMINOPHEN 5-325 MG PO TABS: 2.0000 | ORAL_TABLET | ORAL | 0 refills | Status: DC | PRN

## 2020-10-16 NOTE — Progress Notes (Signed)
[facility] [address] [place] [address] Dept: [phone] Dept Fax: [phone]  Acute Office Visit  Subjective:    Patient ID: Natalie [name], female    DOB: 1961-05-24, 60 y.o..   MRN: [id number]  Chief Complaint  Patient presents with  . Acute Visit    C/o having low back/low abdominal pain x 1 month, and some discomfort with last void of urine x 4 days.   Declines flu shot.     History of Present Illness:  Patient is in today for evaluation of some urinary urgency over the past 3 days. She notes she has had some low back pain and lower abdominal pain over the past month. This has been associated with nausea. She has noted only mild dysuria. She admits that she has not been pushing fluids. She does have a past history of kidney stones, which she was able to pass without difficulty. She notes a more remote history of being found with some abnormality of her GU system after a cystoscopy.  Past Medical History: Patient Active Problem List   Diagnosis Date Noted  . History of nephrolithiasis 10/16/2020  . Arthritis of both knees 07/11/2020  . Hair loss 01/13/2020  . Healthcare maintenance 01/13/2020  . Mixed hyperlipidemia 09/22/2017  . Migraines 09/22/2017  . Morbid obesity due to excess calories (HCC) 05/26/2016  . Irritable bowel syndrome with diarrhea 02/06/2015  . GERD (gastroesophageal reflux disease) 09/09/2013  . Endometriosis 09/09/2013   Past Surgical History:  Procedure Laterality Date  . CARPAL TUNNEL RELEASE Right 10/03/2016   Procedure: Right limited open CARPAL TUNNEL RELEASE;  Surgeon: Dominica Severin, MD;  Location: [facility];  Service: Orthopedics;  Laterality: Right;  . CESAREAN SECTION    . CHOLECYSTECTOMY  05/27/2016  . COLONOSCOPY WITH PROPOFOL  04/20/2015  . CYSTO    . INCISION / DEBRIDEMENT BONE ELBOW Right 06/14/2003   radial head  . KNEE ARTHROSCOPY Left 01/22/2010  .  SALPINGOOPHORECTOMY Left 03/31/2002  . STERIOD INJECTION Left 10/03/2016   Procedure: STEROID INJECTION;  Surgeon: Dominica Severin, MD;  Location:  SURGERY CENTER;  Service: Orthopedics;  Laterality: Left;  . TOTAL ABDOMINAL HYSTERECTOMY  03/31/2002   Family History  Problem Relation Age of Onset  . Anesthesia problems Mother        post-op N/V  . Anesthesia problems Other        post-op N/V   Outpatient Medications Prior to Visit  Medication Sig Dispense Refill  . albuterol (VENTOLIN HFA) 108 (90 Base) MCG/ACT inhaler Inhale into the lungs.    Marland Kitchen aspirin 81 MG tablet Take 81 mg by mouth daily.    . betamethasone dipropionate 0.05 % cream Apply topically.    Marland Kitchen escitalopram (LEXAPRO) 5 MG tablet Take 5 mg by mouth daily.    Marland Kitchen loratadine (CLARITIN) 10 MG tablet Take 10 mg by mouth daily.    . pantoprazole (PROTONIX) 40 MG tablet Take 40 mg by mouth daily.    . rizatriptan (MAXALT) 10 MG tablet Take 10 mg by mouth as needed for migraine. May repeat in 2 hours if needed    . rosuvastatin (CRESTOR) 10 MG tablet TAKE 1 TABLET DAILY 90 tablet 3  . escitalopram (LEXAPRO) 20 MG tablet Take 20 mg by mouth daily.    . valACYclovir (VALTREX) 500 MG tablet valacyclovir 500 mg tablet  TAKE 1 TABLET BY MOUTH TWICE DAILY X3 DAYS AS NEEDED FOR GENITAL LESIONS    .  HYDROcodone-acetaminophen (NORCO) 5-325 MG tablet Take 2 tablets by mouth every 4 (four) hours as needed for moderate pain. (Patient not taking: No sig reported) 30 tablet 0  . meloxicam (MOBIC) 15 MG tablet Take 15 mg by mouth daily. (Patient not taking: No sig reported)     No facility-administered medications prior to visit.   Allergies  Allergen Reactions  . Lipitor [Atorvastatin] Other (See Comments)    MYALGIA     Objective:   Today's Vitals   10/16/20 1602  BP: 124/82  Pulse: 71  Temp: (!) 97.4 F (36.3 C)  TempSrc: Temporal  SpO2: 98%  Weight: 224 lb 9.6 oz (101.9 kg)  Height: 5\' 2"  (1.575 m)   Body mass index  is 41.08 kg/m.   General: Well developed, well nourished. No acute distress. Back: Straight. No CVA tenderness bilaterally. Psych: Alert and oriented. Normal mood and affect.  Health Maintenance Due  Topic Date Due  . TETANUS/TDAP  Never done  . PAP SMEAR-Modifier  Never done  . MAMMOGRAM  Never done  . INFLUENZA VACCINE  02/26/2020  . COVID-19 Vaccine (3 - Booster for Moderna series) 09/27/2020    Lab Results Urinalysis Component Ref Range & Units 16:15  Color, UA  dark yellow   Clarity, UA  cloudy   Glucose, UA Negative Negative   Bilirubin, UA  neg   Ketones, UA  neg   Spec Grav, UA 1.010 - 1.025 >=1.030Abnormal   Blood, UA  2+   pH, UA 5.0 - 8.0 6.0   Protein, UA Negative PositiveAbnormal   Urobilinogen, UA 0.2 or 1.0 E.U./dL negativeAbnormal   Nitrite, UA  neg   Leukocytes, UA Negative Negative    Assessment & Plan:   1. Flank pain 2. Other microscopic hematuria 3. History of nephrolithiasis  Ms. Steeber has flank pain and hematuria. With her past history of nephrolithiasis, I am concerned she has a possible recurrent stone. I recommend she push fluids. I will provide a small supply of Norco to have on hand for pain. I will order a CT of the abdomen and pelvis without contrast to assess.  - POCT Urinalysis Dipstick - HYDROcodone-acetaminophen (NORCO) 5-325 MG tablet; Take 2 tablets by mouth every 4 (four) hours as needed for moderate pain.  Dispense: 30 tablet; Refill: 0 - CT Abdomen Pelvis Wo Contrast; Future  11/27/2020, MD

## 2020-10-17 ENCOUNTER — Telehealth: Payer: Self-pay

## 2020-10-17 NOTE — Telephone Encounter (Signed)
Pt is calling about her CT referral. She thinks she has a kidney stone and they can work her in today but they're tellinig her we sent the incorrect insurance info and her insurance denied the referral. She is in a lot of pain and would like the expedited, if possible. I couldn't understand where she said the referral was sent and then call dropped.

## 2020-10-18 NOTE — Telephone Encounter (Signed)
I called pt and she said that she was called by GI and they told her she could come in yesterday and said she had got ready for appt and then she was told by them that auth had been denied because we had not sent in the right information, I advised her that was misinformation because we had not submitted for auth yet due to the order being ordered at the very end of the day before but that I had submitted it right before I called her. Berkley Harvey has been approved now so I contacted pt to let her know and had to leave her a message. I left her Gi's number to call [phone] and our number in case she needed to call us back.

## 2020-10-19 ENCOUNTER — Telehealth: Payer: Self-pay | Admitting: Family Medicine

## 2020-10-19 ENCOUNTER — Other Ambulatory Visit: Payer: Self-pay

## 2020-10-19 ENCOUNTER — Ambulatory Visit
Admission: RE | Admit: 2020-10-19 | Discharge: 2020-10-19 | Disposition: A | Discharge status: Home / Self Care | Payer: 59 | Source: Ambulatory Visit | Attending: Family Medicine | Admitting: Family Medicine

## 2020-10-19 ENCOUNTER — Encounter: Payer: Self-pay | Admitting: Family Medicine

## 2020-10-19 DIAGNOSIS — R1030 Lower abdominal pain, unspecified: Secondary | ICD-10-CM

## 2020-10-19 DIAGNOSIS — I700 Atherosclerosis of aorta: Secondary | ICD-10-CM | POA: Insufficient documentation

## 2020-10-19 IMAGING — CT CT ABD-PELV W/O CM
1 of 2 series · 14 of 32 positions shown, 19 images · non-contrast
Comparison: [DATE]

CLINICAL DATA: Right flank pain

EXAM:
CT ABDOMEN AND PELVIS WITHOUT CONTRAST
TECHNIQUE: Multidetector CT imaging of the abdomen and pelvis was performed
following the standard protocol without IV contrast.

[Series 2: abd/pelvis w/(date) · axial · 0.91mm/px · z∈[-528,-108]mm · 14 of 96 slices shown, 19 images]
[im 6/96  soft-tissue]
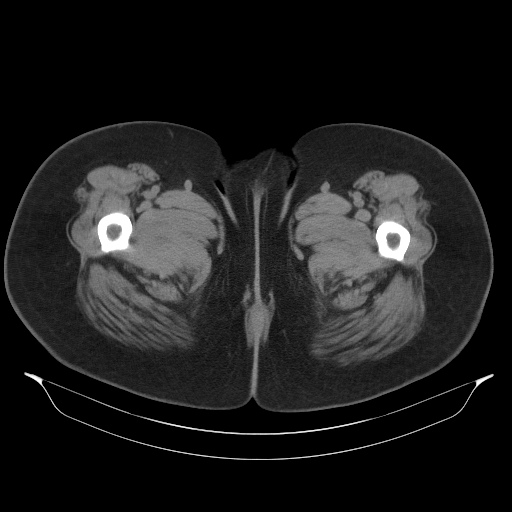
[im 6/96  bone]
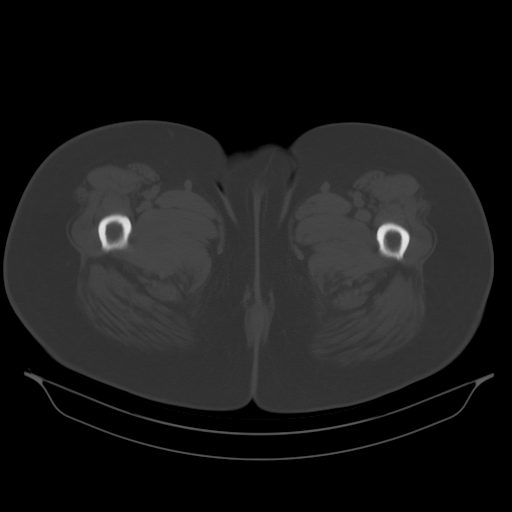
[im 11/96  soft-tissue]
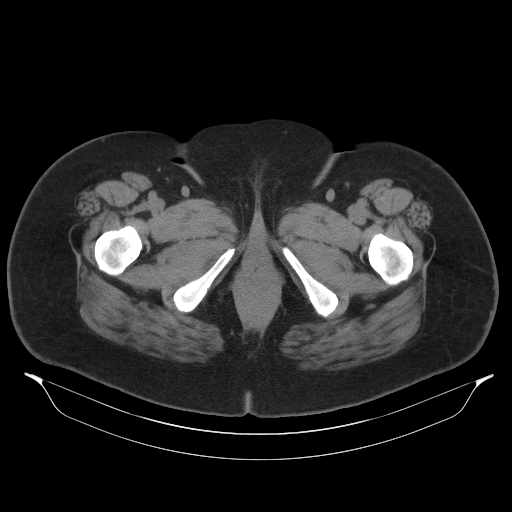
[im 22/96  soft-tissue]
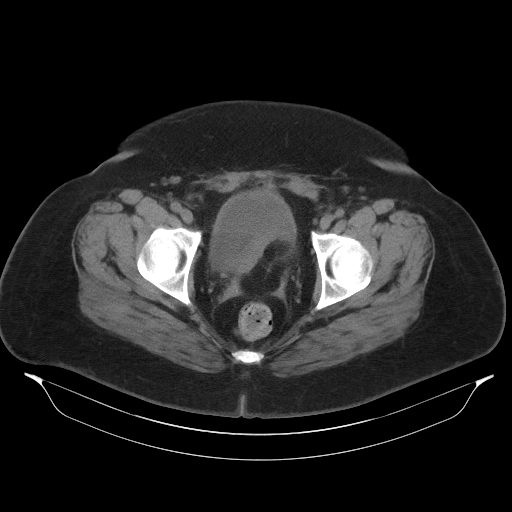
[im 27/96  soft-tissue]
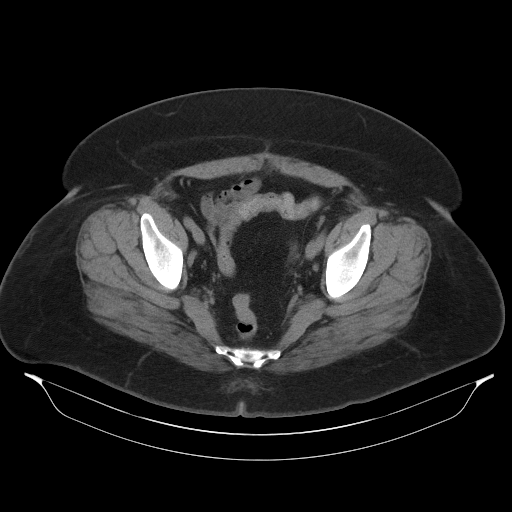
[im 32/96  soft-tissue]
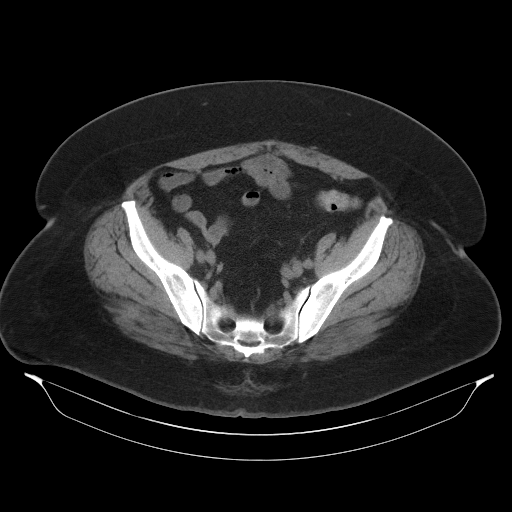
[im 43/96  soft-tissue]
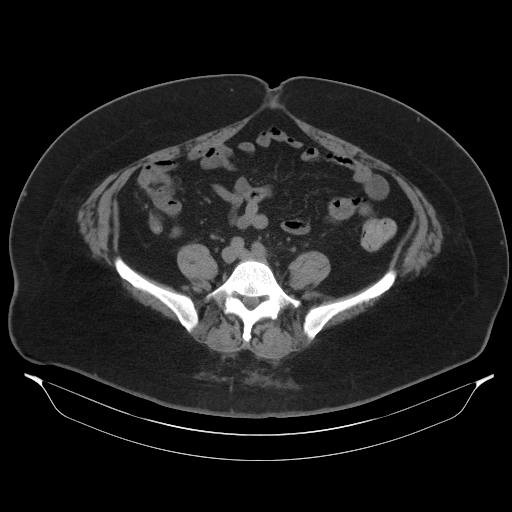
[im 48/96  soft-tissue]
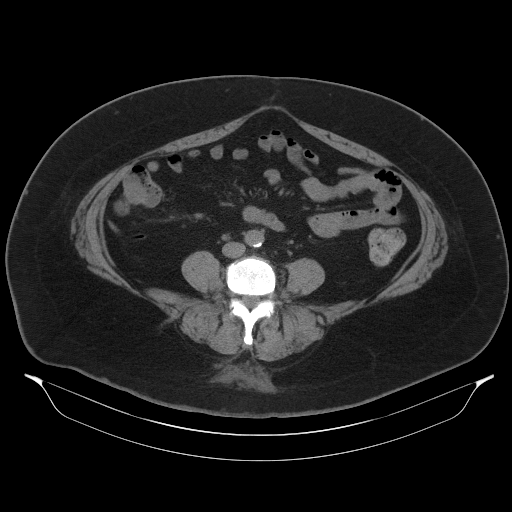
[im 53/96  soft-tissue]
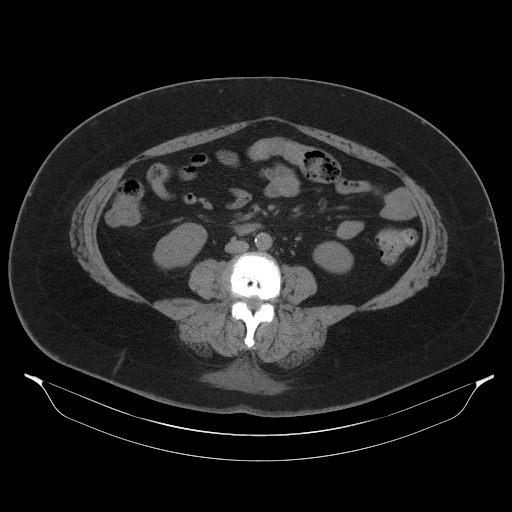
[im 64/96  soft-tissue]
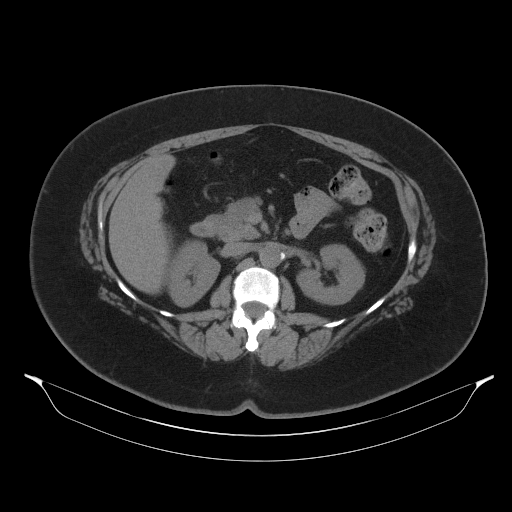
[im 64/96  bone]
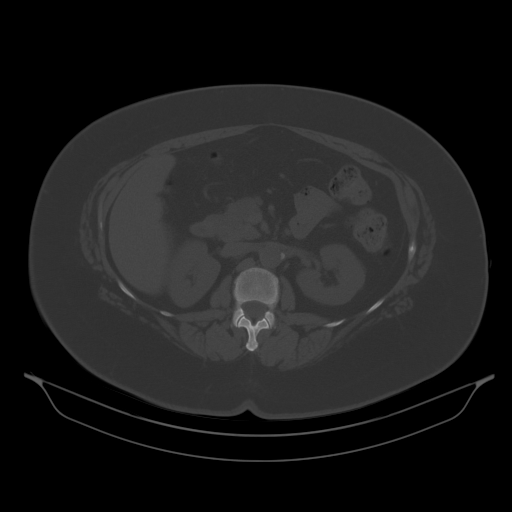
[im 69/96  soft-tissue]
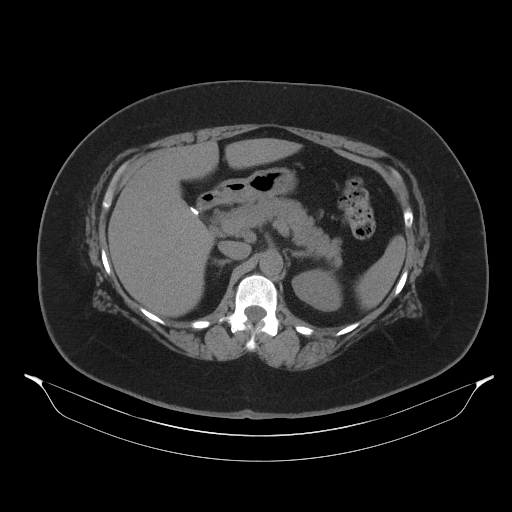
[im 74/96  soft-tissue]
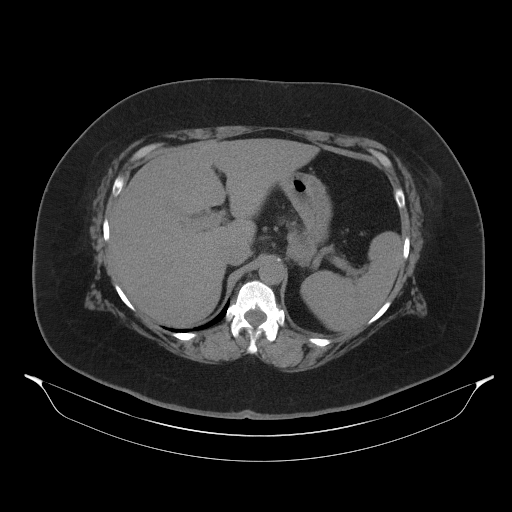
[im 74/96  lung]
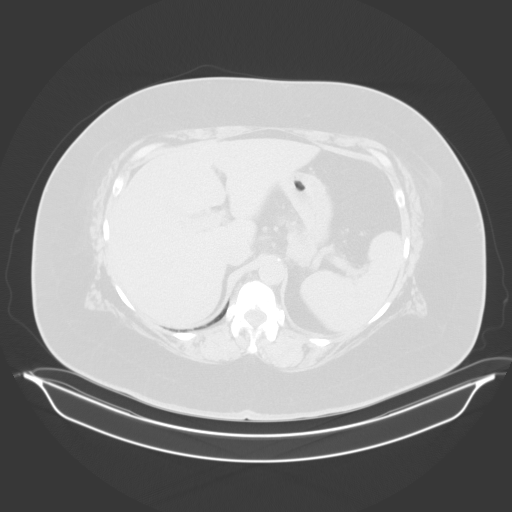
[im 80/96  lung]
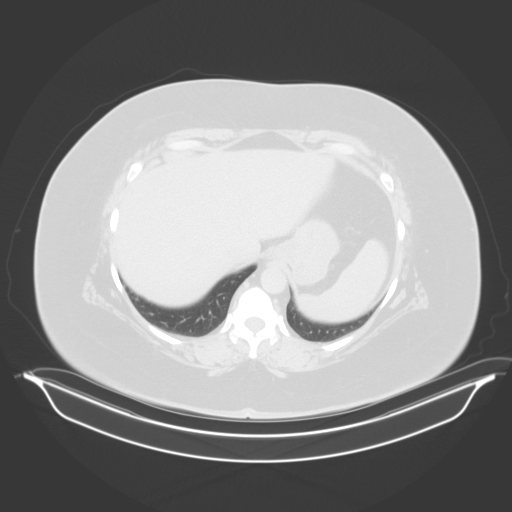
[im 85/96  soft-tissue]
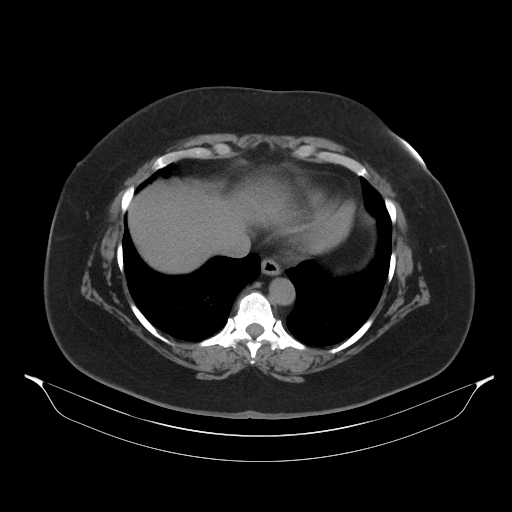
[im 85/96  lung]
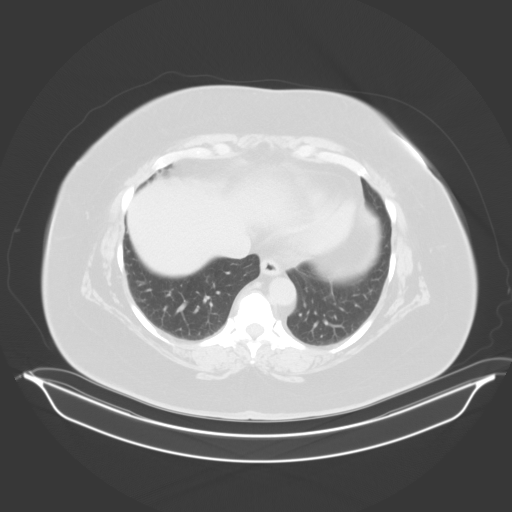
[im 90/96  soft-tissue]
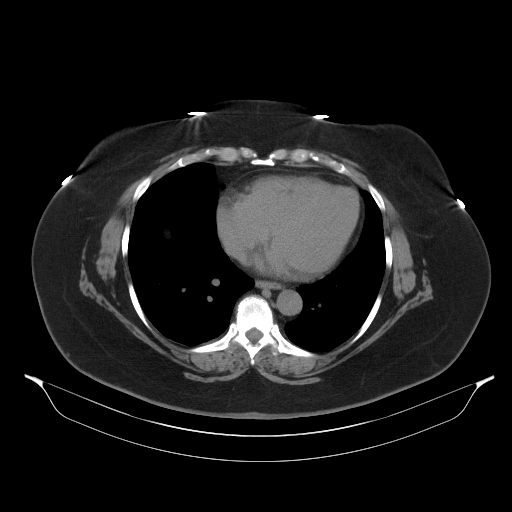
[im 90/96  lung]
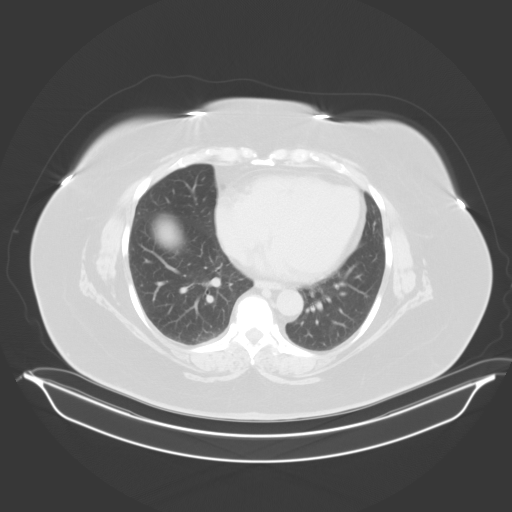

[14 of 32 positions shown; findings below may reference images not displayed]

FINDINGS: Lower chest: No acute abnormality.

Hepatobiliary: Unenhanced liver appears within normal limits. No
focal abnormality. Prior cholecystectomy. No biliary dilatation.

Pancreas: Unremarkable. No pancreatic ductal dilatation or
surrounding inflammatory changes.

Spleen: Normal in size without focal abnormality.

Adrenals/Urinary Tract: Unremarkable adrenal glands. Bilateral
kidneys appear within normal limits. No focal lesion, stone, or
hydronephrosis. No perinephric stranding. Bilateral ureters are
unremarkable. No ureteral calculi. Mild diffuse urinary bladder wall
thickening with mild fat stranding anteriorly.

Stomach/Bowel: Stomach appears unremarkable. Small bowel within
normal limits. No dilated loops of bowel to suggest obstruction.
Normal appendix within the right hemiabdomen (series 2, image 48).
Mild bowel wall thickening noted within the mid transverse colon and
distal sigmoid colon. No pericolonic fat stranding.

Vascular/Lymphatic: Scattered aortoiliac atherosclerotic
calcifications without aneurysm. No abdominopelvic lymphadenopathy.

Reproductive: Status post hysterectomy. No adnexal masses.

Other: No free fluid. No abdominopelvic fluid collection. No
pneumoperitoneum. No abdominal wall hernia.

Musculoskeletal: Degenerative disc disease of L3-4 with vacuum disc
phenomenon. No acute osseous findings. No suspicious bone lesion.
IMPRESSION: 1. Mild diffuse urinary bladder wall thickening with mild fat
stranding. Correlate with urinalysis to assess for cystitis.
2. No evidence of obstructive uropathy.
3. Mild bowel wall thickening within the mid transverse colon and
distal sigmoid colon without pericolonic fat stranding. Findings may
represent a mild, nonspecific colitis.
4. Aortic atherosclerosis ([G5]-[G5]).

## 2020-10-19 NOTE — Telephone Encounter (Signed)
Patient had OV with Dr. Veto Kemps on 3/22/222 he ordered CT patient calling with results she was able to view online but not sure if she will need any medications. Patient aware that Dr. Veto Kemps or her PCP Dr. Doreene Burke not in the office as of now but she would like to know before the weekend if any medications are needed. Please advise.

## 2020-10-19 NOTE — Telephone Encounter (Signed)
Spoke with patient made aware of message below. Patient states that she would like for this to go away and would like a antibiotic sent in. [facility] to patient antibiotic not needed at this time. Per patient she have not taken any of the medications that was prescribed for pain because she did not want to take them. Encouraged patient that this should hel with her pains. If she does not feel better over the weekend to be seen at ED or give [place] a call for follow up on Monday.

## 2020-10-19 NOTE — Telephone Encounter (Signed)
Pt would like a cb concerning her most recent CT Abdomen Pelvis Wo Contrast. Please advise at [phone].

## 2020-10-19 NOTE — Telephone Encounter (Signed)
CT does not show any kidney stones or evidence of kidney swelling/enlargement or infection.  It shows some mild inflammation of your bladder but UA was normal/no sign of infection so I do not this this is a UTI. Pt also has some mild inflammation of her colon but in the absence of fever, diarrhea, lower abdominal pain I do not think she is infectious/require abx therapy at this time. If pt is still having back/flank pain, would recommend f/u appt with PCP or Dr. Veto Kemps

## 2020-10-22 ENCOUNTER — Encounter: Payer: Self-pay | Admitting: Family Medicine

## 2020-10-22 DIAGNOSIS — R3129 Other microscopic hematuria: Secondary | ICD-10-CM

## 2020-10-22 DIAGNOSIS — R109 Unspecified abdominal pain: Secondary | ICD-10-CM

## 2021-01-22 ENCOUNTER — Ambulatory Visit: Payer: 59 | Admitting: Family Medicine

## 2021-03-01 ENCOUNTER — Ambulatory Visit: Payer: 59 | Admitting: Family Medicine

## 2021-03-04 ENCOUNTER — Telehealth: Payer: Self-pay | Admitting: Family Medicine

## 2021-03-04 NOTE — Telephone Encounter (Signed)
Pt called requesting Rx Rosuvastitin info be sent to [facility] [phone])

## 2021-03-05 ENCOUNTER — Other Ambulatory Visit: Payer: Self-pay

## 2021-03-05 DIAGNOSIS — E7800 Pure hypercholesterolemia, unspecified: Secondary | ICD-10-CM

## 2021-03-05 MED ORDER — ROSUVASTATIN CALCIUM 10 MG PO TABS: 10.0000 mg | ORAL_TABLET | Freq: Every day | ORAL | 0 refills | Status: DC

## 2021-03-05 NOTE — Telephone Encounter (Signed)
Done/patient aware. 

## 2021-04-12 ENCOUNTER — Ambulatory Visit: Payer: 59 | Admitting: Family Medicine

## 2021-06-14 LAB — HM MAMMOGRAPHY

## 2021-06-17 ENCOUNTER — Other Ambulatory Visit: Payer: Self-pay | Admitting: Family Medicine

## 2021-06-17 DIAGNOSIS — E7800 Pure hypercholesterolemia, unspecified: Secondary | ICD-10-CM

## 2021-07-11 ENCOUNTER — Other Ambulatory Visit: Payer: Self-pay

## 2021-07-12 ENCOUNTER — Encounter: Payer: Self-pay | Admitting: Family Medicine

## 2021-07-12 ENCOUNTER — Ambulatory Visit (INDEPENDENT_AMBULATORY_CARE_PROVIDER_SITE_OTHER): Payer: 59 | Admitting: Family Medicine

## 2021-07-12 ENCOUNTER — Ambulatory Visit (INDEPENDENT_AMBULATORY_CARE_PROVIDER_SITE_OTHER): Payer: 59

## 2021-07-12 VITALS — BP 138/100 | HR 80 | Temp 97.4°F | Ht 62.0 in | Wt 210.6 lb

## 2021-07-12 DIAGNOSIS — R058 Other specified cough: Secondary | ICD-10-CM

## 2021-07-12 DIAGNOSIS — M5116 Intervertebral disc disorders with radiculopathy, lumbar region: Secondary | ICD-10-CM

## 2021-07-12 DIAGNOSIS — R030 Elevated blood-pressure reading, without diagnosis of hypertension: Secondary | ICD-10-CM

## 2021-07-12 IMAGING — DX DG LUMBAR SPINE COMPLETE 4+V
5 series · 5 of 5 positions shown · non-contrast
Comparison: CT [DATE] and previous

CLINICAL DATA: Left L3-4 radiculopathy

EXAM:
LUMBAR SPINE - COMPLETE 4+ VIEW

[lumbar spine ap]
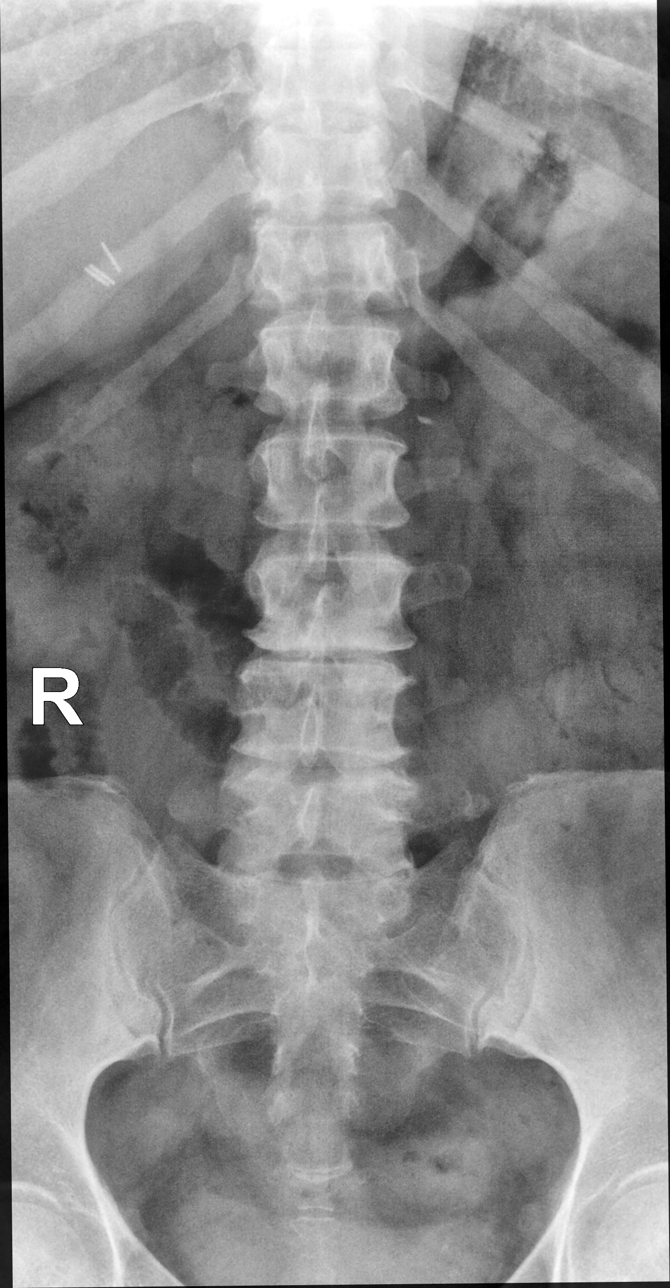

[lumbar spine lmo (1 of 2)]
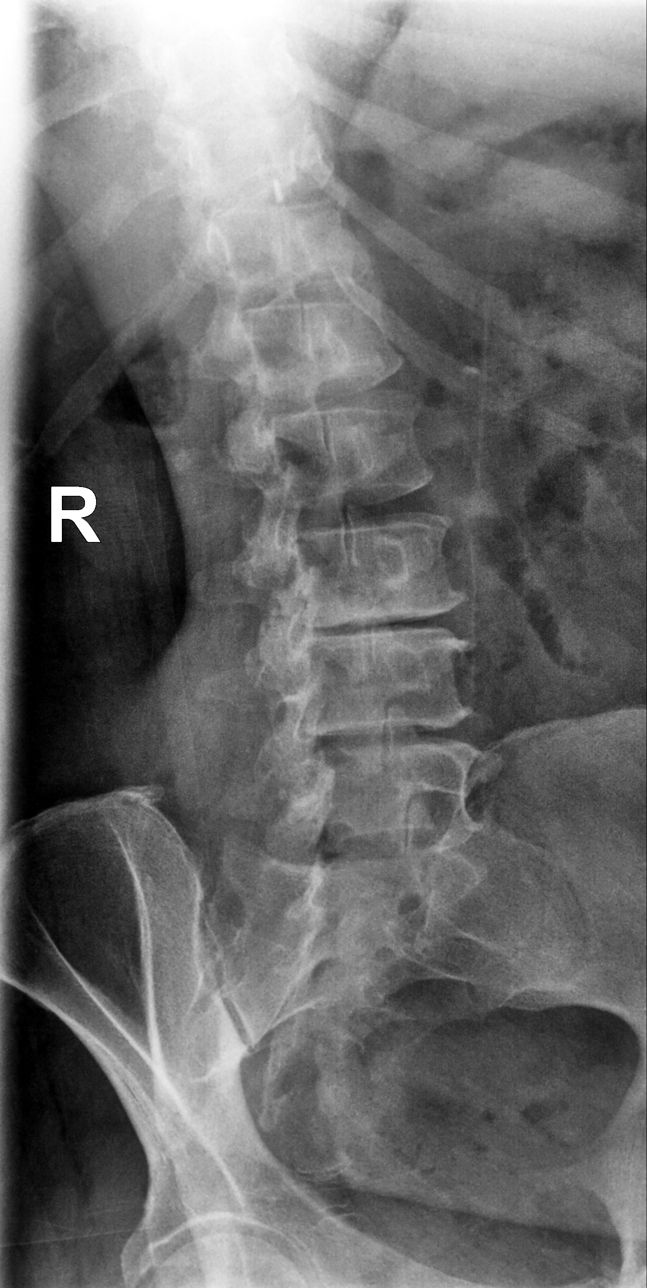

[lumbar spine lmo (2 of 2)]
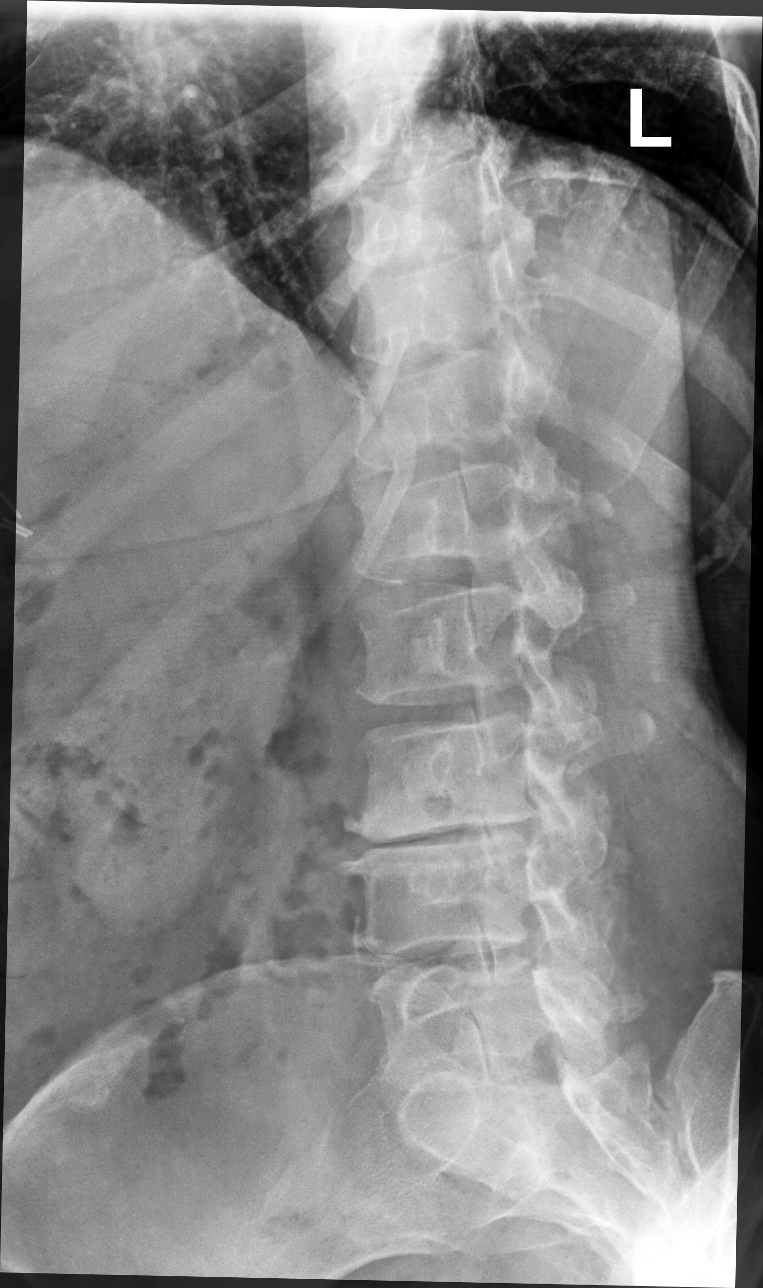

[lumbar spine lat (1 of 2)]
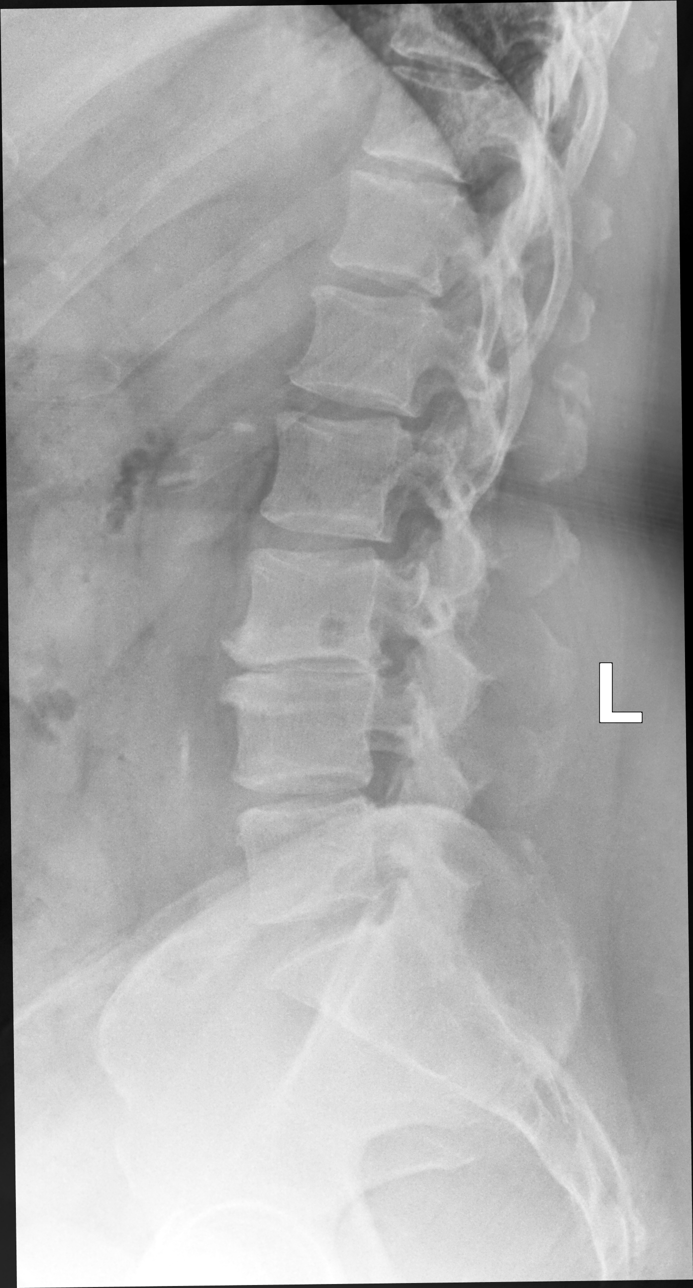

[lumbar spine lat (2 of 2)]
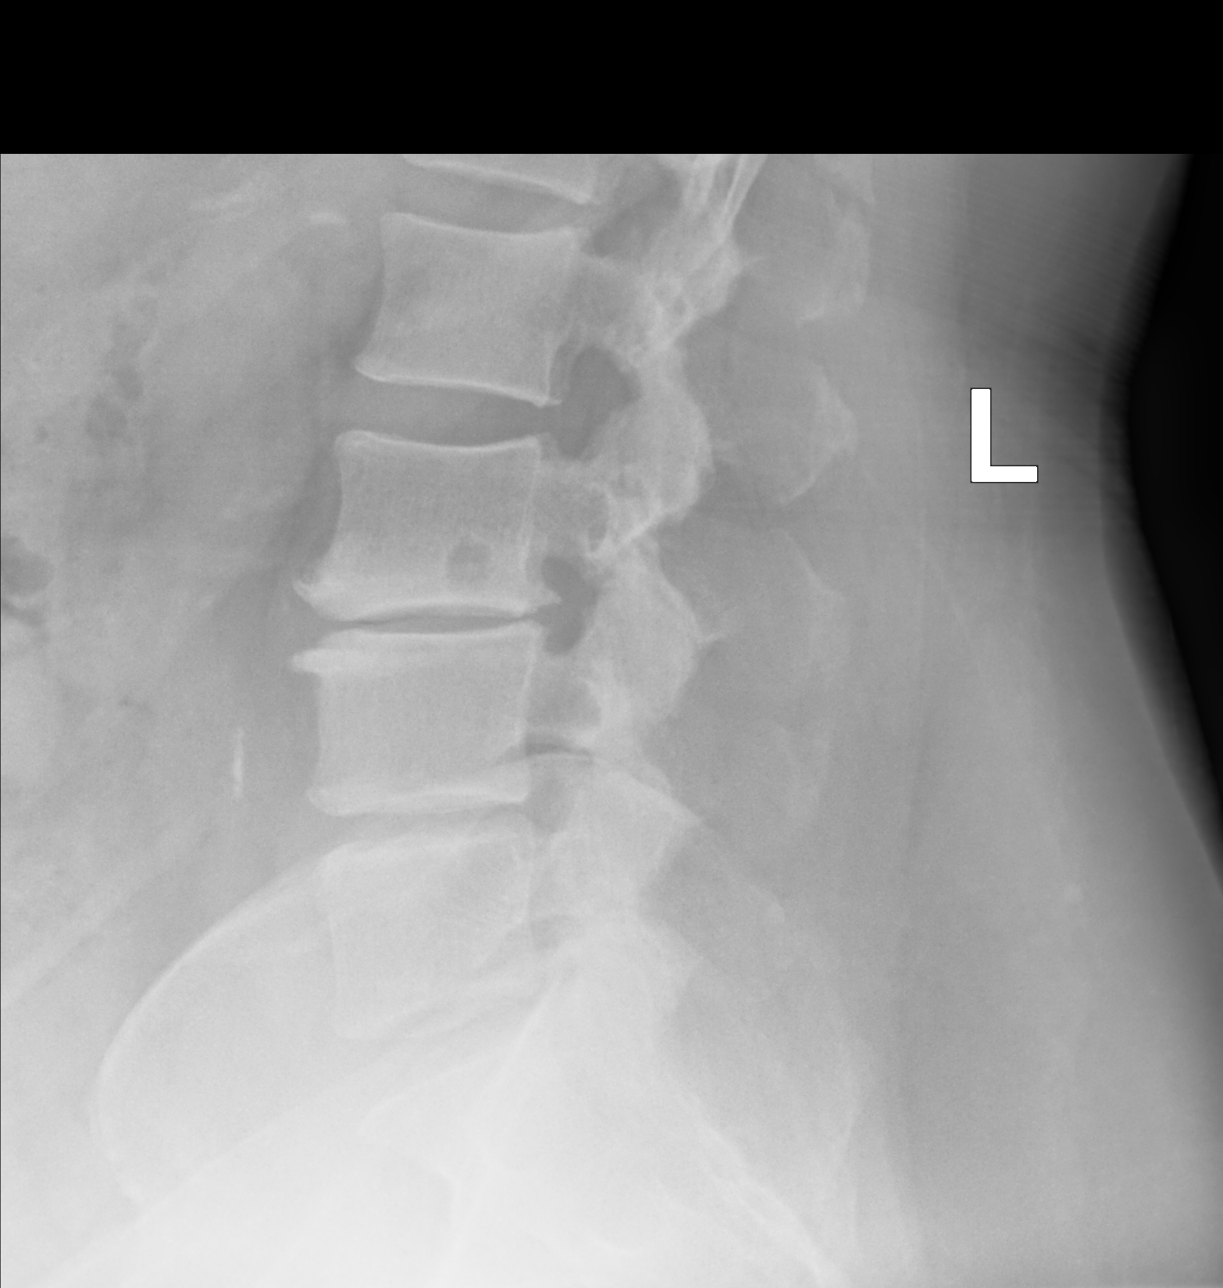

[5 of 5 positions shown; findings below may reference images not displayed]

FINDINGS: Normal alignment. No fracture or dislocation. Moderate narrowing
L3-4 with anterior and posterior endplate spurring. Mild narrowing
L4-5 stable. Cholecystectomy clips. Scattered aortic calcifications.
IMPRESSION: 1. No acute findings.
2. Degenerative disc disease L3-L5 as before.
3.  Aortic Atherosclerosis ([DK]-170.0).

## 2021-07-12 IMAGING — DX DG CHEST 2V
2 series · 2 of 2 positions shown · non-contrast
Comparison: [DATE]

CLINICAL DATA: Post viral cough.

EXAM:
CHEST - 2 VIEW

[chest pa]
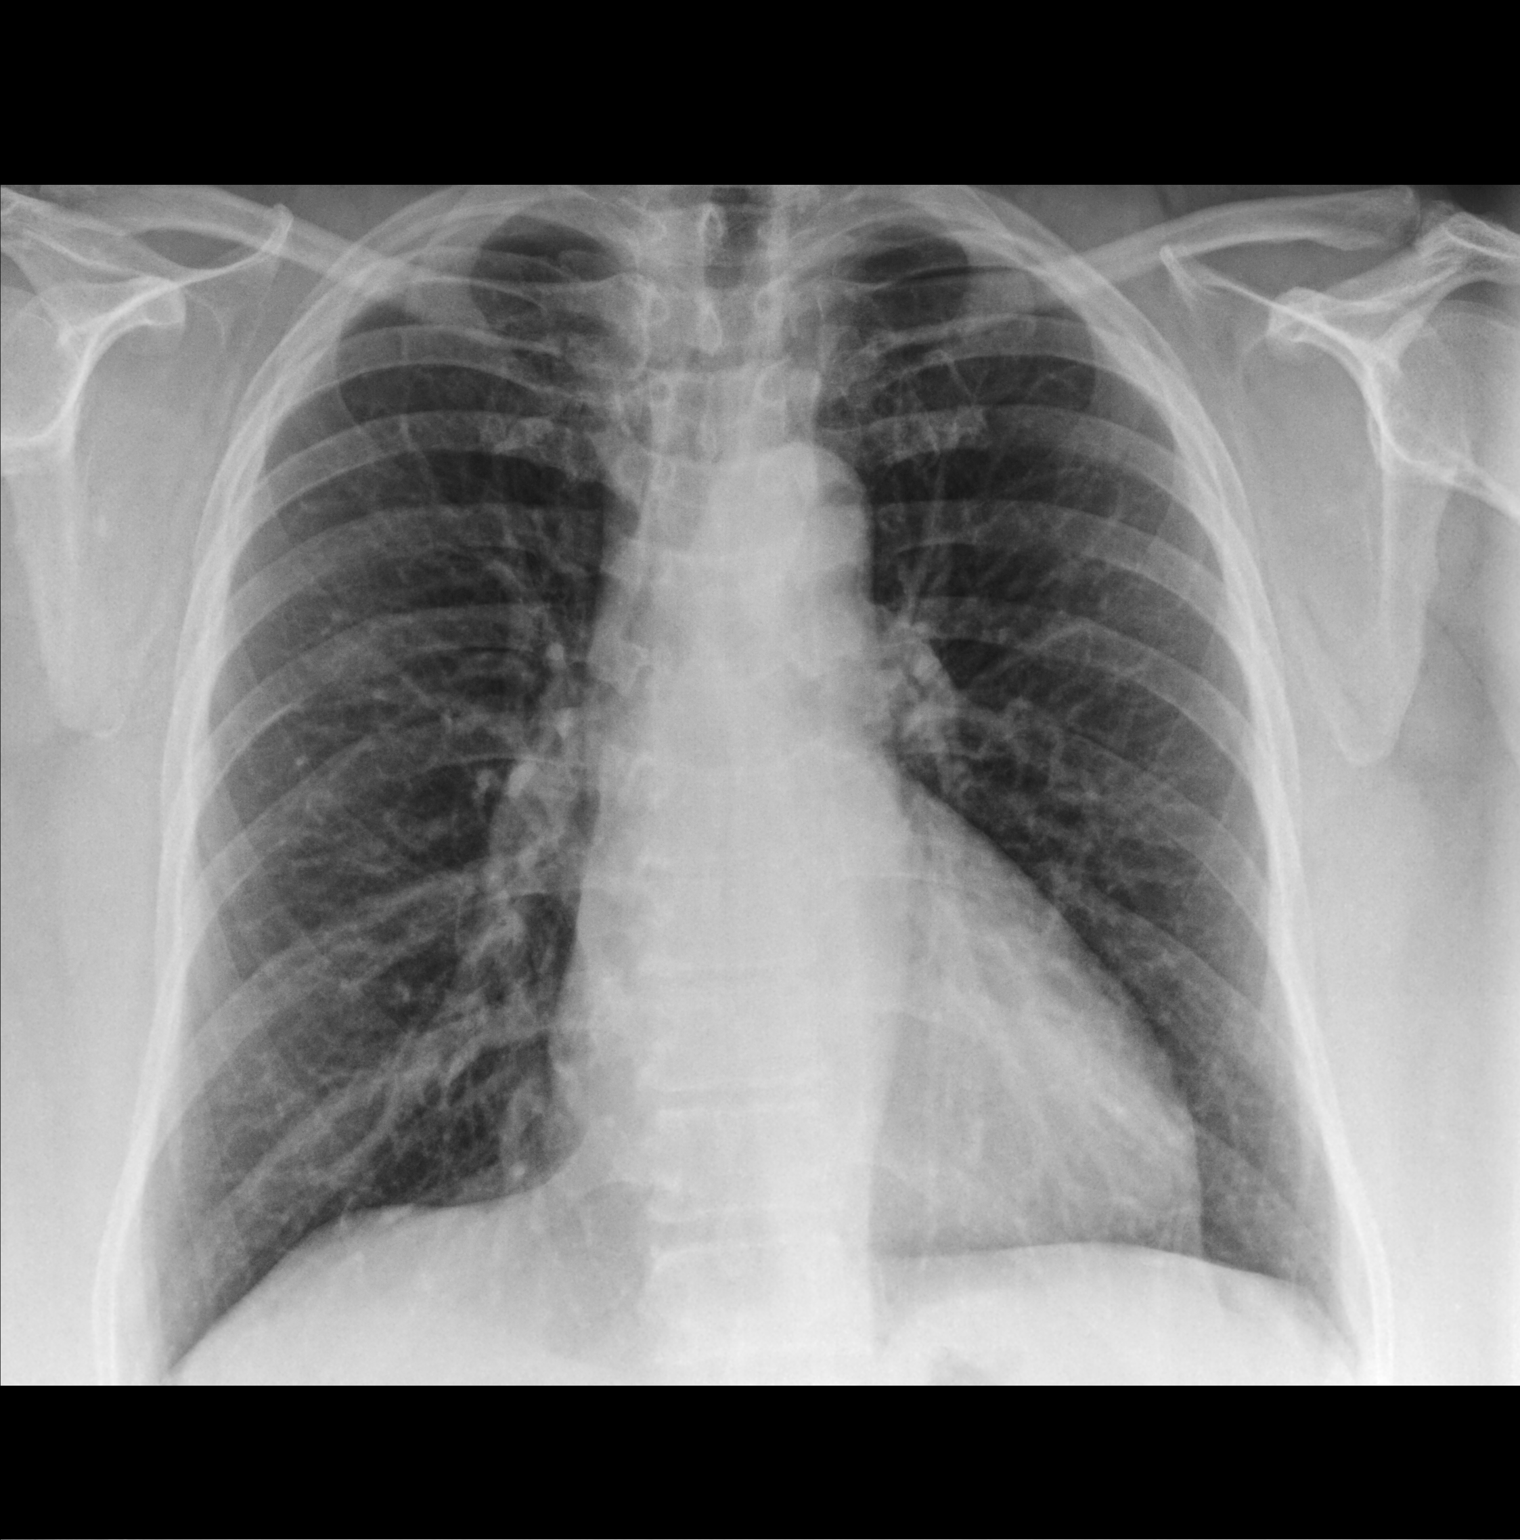

[chest lat]
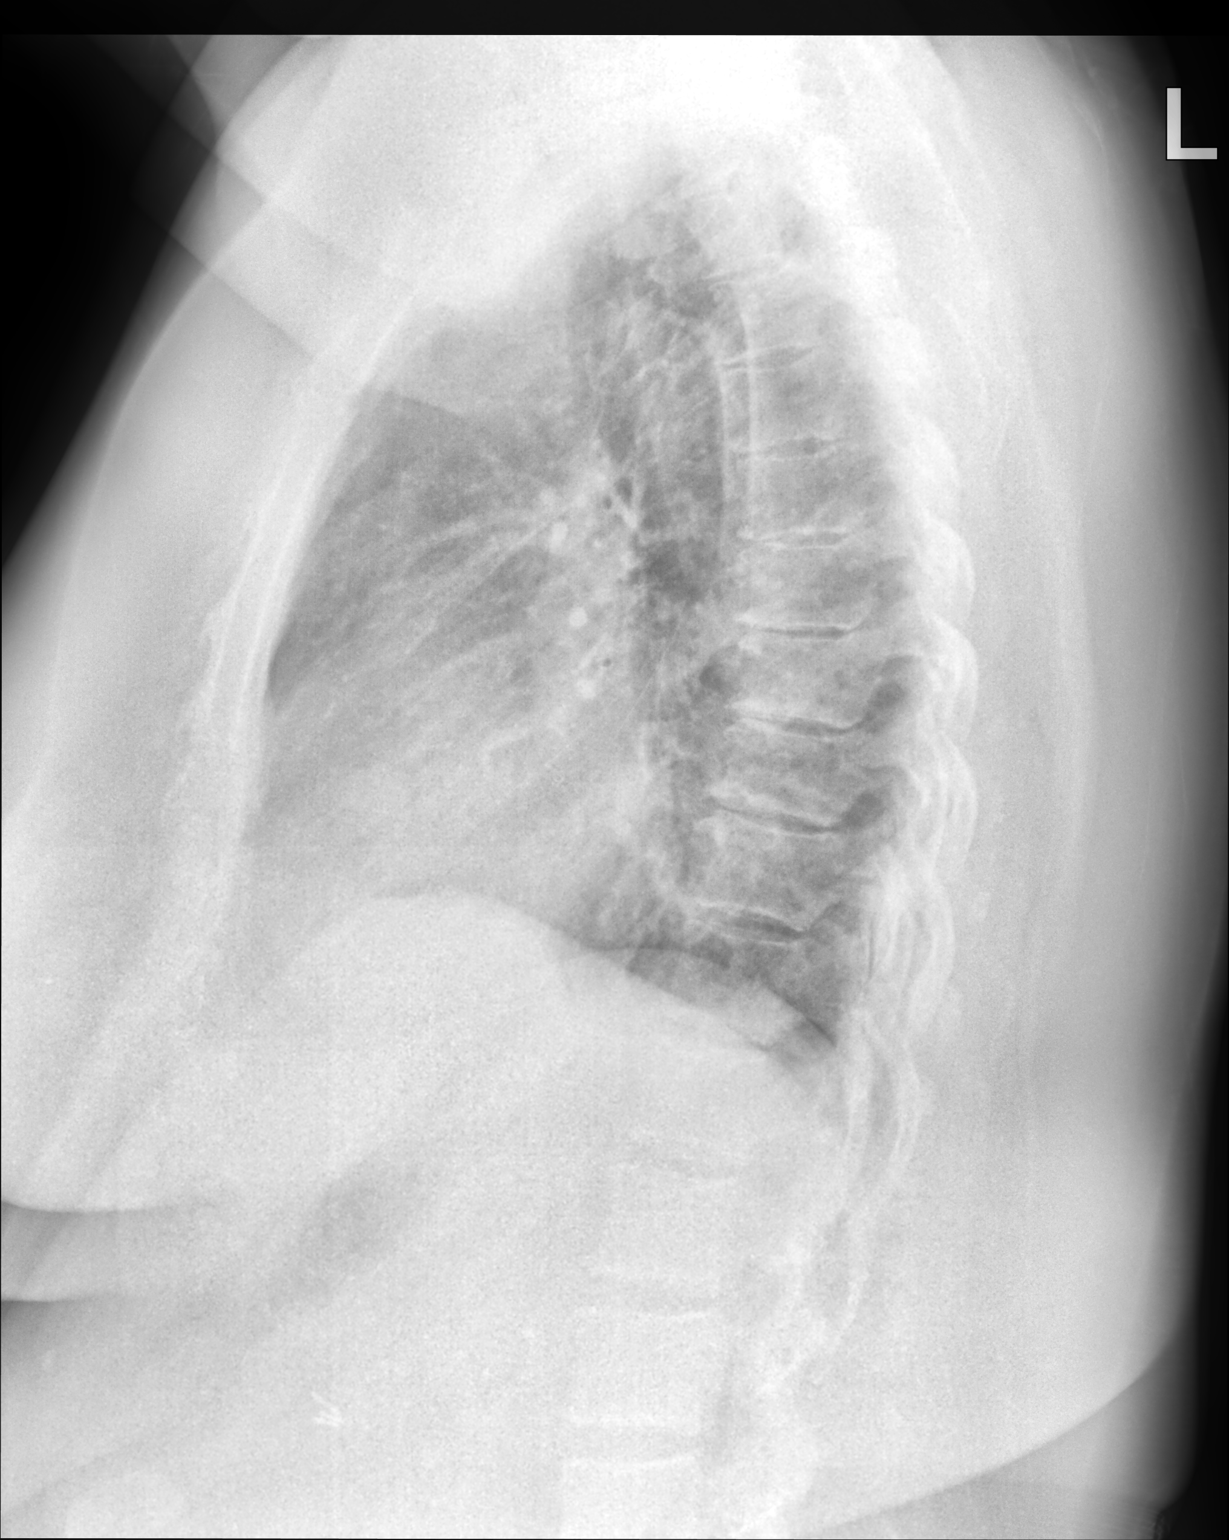

[2 of 2 positions shown; findings below may reference images not displayed]

FINDINGS: The heart size and mediastinal contours are within normal limits.
Both lungs are clear. Radiopaque surgical clips are seen within the
right upper quadrant. The visualized skeletal structures are
unremarkable.
IMPRESSION: No active cardiopulmonary disease.

## 2021-07-12 MED ORDER — PREDNISONE 10 MG (48) PO TBPK: ORAL_TABLET | ORAL | 0 refills | Status: DC

## 2021-07-12 MED ORDER — METHOCARBAMOL 500 MG PO TABS: 500.0000 mg | ORAL_TABLET | Freq: Three times a day (TID) | ORAL | 0 refills | Status: DC | PRN

## 2021-07-12 NOTE — Progress Notes (Signed)
Established Patient Office Visit  Subjective:  Patient ID: Natalie [name], female    DOB: [birth year]  Age: 60 y.o. MRN: [id number]  CC:  Chief Complaint  Patient presents with   Follow-up    Pre-op f/u. Pt c/o lower back pain with numbness and tingling feeling in left leg x2 days.     HPI Natalie [name] presents for preop clearance and follow-up of some more recent problems.  Status post URI a few weeks ago that has mostly resolved save lingering dry cough.  There is no wheezing shortness of breath fever or phlegm production.  She has no history of asthma.  She quit smoking 30 years ago.  Also in the last day or so she has developed left lower back pain associated with tingling in her inner left leg down into the foot.  There was no injury.  Distant history of back strains.  No change in bowel or bladder function.  There is no weakness.  No history of hypertension.  Does not drink alcohol, use illicit drugs.  Right knee replacement is scheduled for January 17.  Past Medical History:  Diagnosis Date   Abnormally small mouth    Arthritis    knees   Carpal tunnel syndrome on right 09/2016   Dental crowns present    also dental caps   Family history of adverse reaction to anesthesia    pt's mother and son have hx. of post-op N/V   GERD (gastroesophageal reflux disease)    Hx of migraines    Hyperlipidemia    IBS (irritable bowel syndrome)    PONV (postoperative nausea and vomiting)    1 episode only    Past Surgical History:  Procedure Laterality Date   CARPAL TUNNEL RELEASE Right 10/03/2016   Procedure: Right limited open CARPAL TUNNEL RELEASE;  Surgeon: Roseanne Kaufman, MD;  Location: [facility];  Service: Orthopedics;  Laterality: Right;   CESAREAN SECTION     CHOLECYSTECTOMY  05/27/2016   COLONOSCOPY WITH PROPOFOL  04/20/2015   CYSTO     INCISION / DEBRIDEMENT BONE ELBOW Right 06/14/2003   radial head   KNEE ARTHROSCOPY Left 01/22/2010   SALPINGOOPHORECTOMY  Left 03/31/2002   STERIOD INJECTION Left 10/03/2016   Procedure: STEROID INJECTION;  Surgeon: Roseanne Kaufman, MD;  Location: [facility];  Service: Orthopedics;  Laterality: Left;   TOTAL ABDOMINAL HYSTERECTOMY  03/31/2002    Family History  Problem Relation Age of Onset   Anesthesia problems Mother        post-op N/V   Anesthesia problems Other        post-op N/V    Social History   Socioeconomic History   Marital status: Married    Spouse name: Not on file   Number of children: Not on file   Years of education: Not on file   Highest education level: Not on file  Occupational History   Not on file  Tobacco Use   Smoking status: Never   Smokeless tobacco: Never  Vaping Use   Vaping Use: Never used  Substance and Sexual Activity   Alcohol use: No    Alcohol/week: 0.0 standard drinks   Drug use: No   Sexual activity: Not on file  Other Topics Concern   Not on file  Social History Narrative   Not on file   Social Determinants of Health   Financial Resource Strain: Not on file  Food Insecurity: Not on file  Transportation Needs: Not  on file  Physical Activity: Not on file  Stress: Not on file  Social Connections: Not on file  Intimate Partner Violence: Not on file    Outpatient Medications Prior to Visit  Medication Sig Dispense Refill   aspirin 81 MG tablet Take 81 mg by mouth daily.     betamethasone dipropionate 0.05 % cream Apply topically.     escitalopram (LEXAPRO) 5 MG tablet Take 5 mg by mouth daily.     loratadine (CLARITIN) 10 MG tablet Take 10 mg by mouth daily.     pantoprazole (PROTONIX) 40 MG tablet Take 40 mg by mouth daily.     rizatriptan (MAXALT) 10 MG tablet Take 10 mg by mouth as needed for migraine. May repeat in 2 hours if needed     rosuvastatin (CRESTOR) 10 MG tablet TAKE 1 TABLET (10 MG TOTAL) BY MOUTH DAILY. FOLLOW UP NEEDED 90 tablet 0   valACYclovir (VALTREX) 500 MG tablet valacyclovir 500 mg tablet  TAKE 1 TABLET BY  MOUTH TWICE DAILY X3 DAYS AS NEEDED FOR GENITAL LESIONS     albuterol (VENTOLIN HFA) 108 (90 Base) MCG/ACT inhaler Inhale into the lungs.     HYDROcodone-acetaminophen (NORCO) 5-325 MG tablet Take 2 tablets by mouth every 4 (four) hours as needed for moderate pain. 30 tablet 0   No facility-administered medications prior to visit.    Allergies  Allergen Reactions   Lipitor [Atorvastatin] Other (See Comments)    MYALGIA    ROS Review of Systems  Constitutional:  Negative for diaphoresis, fatigue, fever and unexpected weight change.  HENT: Negative.    Eyes:  Negative for photophobia and visual disturbance.  Respiratory:  Positive for cough. Negative for shortness of breath and wheezing.   Cardiovascular: Negative.   Gastrointestinal: Negative.   Endocrine: Negative for polyphagia and polyuria.  Genitourinary:  Negative for difficulty urinating, frequency and urgency.  Musculoskeletal:  Positive for back pain.  Neurological:  Positive for numbness. Negative for speech difficulty, weakness and headaches.     Objective:    Physical Exam Vitals and nursing note reviewed.  Constitutional:      General: She is not in acute distress.    Appearance: Normal appearance. She is not ill-appearing, toxic-appearing or diaphoretic.  HENT:     Head: Normocephalic and atraumatic.     Right Ear: Tympanic membrane, ear canal and external ear normal.     Left Ear: Tympanic membrane, ear canal and external ear normal.     Mouth/Throat:     Mouth: Mucous membranes are moist.     Pharynx: Oropharynx is clear. No oropharyngeal exudate or posterior oropharyngeal erythema.  Eyes:     General: No scleral icterus.       Right eye: No discharge.        Left eye: No discharge.     Extraocular Movements: Extraocular movements intact.     Conjunctiva/sclera: Conjunctivae normal.     Pupils: Pupils are equal, round, and reactive to light.  Cardiovascular:     Rate and Rhythm: Normal rate and regular  rhythm.  Pulmonary:     Effort: Pulmonary effort is normal.     Breath sounds: Normal breath sounds.  Abdominal:     General: Bowel sounds are normal.  Musculoskeletal:     Cervical back: No rigidity or tenderness.     Lumbar back: No spasms, tenderness or bony tenderness. Normal range of motion. Positive left straight leg raise test (pain in lower back with left slr.). Negative  right straight leg raise test.       Back:  Lymphadenopathy:     Cervical: No cervical adenopathy.  Skin:    General: Skin is warm and dry.  Neurological:     Mental Status: She is alert and oriented to person, place, and time.  Psychiatric:        Mood and Affect: Mood normal.        Behavior: Behavior normal.    BP (!) 138/100  Wt Readings from Last 3 Encounters:  10/16/20 224 lb 9.6 oz (101.9 kg)  01/13/20 222 lb (100.7 kg)  10/03/16 218 lb 4 oz (99 kg)     Health Maintenance Due  Topic Date Due   TETANUS/TDAP  Never done   PAP SMEAR-Modifier  Never done   MAMMOGRAM  Never done   Zoster Vaccines- Shingrix (1 of 2) Never done   COVID-19 Vaccine (3 - Booster for Moderna series) 05/25/2020    There are no preventive care reminders to display for this patient.  Lab Results  Component Value Date   TSH 1.10 01/13/2020   Lab Results  Component Value Date   WBC 6.7 01/13/2020   HGB 13.8 01/13/2020   HCT 40.4 01/13/2020   MCV 92.4 01/13/2020   PLT 220.0 01/13/2020   Lab Results  Component Value Date   NA 138 01/13/2020   K 4.1 01/13/2020   CO2 29 01/13/2020   GLUCOSE 102 (H) 01/13/2020   BUN 16 01/13/2020   CREATININE 0.83 01/13/2020   BILITOT 0.8 01/13/2020   ALKPHOS 63 01/13/2020   AST 23 01/13/2020   ALT 5 01/13/2020   PROT 7.0 01/13/2020   ALBUMIN 4.3 01/13/2020   CALCIUM 9.8 01/13/2020   GFR 69.90 01/13/2020   Lab Results  Component Value Date   CHOL 158 01/13/2020   Lab Results  Component Value Date   HDL 67.50 01/13/2020   Lab Results  Component Value Date    LDLCALC 76 01/13/2020   Lab Results  Component Value Date   TRIG 75.0 01/13/2020   Lab Results  Component Value Date   CHOLHDL 2 01/13/2020   No results found for: HGBA1C    Assessment & Plan:   Problem List Items Addressed This Visit   None Visit Diagnoses     Post-viral cough syndrome    -  Primary   Relevant Medications   predniSONE (STERAPRED UNI-PAK 48 TAB) 10 MG (48) TBPK tablet   Other Relevant Orders   DG Chest 2 View   Radiculopathy due to lumbar intervertebral disc disorder       Relevant Medications   predniSONE (STERAPRED UNI-PAK 48 TAB) 10 MG (48) TBPK tablet   methocarbamol (ROBAXIN) 500 MG tablet   Other Relevant Orders   DG Lumbar Spine Complete   Elevated BP without diagnosis of hypertension           Meds ordered this encounter  Medications   predniSONE (STERAPRED UNI-PAK 48 TAB) 10 MG (48) TBPK tablet    Sig: Please instruct a 12 day dose pack    Dispense:  48 tablet    Refill:  0    Please instruct a 12 day dose pack.   methocarbamol (ROBAXIN) 500 MG tablet    Sig: Take 1 tablet (500 mg total) by mouth every 8 (eight) hours as needed for muscle spasms.    Dispense:  60 tablet    Refill:  0    Follow-up: Return in about 3 months (around 10/10/2021).  Information was given on degenerative disc disease.  We discussed the side effects of prednisone.  Will be used post for her back and her postviral cough.  Information was given on preventing hypertension.  She will do her best to avoid salt.  Will check and record her blood pressures a few times after she finishes the prednisone.  Mliss Sax, MD

## 2021-07-18 ENCOUNTER — Telehealth: Payer: Self-pay | Admitting: Family Medicine

## 2021-07-19 ENCOUNTER — Ambulatory Visit (INDEPENDENT_AMBULATORY_CARE_PROVIDER_SITE_OTHER): Payer: 59 | Admitting: Nurse Practitioner

## 2021-07-19 ENCOUNTER — Other Ambulatory Visit: Payer: Self-pay

## 2021-07-19 VITALS — BP 138/86 | HR 72 | Temp 97.5°F | Resp 16

## 2021-07-19 DIAGNOSIS — M5440 Lumbago with sciatica, unspecified side: Secondary | ICD-10-CM | POA: Diagnosis not present

## 2021-07-19 MED ORDER — HYDROCODONE-ACETAMINOPHEN 5-325 MG PO TABS: 1.0000 | ORAL_TABLET | Freq: Three times a day (TID) | ORAL | 0 refills | Status: AC | PRN

## 2021-07-19 NOTE — Patient Instructions (Signed)
Nice to see you today Doreatha Martin out the prednisone that was prescribed by Dr. Doreene Burke Stop taking the Robaxin (methocarbamol). I wrote some additional pain medication. You can have it every 8 hours IF YOU NEED it. If you do not need it you do not need to take it. Follow up with [facility] and Dr. Lovell Sheehan

## 2021-07-19 NOTE — Progress Notes (Signed)
Acute Office Visit  Subjective:    Patient ID: Natalie [name], female    DOB: 02-22-1961, 60 y.o.   MRN: [id number]  Chief Complaint  Patient presents with   Back Pain    X 2 weeks. Saw PCP last week and started Prednisone and Robaxin-still taking both medications. Pain is located left lower back and left buttocks/hip area, and then left ankle hurts. Patient had Norco on hand from kidney stone in March and this has helped some to be able to sleep but does not take away the pain. [facility] is in the process of setting up MRI for patient.     Patient is in today for Back pain  Saw on 07/12/2021 for back  Prednisone and robaxin Still taking the prednisone and states robaxin not helping at all. [facility] 07/16/2021 appt and is scheduled for right knee replacement on 08/13/2021. States that they are working on M[facility] Dr. Arnoldo Morale neurosurgery on Tuesday pneidng MRI  Hydroconde left over that did help some  Sharp stabbing pain that is intermittent Sitting on her bottom makes it worse. Laying supine   Past Medical History:  Diagnosis Date   Abnormally small mouth    Arthritis    knees   Carpal tunnel syndrome on right 09/2016   Dental crowns present    also dental caps   Family history of adverse reaction to anesthesia    pt's mother and son have hx. of post-op N/V   GERD (gastroesophageal reflux disease)    Hx of migraines    Hyperlipidemia    IBS (irritable bowel syndrome)    PONV (postoperative nausea and vomiting)    1 episode only    Past Surgical History:  Procedure Laterality Date   CARPAL TUNNEL RELEASE Right 10/03/2016   Procedure: Right limited open CARPAL TUNNEL RELEASE;  Surgeon: Roseanne Kaufman, MD;  Location: [facility];  Service: Orthopedics;  Laterality: Right;   CESAREAN SECTION     CHOLECYSTECTOMY  05/27/2016   COLONOSCOPY WITH PROPOFOL  04/20/2015   CYSTO     INCISION / DEBRIDEMENT BONE ELBOW Right 06/14/2003   radial head   KNEE  ARTHROSCOPY Left 01/22/2010   SALPINGOOPHORECTOMY Left 03/31/2002   STERIOD INJECTION Left 10/03/2016   Procedure: STEROID INJECTION;  Surgeon: Roseanne Kaufman, MD;  Location: [facility];  Service: Orthopedics;  Laterality: Left;   TOTAL ABDOMINAL HYSTERECTOMY  03/31/2002    Family History  Problem Relation Age of Onset   Anesthesia problems Mother        post-op N/V   Anesthesia problems Other        post-op N/V    Social History   Socioeconomic History   Marital status: Married    Spouse name: Not on file   Number of children: Not on file   Years of education: Not on file   Highest education level: Not on file  Occupational History   Not on file  Tobacco Use   Smoking status: Never   Smokeless tobacco: Never  Vaping Use   Vaping Use: Never used  Substance and Sexual Activity   Alcohol use: No    Alcohol/week: 0.0 standard drinks   Drug use: No   Sexual activity: Not on file  Other Topics Concern   Not on file  Social History Narrative   Not on file   Social Determinants of Health   Financial Resource Strain: Not on file  Food Insecurity: Not on file  Transportation  Needs: Not on file  Physical Activity: Not on file  Stress: Not on file  Social Connections: Not on file  Intimate Partner Violence: Not on file    Outpatient Medications Prior to Visit  Medication Sig Dispense Refill   aspirin 81 MG tablet Take 81 mg by mouth daily.     betamethasone dipropionate 0.05 % cream Apply topically.     escitalopram (LEXAPRO) 5 MG tablet Take 5 mg by mouth daily.     loratadine (CLARITIN) 10 MG tablet Take 10 mg by mouth daily.     methocarbamol (ROBAXIN) 500 MG tablet Take 1 tablet (500 mg total) by mouth every 8 (eight) hours as needed for muscle spasms. 60 tablet 0   pantoprazole (PROTONIX) 40 MG tablet Take 40 mg by mouth daily.     predniSONE (STERAPRED UNI-PAK 48 TAB) 10 MG (48) TBPK tablet Please instruct a 12 day dose pack 48 tablet 0    rizatriptan (MAXALT) 10 MG tablet Take 10 mg by mouth as needed for migraine. May repeat in 2 hours if needed     rosuvastatin (CRESTOR) 10 MG tablet TAKE 1 TABLET (10 MG TOTAL) BY MOUTH DAILY. FOLLOW UP NEEDED 90 tablet 0   valACYclovir (VALTREX) 500 MG tablet valacyclovir 500 mg tablet  TAKE 1 TABLET BY MOUTH TWICE DAILY X3 DAYS AS NEEDED FOR GENITAL LESIONS     No facility-administered medications prior to visit.    Allergies  Allergen Reactions   Lipitor [Atorvastatin] Other (See Comments)    MYALGIA    Review of Systems  Constitutional:  Negative for chills and fatigue.  Gastrointestinal:  Negative for abdominal pain, blood in stool, constipation, diarrhea, nausea and vomiting.  Genitourinary:  Negative for dysuria and hematuria.  Musculoskeletal:  Positive for back pain.  Neurological:  Positive for numbness (left space between great and second toe).      Objective:    Physical Exam Vitals and nursing note reviewed.  Constitutional:      Appearance: She is obese.  Cardiovascular:     Rate and Rhythm: Normal rate and regular rhythm.  Pulmonary:     Effort: Pulmonary effort is normal.     Breath sounds: Wheezing present.  Abdominal:     General: Bowel sounds are normal. There is no distension.     Palpations: There is no mass.     Tenderness: There is no abdominal tenderness.  Musculoskeletal:        General: Tenderness present. No swelling, deformity or signs of injury.     Lumbar back: Tenderness and bony tenderness present. No swelling, deformity or signs of trauma. Positive right straight leg raise test. Negative left straight leg raise test.       Back:     Right lower leg: No edema.     Left lower leg: No edema.  Skin:    General: Skin is warm.  Neurological:     Mental Status: She is alert.     Sensory: Sensory deficit present.     Deep Tendon Reflexes:     Reflex Scores:      Patellar reflexes are 2+ on the right side and 2+ on the left side.     Comments: Lower extremity strength 5/5.  Psychiatric:        Mood and Affect: Mood normal.        Behavior: Behavior normal.        Thought Content: Thought content normal.        Judgment:  Judgment normal.    BP 138/86    Pulse 72    Temp (!) 97.5 F (36.4 C)    Resp 16    SpO2 95%  Wt Readings from Last 3 Encounters:  07/12/21 210 lb 9.6 oz (95.5 kg)  10/16/20 224 lb 9.6 oz (101.9 kg)  01/13/20 222 lb (100.7 kg)    Health Maintenance Due  Topic Date Due   TETANUS/TDAP  Never done   PAP SMEAR-Modifier  Never done   MAMMOGRAM  Never done   Zoster Vaccines- Shingrix (1 of 2) Never done   COVID-19 Vaccine (3 - Booster for Moderna series) 05/25/2020    There are no preventive care reminders to display for this patient.   Lab Results  Component Value Date   TSH 1.10 01/13/2020   Lab Results  Component Value Date   WBC 6.7 01/13/2020   HGB 13.8 01/13/2020   HCT 40.4 01/13/2020   MCV 92.4 01/13/2020   PLT 220.0 01/13/2020   Lab Results  Component Value Date   NA 138 01/13/2020   K 4.1 01/13/2020   CO2 29 01/13/2020   GLUCOSE 102 (H) 01/13/2020   BUN 16 01/13/2020   CREATININE 0.83 01/13/2020   BILITOT 0.8 01/13/2020   ALKPHOS 63 01/13/2020   AST 23 01/13/2020   ALT 5 01/13/2020   PROT 7.0 01/13/2020   ALBUMIN 4.3 01/13/2020   CALCIUM 9.8 01/13/2020   GFR 69.90 01/13/2020   Lab Results  Component Value Date   CHOL 158 01/13/2020   Lab Results  Component Value Date   HDL 67.50 01/13/2020   Lab Results  Component Value Date   LDLCALC 76 01/13/2020   Lab Results  Component Value Date   TRIG 75.0 01/13/2020   Lab Results  Component Value Date   CHOLHDL 2 01/13/2020   No results found for: HGBA1C     Assessment & Plan:   Problem List Items Addressed This Visit       Nervous and Auditory   Back pain of lumbar region with sciatica - Primary    Patient has been evaluated and plain film done.  Patient is happy and comfortable in office has  already been seen by Ortho and they are working on getting an MRI of her back.  Did discuss with patient to finish steroid pack as prescribed by PCP states Robaxin not having much told her she may discontinue the medication we will send a short course of Norco until she can get in with a neurosurgeon on Tuesday Dr. Arnoldo Morale.  Sedation precautions reviewed.  [place] PDMP also reviewed.      Relevant Medications   HYDROcodone-acetaminophen (NORCO/VICODIN) 5-325 MG tablet     No orders of the defined types were placed in this encounter.  This visit occurred during the SARS-CoV-2 public health emergency.  Safety protocols were in place, including screening questions prior to the visit, additional usage of staff PPE, and extensive cleaning of exam room while observing appropriate contact time as indicated for disinfecting solutions.   Romilda Garret, NP

## 2021-07-19 NOTE — Assessment & Plan Note (Signed)
Patient has been evaluated and plain film done.  Patient is happy and comfortable in office has already been seen by Ortho and they are working on getting an MRI of her back.  Did discuss with patient to finish steroid pack as prescribed by PCP states Robaxin not having much told her she may discontinue the medication we will send a short course of Norco until she can get in with a neurosurgeon on Tuesday Dr. Lovell Sheehan.  Sedation precautions reviewed.  [place] PDMP also reviewed.

## 2021-08-01 ENCOUNTER — Telehealth: Payer: Self-pay | Admitting: Family Medicine

## 2021-08-01 DIAGNOSIS — M5440 Lumbago with sciatica, unspecified side: Secondary | ICD-10-CM

## 2021-08-01 MED ORDER — PREGABALIN 50 MG PO CAPS: 50.0000 mg | ORAL_CAPSULE | Freq: Three times a day (TID) | ORAL | 1 refills | Status: DC

## 2021-08-01 NOTE — Telephone Encounter (Signed)
Per patient neuro had put her on Gabapentin and ordered MRI. Gabapentin not helping much and MRI denied.

## 2021-08-01 NOTE — Addendum Note (Signed)
Addended by: Andrez Grime on: 08/01/2021 04:19 PM   Modules accepted: Orders

## 2021-08-01 NOTE — Telephone Encounter (Signed)
Patient aware and will pick up Rx from today. Patient also agrees to stop taking Gabapentin.

## 2021-08-01 NOTE — Telephone Encounter (Signed)
Patient calling states that she is continuing to have bad back pains per patient she have seen several doctor who have ordered MRI but they were all denied. Patient was in the office on 07/19/21 and given a Rx for Hydrocodone only 9 pills patient states that this helps her sleep would like a refill on this. She has an upcoming appointment on 08/06/20 but would like something before she comes in. Please advise.

## 2021-08-01 NOTE — Telephone Encounter (Signed)
Pt is wanting a call back concerning her back pain. She would like to speak with you about some options for this issue. Please advise at [phone].

## 2021-08-05 ENCOUNTER — Other Ambulatory Visit: Payer: Self-pay

## 2021-08-06 ENCOUNTER — Ambulatory Visit (INDEPENDENT_AMBULATORY_CARE_PROVIDER_SITE_OTHER): Payer: 59 | Admitting: Family Medicine

## 2021-08-06 ENCOUNTER — Encounter: Payer: Self-pay | Admitting: Family Medicine

## 2021-08-06 VITALS — BP 158/94 | HR 87 | Temp 97.5°F | Ht 62.0 in | Wt 203.4 lb

## 2021-08-06 DIAGNOSIS — M5440 Lumbago with sciatica, unspecified side: Secondary | ICD-10-CM

## 2021-08-06 DIAGNOSIS — M5116 Intervertebral disc disorders with radiculopathy, lumbar region: Secondary | ICD-10-CM

## 2021-08-06 MED ORDER — KETOROLAC TROMETHAMINE 60 MG/2ML IM SOLN
60.0000 mg | Freq: Once | INTRAMUSCULAR | Status: AC
  Administered 2021-08-06: 60 mg via INTRAMUSCULAR

## 2021-08-06 MED ORDER — GABAPENTIN 300 MG PO CAPS: 600.0000 mg | ORAL_CAPSULE | Freq: Three times a day (TID) | ORAL | 1 refills | Status: DC

## 2021-08-06 MED ORDER — HYDROCODONE-ACETAMINOPHEN 5-325 MG PO TABS: ORAL_TABLET | ORAL | 0 refills | Status: DC

## 2021-08-06 MED ORDER — KETOROLAC TROMETHAMINE 10 MG PO TABS: 10.0000 mg | ORAL_TABLET | Freq: Four times a day (QID) | ORAL | 0 refills | Status: DC | PRN

## 2021-08-06 NOTE — Progress Notes (Addendum)
Established Patient Office Visit  Subjective:  Patient ID: Natalie [name], female    DOB: 17-Nov-1960  Age: 61 y.o. MRN: [id number]  CC:  Chief Complaint  Patient presents with   Follow-up    Follow up on back pains would like refill on Hydrocodone still have major back pains.     HPI Natalie [name] presents for evaluation of a 4-week history of worsening back pain.  Pain is in the lower back.  There is radiation of pain from her buttock down the back of the leg and also from her lateral ankle up into the calf.  It is difficult for her to get comfortable.  There is pain with sitting and prolonged standing.  She is most comfortable when lying on her right side.  She has had paresthesias in her toes that have come and gone.  Currently taking Neurontin 300 3 times daily.  She did not fill the Lyrica.  Neurontin seems to be helping some.  Norco seems to help and she is requesting a refill.  Total knee replacement is scheduled for the 17th.  She is planning on canceling surgery.  Past Medical History:  Diagnosis Date   Abnormally small mouth    Arthritis    knees   Carpal tunnel syndrome on right 09/2016   Dental crowns present    also dental caps   Family history of adverse reaction to anesthesia    pt's mother and son have hx. of post-op N/V   GERD (gastroesophageal reflux disease)    Hx of migraines    Hyperlipidemia    IBS (irritable bowel syndrome)    PONV (postoperative nausea and vomiting)    1 episode only    Past Surgical History:  Procedure Laterality Date   CARPAL TUNNEL RELEASE Right 10/03/2016   Procedure: Right limited open CARPAL TUNNEL RELEASE;  Surgeon: Roseanne Kaufman, MD;  Location: [place];  Service: Orthopedics;  Laterality: Right;   CESAREAN SECTION     CHOLECYSTECTOMY  05/27/2016   COLONOSCOPY WITH PROPOFOL  04/20/2015   CYSTO     INCISION / DEBRIDEMENT BONE ELBOW Right 06/14/2003   radial head   KNEE ARTHROSCOPY Left 01/22/2010    SALPINGOOPHORECTOMY Left 03/31/2002   STERIOD INJECTION Left 10/03/2016   Procedure: STEROID INJECTION;  Surgeon: Roseanne Kaufman, MD;  Location: [place];  Service: Orthopedics;  Laterality: Left;   TOTAL ABDOMINAL HYSTERECTOMY  03/31/2002    Family History  Problem Relation Age of Onset   Anesthesia problems Mother        post-op N/V   Anesthesia problems Other        post-op N/V    Social History   Socioeconomic History   Marital status: Married    Spouse name: Not on file   Number of children: Not on file   Years of education: Not on file   Highest education level: Not on file  Occupational History   Not on file  Tobacco Use   Smoking status: Never   Smokeless tobacco: Never  Vaping Use   Vaping Use: Never used  Substance and Sexual Activity   Alcohol use: No    Alcohol/week: 0.0 standard drinks   Drug use: No   Sexual activity: Not on file  Other Topics Concern   Not on file  Social History Narrative   Not on file   Social Determinants of Health   Financial Resource Strain: Not on file  Food Insecurity: Not  on file  Transportation Needs: Not on file  Physical Activity: Not on file  Stress: Not on file  Social Connections: Not on file  Intimate Partner Violence: Not on file    Outpatient Medications Prior to Visit  Medication Sig Dispense Refill   aspirin 81 MG tablet Take 81 mg by mouth daily.     betamethasone dipropionate 0.05 % cream Apply topically.     escitalopram (LEXAPRO) 5 MG tablet Take 5 mg by mouth daily.     loratadine (CLARITIN) 10 MG tablet Take 10 mg by mouth daily.     pantoprazole (PROTONIX) 40 MG tablet Take 40 mg by mouth daily.     rizatriptan (MAXALT) 10 MG tablet Take 10 mg by mouth as needed for migraine. May repeat in 2 hours if needed     rosuvastatin (CRESTOR) 10 MG tablet TAKE 1 TABLET (10 MG TOTAL) BY MOUTH DAILY. FOLLOW UP NEEDED 90 tablet 0   valACYclovir (VALTREX) 500 MG tablet valacyclovir 500 mg tablet   TAKE 1 TABLET BY MOUTH TWICE DAILY X3 DAYS AS NEEDED FOR GENITAL LESIONS     predniSONE (STERAPRED UNI-PAK 48 TAB) 10 MG (48) TBPK tablet Please instruct a 12 day dose pack (Patient not taking: Reported on 08/06/2021) 48 tablet 0   methocarbamol (ROBAXIN) 500 MG tablet Take 1 tablet (500 mg total) by mouth every 8 (eight) hours as needed for muscle spasms. (Patient not taking: Reported on 08/06/2021) 60 tablet 0   pregabalin (LYRICA) 50 MG capsule Take 1 capsule (50 mg total) by mouth 3 (three) times daily. (Patient not taking: Reported on 08/06/2021) 30 capsule 1   No facility-administered medications prior to visit.    Allergies  Allergen Reactions   Lipitor [Atorvastatin] Other (See Comments)    MYALGIA    ROS Review of Systems  Constitutional: Negative.   Respiratory: Negative.    Cardiovascular: Negative.   Gastrointestinal: Negative.   Genitourinary: Negative.   Musculoskeletal:  Positive for back pain and gait problem.  Neurological:  Positive for numbness. Negative for weakness.  Psychiatric/Behavioral: Negative.       Objective:    Physical Exam Vitals and nursing note reviewed.  Constitutional:      General: She is not in acute distress.    Appearance: Normal appearance. She is not ill-appearing or toxic-appearing.  HENT:     Head: Normocephalic and atraumatic.     Right Ear: External ear normal.     Left Ear: External ear normal.  Eyes:     General: No scleral icterus.       Right eye: No discharge.        Left eye: No discharge.     Conjunctiva/sclera: Conjunctivae normal.  Pulmonary:     Effort: Pulmonary effort is normal.  Musculoskeletal:     Lumbar back: No deformity, tenderness or bony tenderness. Decreased range of motion. Positive left straight leg raise test. Negative right straight leg raise test.  Neurological:     Mental Status: She is alert and oriented to person, place, and time.     Motor: No weakness.     Comments: Positive dural tension signs  on the left.  Psychiatric:        Mood and Affect: Mood normal.        Behavior: Behavior normal.    BP (!) 158/94 (BP Location: Right Arm, Patient Position: Sitting, Cuff Size: Large)    Pulse 87    Temp (!) 97.5 F (36.4 C) (Temporal)  Ht 5\' 2"  (1.575 m)    Wt 203 lb 6.4 oz (92.3 kg)    SpO2 97%    BMI 37.20 kg/m  Wt Readings from Last 3 Encounters:  08/06/21 203 lb 6.4 oz (92.3 kg)  07/12/21 210 lb 9.6 oz (95.5 kg)  10/16/20 224 lb 9.6 oz (101.9 kg)     Health Maintenance Due  Topic Date Due   TETANUS/TDAP  Never done   PAP SMEAR-Modifier  Never done   MAMMOGRAM  Never done   Zoster Vaccines- Shingrix (1 of 2) Never done    There are no preventive care reminders to display for this patient.  Lab Results  Component Value Date   TSH 1.10 01/13/2020   Lab Results  Component Value Date   WBC 6.7 01/13/2020   HGB 13.8 01/13/2020   HCT 40.4 01/13/2020   MCV 92.4 01/13/2020   PLT 220.0 01/13/2020   Lab Results  Component Value Date   NA 138 01/13/2020   K 4.1 01/13/2020   CO2 29 01/13/2020   GLUCOSE 102 (H) 01/13/2020   BUN 16 01/13/2020   CREATININE 0.83 01/13/2020   BILITOT 0.8 01/13/2020   ALKPHOS 63 01/13/2020   AST 23 01/13/2020   ALT 5 01/13/2020   PROT 7.0 01/13/2020   ALBUMIN 4.3 01/13/2020   CALCIUM 9.8 01/13/2020   GFR 69.90 01/13/2020   Lab Results  Component Value Date   CHOL 158 01/13/2020   Lab Results  Component Value Date   HDL 67.50 01/13/2020   Lab Results  Component Value Date   LDLCALC 76 01/13/2020   Lab Results  Component Value Date   TRIG 75.0 01/13/2020   Lab Results  Component Value Date   CHOLHDL 2 01/13/2020   No results found for: HGBA1C    Assessment & Plan:   Problem List Items Addressed This Visit       Nervous and Auditory   Back pain of lumbar region with sciatica - Primary   Relevant Medications   HYDROcodone-acetaminophen (NORCO) 5-325 MG tablet   ketorolac (TORADOL) injection 60 mg (Start on  08/06/2021 12:15 PM)   ketorolac (TORADOL) 10 MG tablet   gabapentin (NEURONTIN) 300 MG capsule   Other Relevant Orders   MR Lumbar Spine Wo Contrast   Radiculopathy due to lumbar intervertebral disc disorder   Relevant Medications   HYDROcodone-acetaminophen (NORCO) 5-325 MG tablet   ketorolac (TORADOL) injection 60 mg (Start on 08/06/2021 12:15 PM)   ketorolac (TORADOL) 10 MG tablet   gabapentin (NEURONTIN) 300 MG capsule   Other Relevant Orders   MR Lumbar Spine Wo Contrast    Meds ordered this encounter  Medications   HYDROcodone-acetaminophen (NORCO) 5-325 MG tablet    Sig: May take one nightly prn.    Dispense:  30 tablet    Refill:  0   ketorolac (TORADOL) injection 60 mg   ketorolac (TORADOL) 10 MG tablet    Sig: Take 1 tablet (10 mg total) by mouth every 6 (six) hours as needed.    Dispense:  20 tablet    Refill:  0   gabapentin (NEURONTIN) 300 MG capsule    Sig: Take 2 capsules (600 mg total) by mouth 3 (three) times daily.    Dispense:  180 capsule    Refill:  1    Follow-up: Return in about 4 weeks (around 09/03/2021).  Most of her pain seems to be radicular.  Have increased the Neurontin to 600 mg 3 times daily.  Norco to be used in the evening.  Have added ketorolac as needed.  Peer-to-peer review for urgent MRI if needed.  Libby Maw, MD

## 2021-08-12 ENCOUNTER — Telehealth: Payer: Self-pay | Admitting: Family Medicine

## 2021-08-12 NOTE — Telephone Encounter (Signed)
FYI message below.

## 2021-08-18 ENCOUNTER — Other Ambulatory Visit: Payer: Self-pay

## 2021-08-18 ENCOUNTER — Ambulatory Visit
Admission: RE | Admit: 2021-08-18 | Discharge: 2021-08-18 | Disposition: A | Discharge status: Home / Self Care | Payer: No Typology Code available for payment source | Source: Ambulatory Visit | Attending: Family Medicine | Admitting: Family Medicine

## 2021-08-18 DIAGNOSIS — M5116 Intervertebral disc disorders with radiculopathy, lumbar region: Secondary | ICD-10-CM

## 2021-08-18 DIAGNOSIS — M5440 Lumbago with sciatica, unspecified side: Secondary | ICD-10-CM

## 2021-08-18 IMAGING — MR MR LUMBAR SPINE W/O CM
4 of 5 series · 26 of 48 positions shown · non-contrast
Comparison: Prior radiograph from [DATE].

CLINICAL DATA: Initial evaluation for low back pain with left leg
pain since [DATE].

EXAM:
MRI LUMBAR SPINE WITHOUT CONTRAST
TECHNIQUE: Multiplanar, multisequence MR imaging of the lumbar spine was
performed. No intravenous contrast was administered.

[Series 2: T2 · sagittal · 4.0mm · 1.09mm/px · 6 of 17 slices shown (1 of 2)]
[im 1/17]
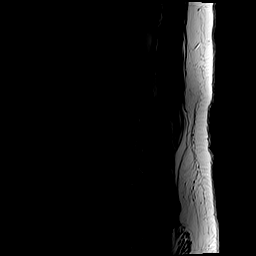
[im 4/17]
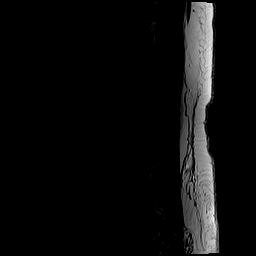
[im 7/17]
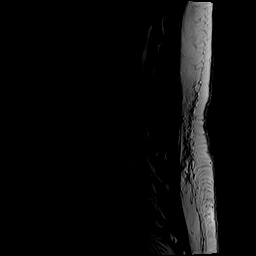
[im 10/17]
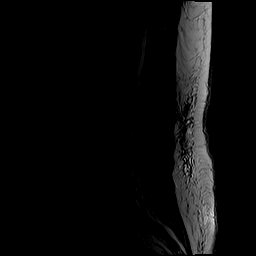
[im 13/17]
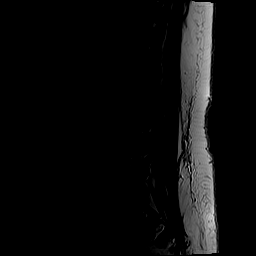
[im 17/17]
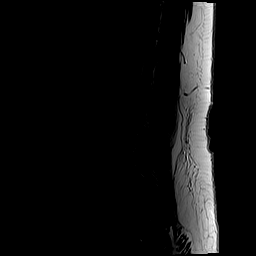

[Series 4: T1 · sagittal · 4.0mm · 1.09mm/px · 6 of 17 slices shown (1 of 2)]
[im 1/17]
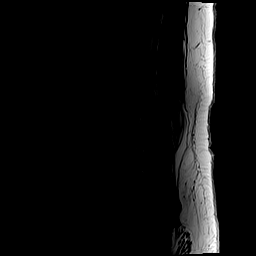
[im 4/17]
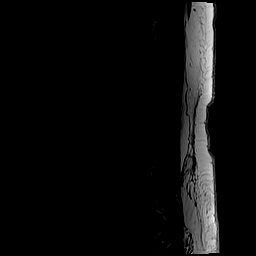
[im 7/17]
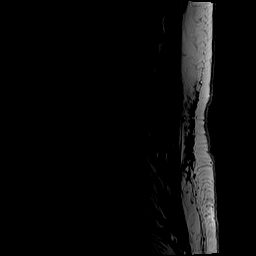
[im 10/17]
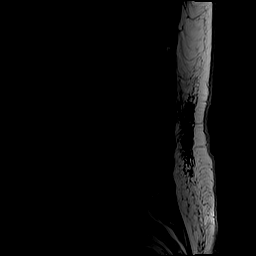
[im 13/17]
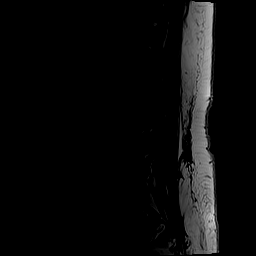
[im 17/17]
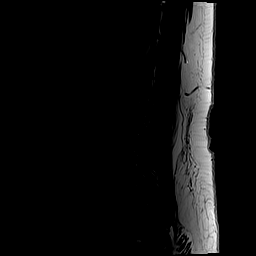

[Series 5: T2 · axial · 4.0mm · 0.39mm/px · z∈[-72,+127]mm · 9 of 41 slices shown (2 of 2)]
[im 1/41]
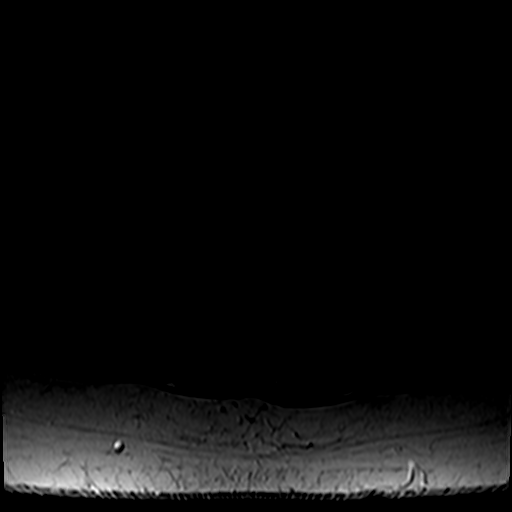
[im 6/41]
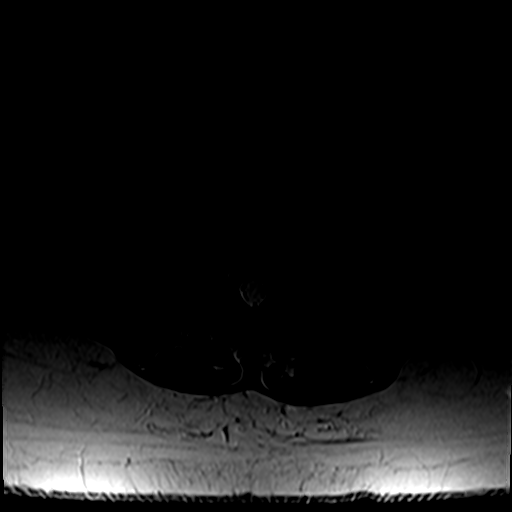
[im 12/41]
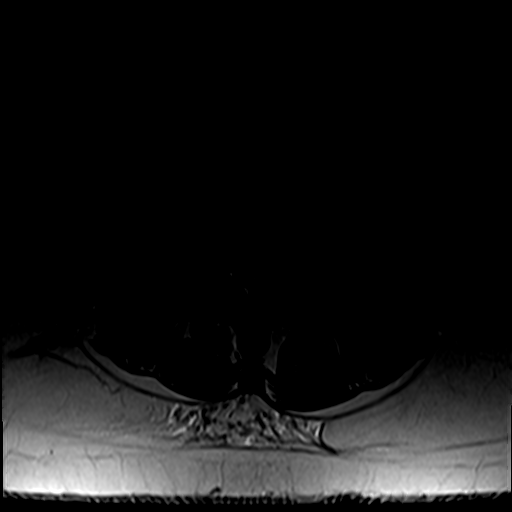
[im 18/41]
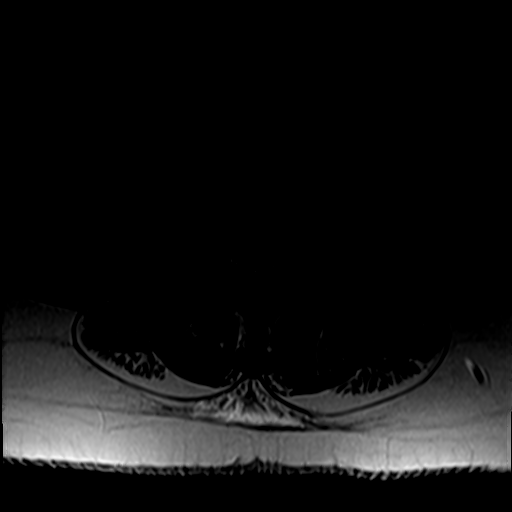
[im 21/41]
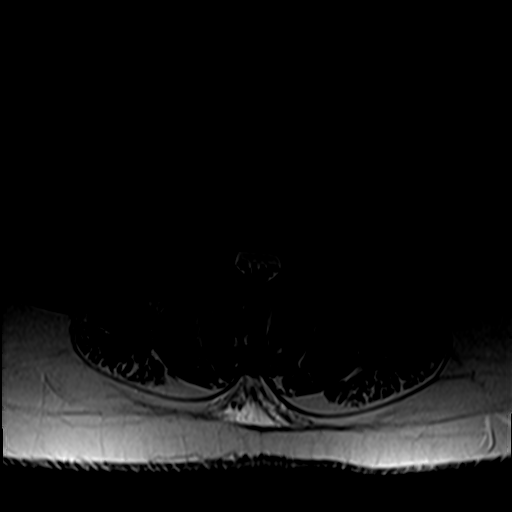
[im 23/41]
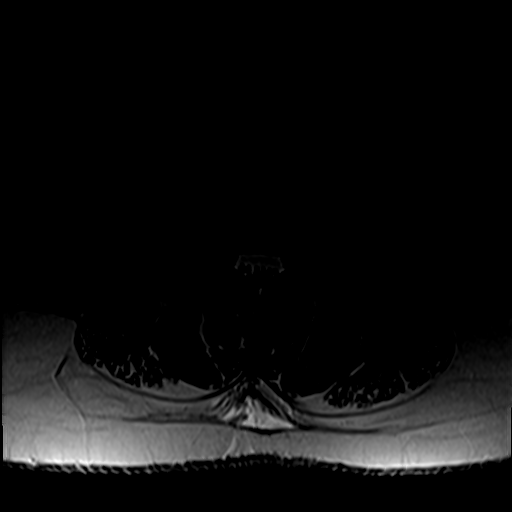
[im 29/41]
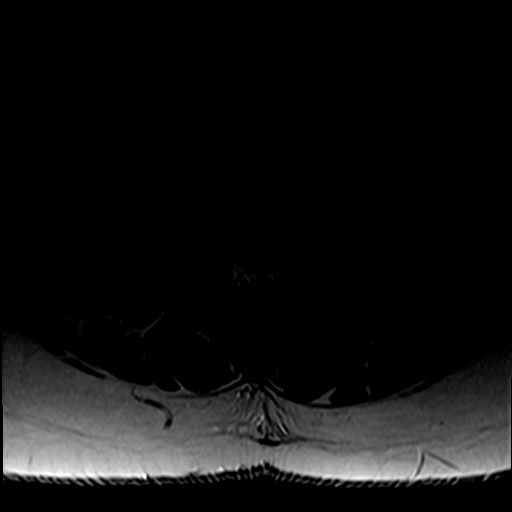
[im 35/41]
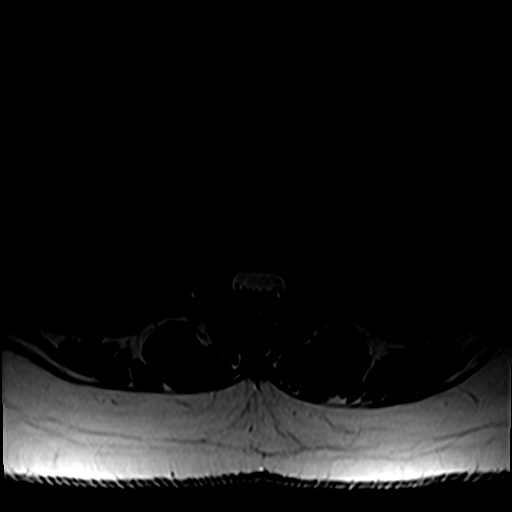
[im 41/41]
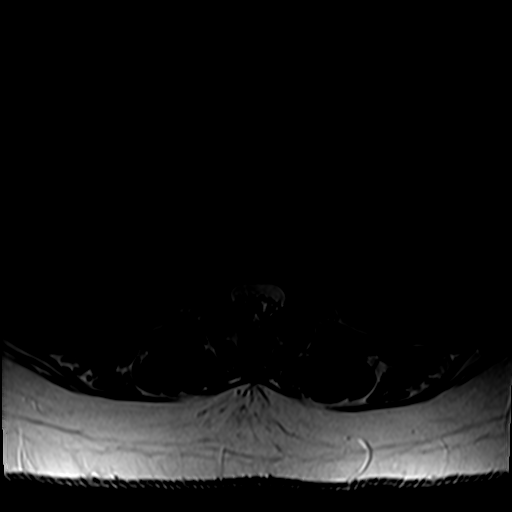

[Series 6: T1 · axial · 4.0mm · 0.39mm/px · z∈[-72,+98]mm · 5 of 41 slices shown (2 of 2)]
[im 1/41]
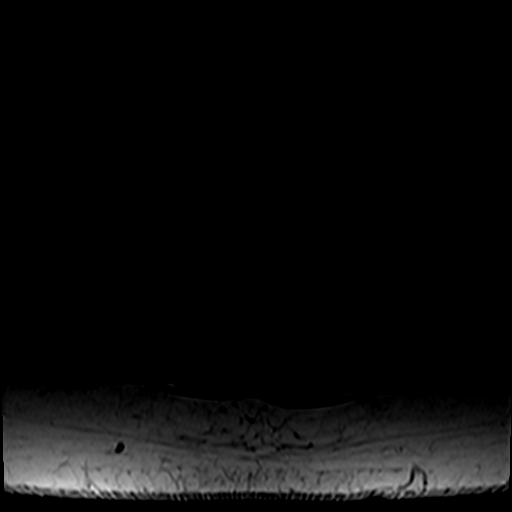
[im 6/41]
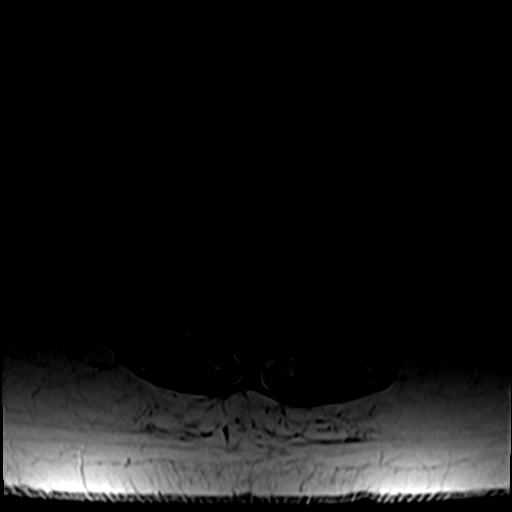
[im 12/41]
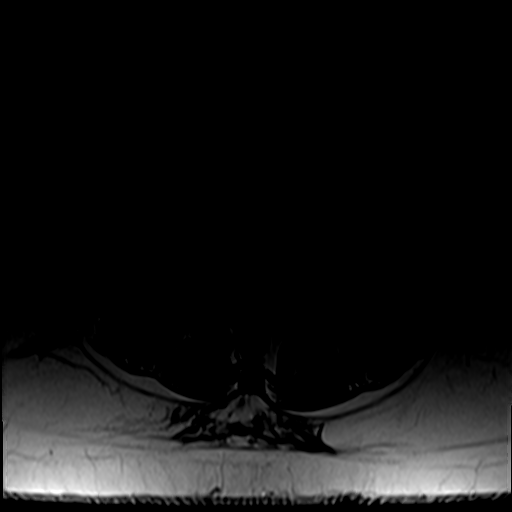
[im 21/41]
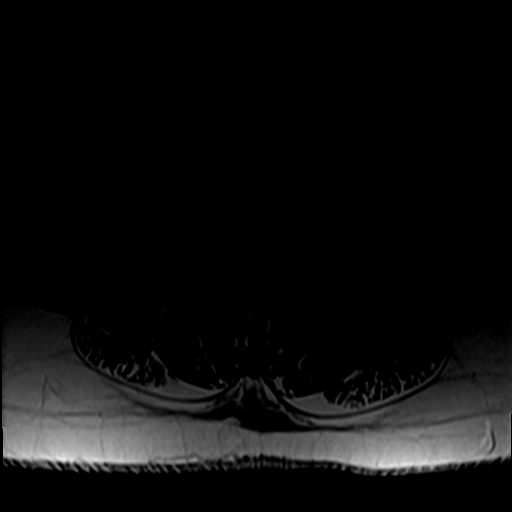
[im 35/41]
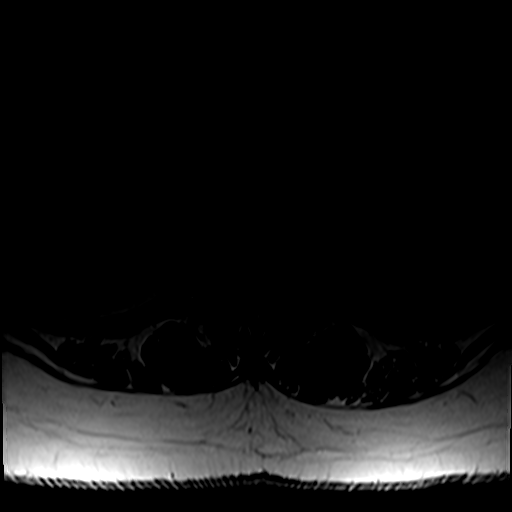

[26 of 48 positions shown; findings below may reference images not displayed]

FINDINGS: Segmentation: Standard. Lowest well-formed disc space labeled the
L5-S1 level.

Alignment: Trace 2 mm anterolisthesis of L3 on L4, chronic and facet
mediated. Mild straightening of the normal lumbar lordosis
elsewhere.

Vertebrae: Vertebral body height maintained without acute or chronic
fracture. Bone marrow signal intensity within normal limits. Benign
hemangioma noted within the L2 vertebral body. No worrisome osseous
lesions. Discogenic reactive with change present about the L3-4 and
L4-5 interspaces. No other abnormal marrow edema.

Conus medullaris and cauda equina: Conus extends to the T12 level.
Conus and cauda equina appear normal.

Paraspinal and other soft tissues: Paraspinous soft tissues within
normal limits. Subcentimeter simple cyst noted at the upper pole of
the right kidney. Visualized visceral structures otherwise
unremarkable.

Disc levels:

L1-2:  Unremarkable.

L2-3: Normal interspace. Mild facet hypertrophy. No canal or
foraminal stenosis.

L3-4: Trace anterolisthesis with advanced degenerative
intervertebral disc space narrowing. Diffuse disc bulge with disc
desiccation and reactive endplate spurring. Mild to moderate facet
and ligament flavum hypertrophy. Resultant moderate spinal stenosis,
with mild to moderate bilateral L3 foraminal narrowing. Superimposed
right foraminal to extraforaminal disc protrusion contacts the
exiting right L3 nerve root as it courses of the right neural
foramen (series 4, image 5).

L4-5: Mild degenerative intervertebral disc space narrowing with
diffuse disc bulge and disc desiccation. Superimposed left
subarticular disc extrusion with inferior migration (series 2, image
11). Disc material impinges upon the descending left L5 nerve root
in the left lateral recess. Superimposed mild facet and ligament
flavum hypertrophy. Resultant mild canal with severe left lateral
recess stenosis. Mild left greater than right L4 foraminal
narrowing.

L5-S1: Negative interspace. Minimal facet hypertrophy. No stenosis
or impingement.
IMPRESSION: 1. Left subarticular disc extrusion with inferior migration at L4-5,
impinging upon the descending left L5 nerve root in the left lateral
recess.
2. Degenerative spondylosis and facet hypertrophy at L3-4 with
resultant moderate spinal stenosis, with mild to moderate bilateral
L3 foraminal narrowing. Superimposed right foraminal to
extraforaminal disc protrusion at this level could potentially
affect the exiting right L3 nerve root.

## 2021-09-03 ENCOUNTER — Ambulatory Visit: Payer: 59 | Admitting: Family Medicine

## 2021-10-07 ENCOUNTER — Other Ambulatory Visit: Payer: Self-pay | Admitting: Family Medicine

## 2021-10-07 DIAGNOSIS — M5440 Lumbago with sciatica, unspecified side: Secondary | ICD-10-CM

## 2021-10-07 DIAGNOSIS — M5116 Intervertebral disc disorders with radiculopathy, lumbar region: Secondary | ICD-10-CM

## 2021-11-21 ENCOUNTER — Other Ambulatory Visit: Payer: Self-pay | Admitting: Student

## 2021-11-21 DIAGNOSIS — M5416 Radiculopathy, lumbar region: Secondary | ICD-10-CM

## 2021-11-28 ENCOUNTER — Ambulatory Visit
Admission: RE | Admit: 2021-11-28 | Discharge: 2021-11-28 | Disposition: A | Discharge status: Home / Self Care | Payer: 59 | Source: Ambulatory Visit | Attending: Student | Admitting: Student

## 2021-11-28 DIAGNOSIS — M5416 Radiculopathy, lumbar region: Secondary | ICD-10-CM

## 2021-11-28 IMAGING — MR MR LUMBAR SPINE WO/W CM
4 of 7 series · 20 of 48 positions shown · IV contrast (19 mL Multihance)
Comparison: MRI of the lumbar spine [DATE].

CLINICAL DATA: Lumbar radiculopathy [XY] ([XY]-CM).

EXAM:
MRI LUMBAR SPINE WITHOUT AND WITH CONTRAST
TECHNIQUE: Multiplanar and multiecho pulse sequences of the lumbar spine were
obtained without and with intravenous contrast.
CONTRAST:  19mL MULTIHANCE GADOBENATE DIMEGLUMINE 529 MG/ML IV SOLN

[Series 6: T1 · sagittal · 4.0mm · 0.73mm/px · 5 of 15 slices shown (1 of 2)]
[im 1/15]
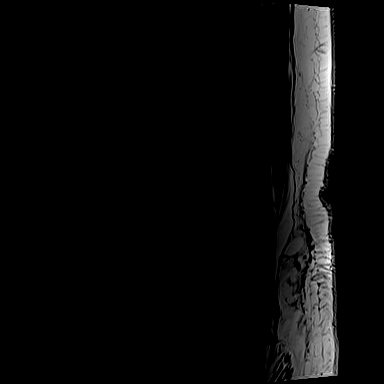
[im 4/15]
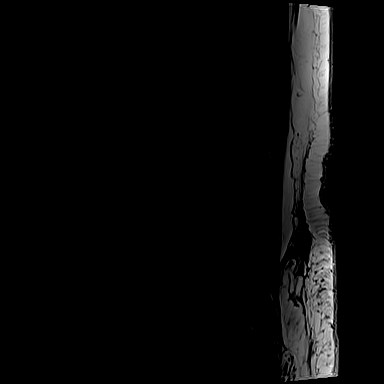
[im 8/15]
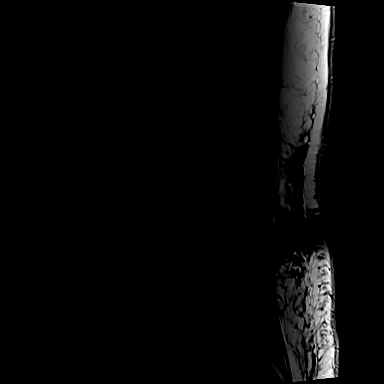
[im 11/15]
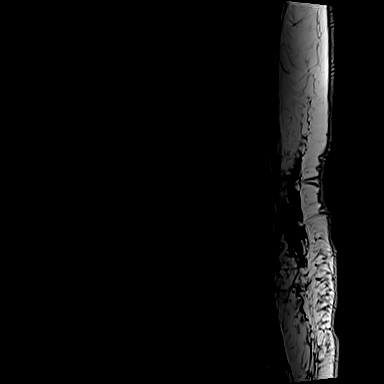
[im 15/15]
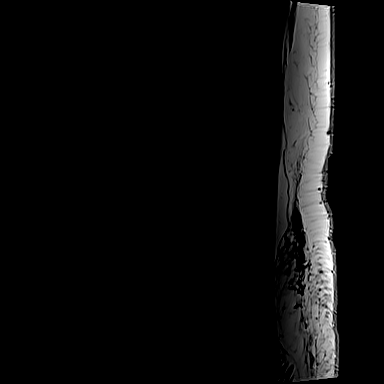

[Series 10: T1 · axial · 4.0mm · 0.28mm/px · z∈[+22,+168]mm · 3 of 37 slices shown (2 of 2)]
[im 5/37]
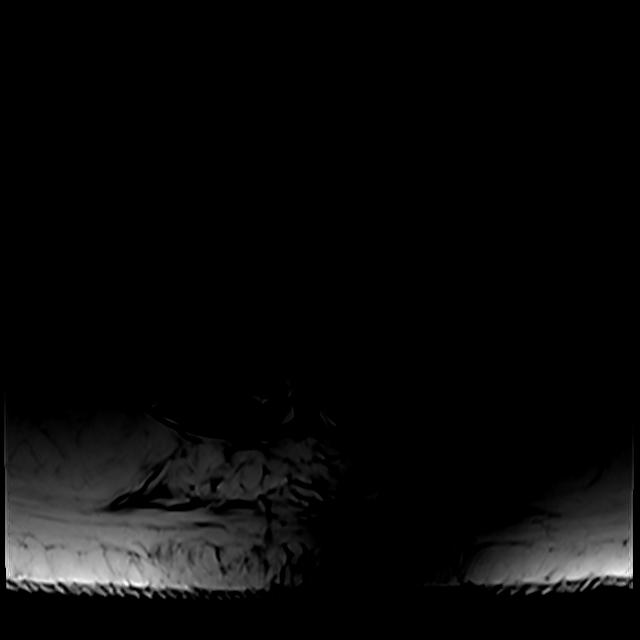
[im 21/37]
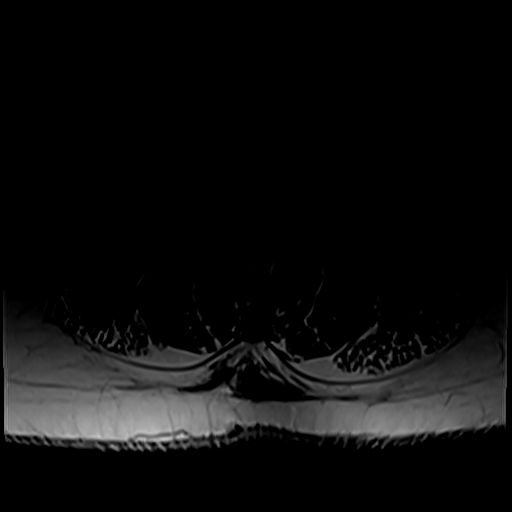
[im 33/37]
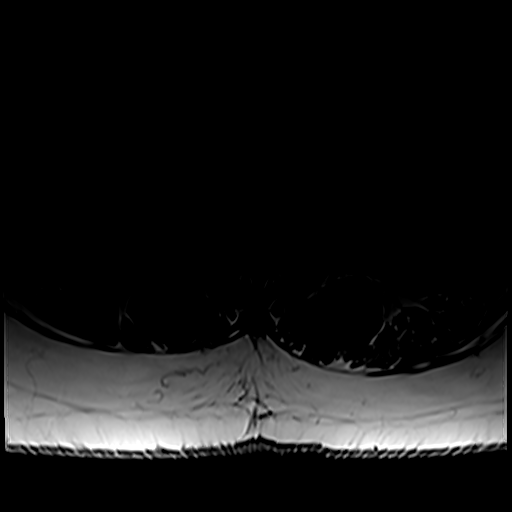

[Series 13: T2 · axial · 4.0mm · 0.28mm/px · z∈[+3,+188]mm · 8 of 37 slices shown (1 of 2)]
[im 1/37]
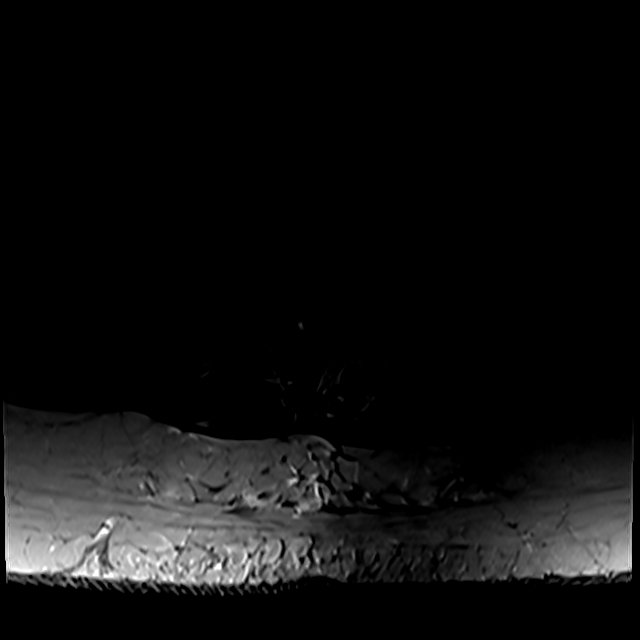
[im 5/37]
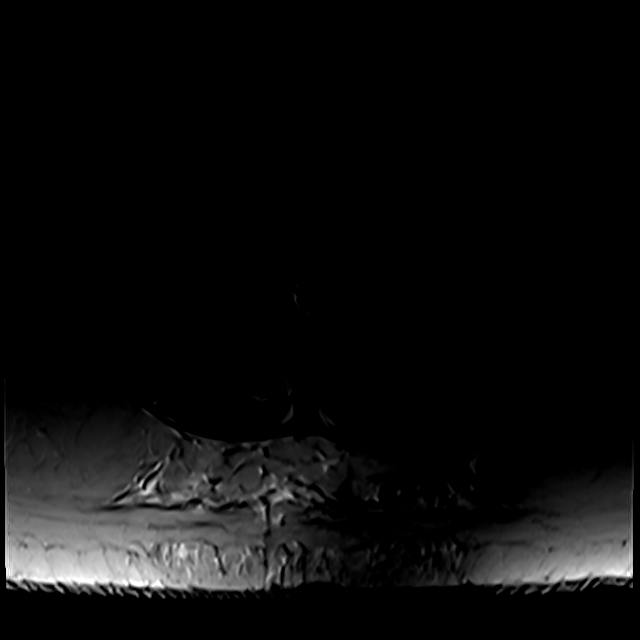
[im 13/37]
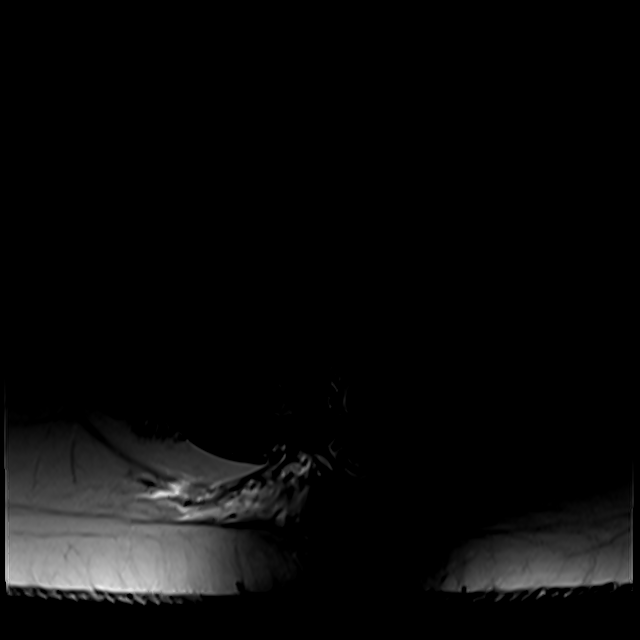
[im 17/37]
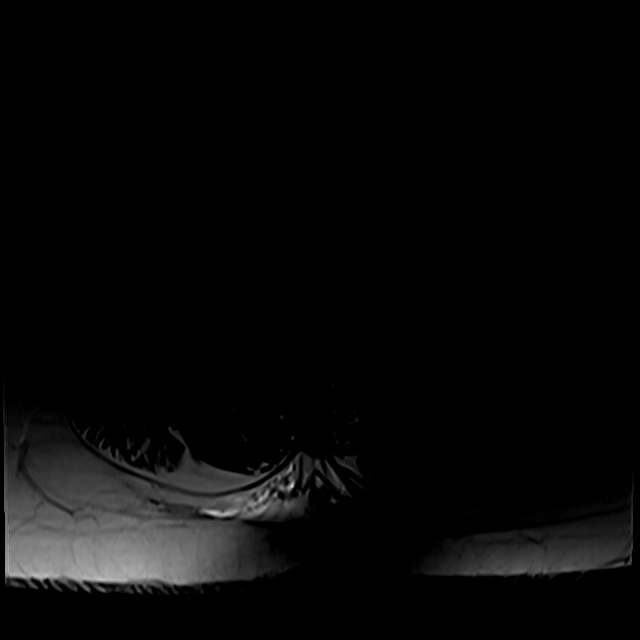
[im 21/37]
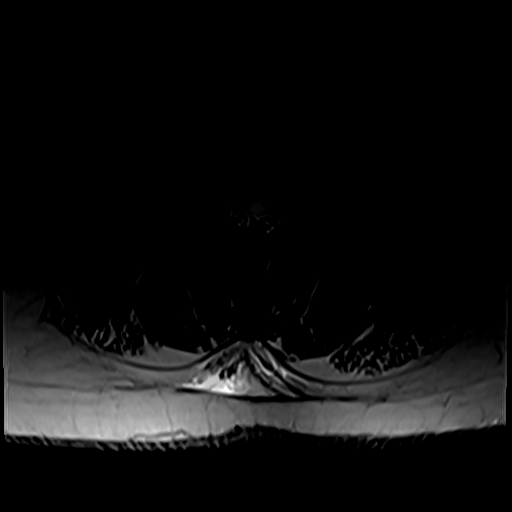
[im 25/37]
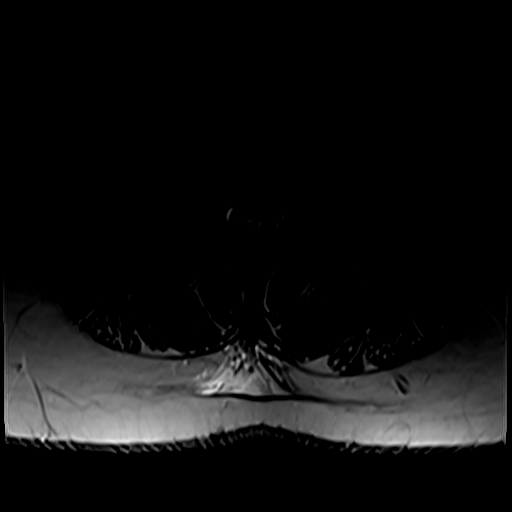
[im 33/37]
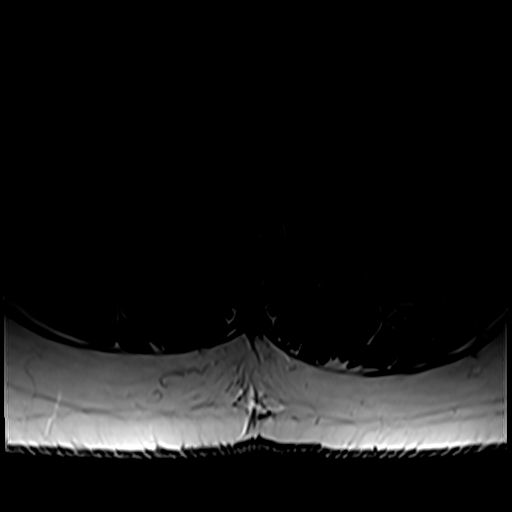
[im 37/37]
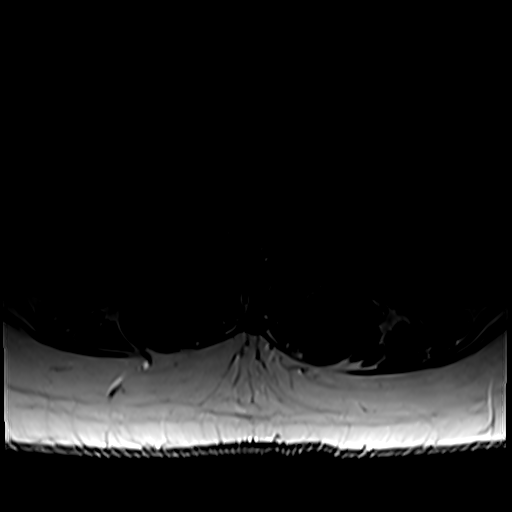

[Series 14: T2 · sagittal · 4.0mm · 0.73mm/px · 4 of 15 slices shown (2 of 2)]
[im 1/15]
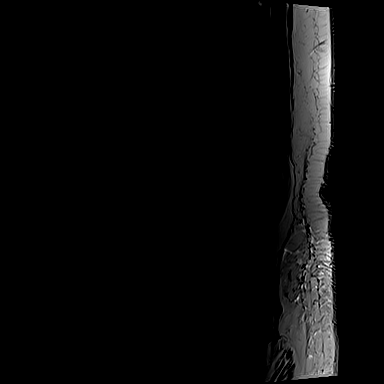
[im 5/15]
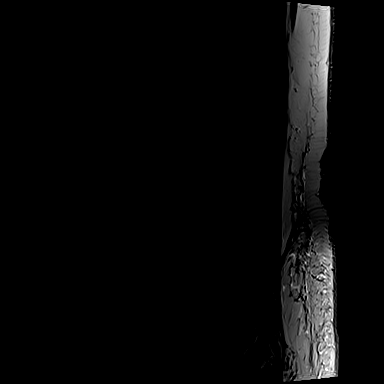
[im 10/15]
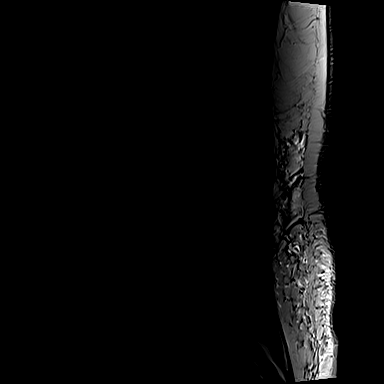
[im 15/15]
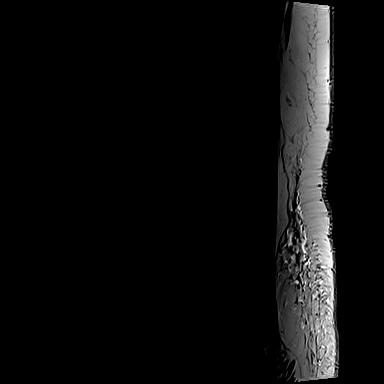

[20 of 48 positions shown; findings below may reference images not displayed]

FINDINGS: Segmentation:  Standard.

Alignment:  Trace anterolisthesis of L3 over L4 is unchanged.

Vertebrae: No fracture, evidence of discitis, or bone lesion.
Endplate degenerative changes at L3-4. Postsurgical changes from
left laminotomy and microdiscectomy at L4-5 loss of disc height,
disc bulge and moderate facet degenerative changes. Marrow edema and
contrast enhancement at the L4-5 facet joints.

Conus medullaris and cauda equina: Conus extends to the T12-L1
level. Conus and cauda equina appear normal.

Paraspinal and other soft tissues: Negative.

Disc levels:

T12-L1: Shallow disc bulge. No significant spinal canal or neural
foraminal stenosis.

L1-2: Shallow disc bulge. No significant spinal canal or neural
foraminal stenosis.

L2-3: Shallow disc bulge and mild facet degenerative changes. No
significant spinal canal or neural foraminal stenosis.

L3-4: Loss of disc height, disc bulge, slightly asymmetric to the
left with associated osteophytic component and superimposed small
right foraminal disc protrusion. Mild to moderate facet degenerative
changes mild ligamentum flavum redundancy. Findings result in mild
spinal canal stenosis with narrowing of the subarticular zones, left
greater than right, mild to moderate right and mild left neural
foraminal narrowing, unchanged from prior.

L4-5: Postsurgical changes from left laminotomy. Loss of disc
height, disc bulge with superimposed residual/recurrent left
subarticular inferiorly migrating disc extrusion causing effacement
of the left subarticular zone and impinging on the traversing left
L5 nerve root. Mild to moderate facet degenerative changes with
marrow edema and contrast enhancement. Mild right and moderate left
neural foraminal narrowing.

L5-S1: Shallow disc bulge and mild facet degenerative changes
without significant spinal canal or neural foraminal stenosis.
IMPRESSION: 1. Postsurgical changes at L4-5 with residual/recurrent left
subarticular disc extrusion impinging on the traversing left L5
root.
2. Other findings remain unchanged.

## 2021-11-28 MED ORDER — GADOBENATE DIMEGLUMINE 529 MG/ML IV SOLN
19.0000 mL | Freq: Once | INTRAVENOUS | Status: AC | PRN
  Administered 2021-11-28: 19 mL via INTRAVENOUS

## 2021-12-18 ENCOUNTER — Ambulatory Visit: Payer: 59 | Attending: Neurosurgery

## 2021-12-18 DIAGNOSIS — M6281 Muscle weakness (generalized): Secondary | ICD-10-CM | POA: Diagnosis present

## 2021-12-18 DIAGNOSIS — M5459 Other low back pain: Secondary | ICD-10-CM | POA: Diagnosis present

## 2021-12-18 NOTE — Therapy (Addendum)
[facility] [place], [place], [address] Phone: [phone]   Fax:  [phone]  Physical Therapy Evaluation  Patient Details  Name: Natalie [name] MRN: [id number] Date of Birth: 07/01/1961 Referring Provider (PT): Arnoldo Morale, MD   Encounter Date: 12/18/2021   PT End of Session - 12/18/21 1349     Visit Number 1    Number of Visits 1    PT Start Time 1351    PT Stop Time 1440    PT Time Calculation (min) 49 min    Activity Tolerance Patient tolerated treatment well    Behavior During Therapy [facility] for tasks assessed/performed             Past Medical History:  Diagnosis Date   Abnormally small mouth    Arthritis    knees   Carpal tunnel syndrome on right 09/2016   Dental crowns present    also dental caps   Family history of adverse reaction to anesthesia    pt's mother and son have hx. of post-op N/V   GERD (gastroesophageal reflux disease)    Hx of migraines    Hyperlipidemia    IBS (irritable bowel syndrome)    PONV (postoperative nausea and vomiting)    1 episode only    Past Surgical History:  Procedure Laterality Date   CARPAL TUNNEL RELEASE Right 10/03/2016   Procedure: Right limited open CARPAL TUNNEL RELEASE;  Surgeon: Roseanne Kaufman, MD;  Location: [place];  Service: Orthopedics;  Laterality: Right;   CESAREAN SECTION     CHOLECYSTECTOMY  05/27/2016   COLONOSCOPY WITH PROPOFOL  04/20/2015   CYSTO     INCISION / DEBRIDEMENT BONE ELBOW Right 06/14/2003   radial head   KNEE ARTHROSCOPY Left 01/22/2010   SALPINGOOPHORECTOMY Left 03/31/2002   STERIOD INJECTION Left 10/03/2016   Procedure: STEROID INJECTION;  Surgeon: Roseanne Kaufman, MD;  Location: [place];  Service: Orthopedics;  Laterality: Left;   TOTAL ABDOMINAL HYSTERECTOMY  03/31/2002    There were no vitals filed for this visit.    Subjective Assessment - 12/18/21 1350     Subjective Patient reports that  she had a ruptured disc in her low back and had surgery on 08/21/21. She notes that her back felt good until March when her low back pain returned and it is radiating down both legs. However, she feels that the left leg is worse as the pain in her left calf keeps her up at night. She feels that her pain is getting worse, but she feels that this may be partially due to her lack of sleep. She is waiting to hear from her insurance to determine if she needs PT prior to surgery, but wanted to come to her evaluation in the event that they require therapy.    Pertinent History trying to schedule TKA    Limitations House hold activities;Walking;Standing    How long can you walk comfortably? pain with standing and walking    Patient Stated Goals reduced pain    Currently in Pain? 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  No    Is the patient reluctant to leave their home because of a fear of falling?  No      Home Ecologist residence    Home Access Stairs to enter    Entrance Stairs-Number of Steps 3   step to  pattern due to knee pain   Entrance Stairs-Rails Left    Home Layout Two level      Prior Function   Level of Independence Independent    Vocation Full time employment    Film/video editor; prolonged standing and walking    Leisure reading, sewing, crafts, play with her grandchildren      Cognition   Overall Cognitive Status Within Functional Limits for tasks assessed    Attention Focused    Focused Attention Appears intact    Memory Appears intact    Awareness Appears intact    Problem Solving Appears intact      Sensation   Light Touch Impaired by gross assessment   diminished sensation throughout BLE   Additional Comments Patient reports that she has tingling radiating into both feet   Left foot numbness continued after January surgery     ROM / Strength   AROM / PROM / Strength Strength;AROM      AROM   AROM Assessment Site Lumbar    Lumbar Flexion 20    Lumbar Extension 10    Lumbar - Right Side Bend 50% limited   right low back pain   Lumbar - Left Side Bend 50% limited    Lumbar - Right Rotation 50% limited   low back pain   Lumbar - Left Rotation 50% limited   pain radiating down LLE     Strength   Strength Assessment Site Hip;Knee;Ankle    Right/Left Hip Right;Left    Right Hip Flexion 3+/5    Left Hip Flexion 3+/5   low back pain   Right/Left Knee Right;Left    Right Knee Flexion 4-/5    Right Knee Extension 3+/5    Left Knee Flexion 4-/5    Left Knee Extension 3+/5    Right/Left Ankle Right;Left    Right Ankle Dorsiflexion 3+/5    Left Ankle Dorsiflexion 3+/5      Palpation   Spinal mobility Lumbar: hypomobile with pain from L3-S1 with L4-5 being the most painful    Palpation comment TTP: bilateral lumbar paraspinals                        Objective measurements completed on examination: See above findings.       [facility] Adult PT Treatment/Exercise - 12/18/21 0001  Modalities   Modalities Engineer, manufacturing systems Stimulation Location bilateral lumbar paraspinals   no redness or adverse reaction to the modality   Electrical Stimulation Action IFC    Electrical Stimulation Parameters 80-150 Hz w/ 40% scan x 6 minutes   stopped due to patient discomfort   Electrical Stimulation Goals Pain                          PT Long Term Goals - 12/18/21 1526       PT LONG TERM GOAL #1   Title Evaluation only                    Plan - 12/18/21 1519     Clinical Impression Statement Patient is a 61 year old female presenting to physical therapy with chronic low back pain radiating into bilateral lower extremities. She presented with high pain severity and irritability. She also exhibited reduced lumbar AROM, muscular strength, and altered sensation in both lower extremities. She wished to hold additional therapy until it is determined if physical therapy is required by her insurance. Due to her symptom presentation, severity, and irritability, she may benefit from additional medical intervention to address her symptoms prior to additional physical therapy.    Personal Factors and Comorbidities Time since onset of injury/illness/exacerbation;Other;Comorbidity 2    Comorbidities OA, obesity    Examination-Activity Limitations Locomotion Level;Transfers;Sit;Sleep;Squat;Stairs;Stand;Lift;Bend    Examination-Participation Restrictions Occupation;Cleaning;Community Activity;Laundry    Stability/Clinical Decision Making Unstable/Unpredictable    Clinical Decision Making High    Rehab Potential Poor    PT Frequency One time visit    PT Treatment/Interventions Neuromuscular re-education;Electrical Stimulation;Cryotherapy;Moist Heat;Traction;Ultrasound;Therapeutic exercise;Therapeutic activities;Functional mobility training;Stair training;Gait training;Patient/family education;Manual techniques;Dry needling;Passive range of motion;Taping     Consulted and Agree with Plan of Care Patient             Patient will benefit from skilled therapeutic intervention in order to improve the following deficits and impairments:  Decreased range of motion, Difficulty walking, Decreased activity tolerance, Pain, Hypomobility, Decreased mobility, Decreased strength, Impaired sensation  Visit Diagnosis: Other low back pain  Muscle weakness (generalized)     Problem List Patient Active Problem List   Diagnosis Date Noted   Radiculopathy due to lumbar intervertebral disc disorder 08/06/2021   Back pain of lumbar region with sciatica 07/19/2021   Aortic atherosclerosis ([place]) 10/19/2020   History of nephrolithiasis 10/16/2020   Arthritis of both knees 07/11/2020   Hair loss 01/13/2020   Healthcare maintenance 01/13/2020   Mixed hyperlipidemia 09/22/2017   Migraines 09/22/2017   Morbid obesity due to excess calories ([place]) 05/26/2016   Irritable bowel syndrome with diarrhea 02/06/2015   GERD (gastroesophageal reflux disease) 09/09/2013   Endometriosis 09/09/2013    Darlin Coco, PT 12/18/2021, 3:28 PM  [facility] [address] [place], [place], [address] Phone: [phone]   Fax:  [phone]  Name: Natalie [name] MRN: [id number] Date of Birth: 11/24/1960  PHYSICAL THERAPY DISCHARGE SUMMARY  Visits from Start of Care: 1  Current functional level related to goals / functional outcomes: Patient is being discharged at this time as she requested to only attend her evaluation   Remaining deficits: See evaluation    Education / Equipment: Educated on benefits of therapy    Patient agrees to discharge. Patient goals were met. Patient is being discharged due to the patient's request.

## 2021-12-20 ENCOUNTER — Other Ambulatory Visit: Payer: Self-pay | Admitting: Neurosurgery

## 2021-12-27 NOTE — Progress Notes (Signed)
Surgical Instructions    Your procedure is scheduled on Monday, June 12th, 2023.   Report to [facility] Main Entrance "A" at 09:50 A.M., then check in with the Admitting office.  Call this number if you have problems the morning of surgery:  [phone]   If you have any questions prior to your surgery date call [phone]: Open Monday-Friday 8am-4pm    Remember:  Do not eat or drink after midnight the night before your surgery    Take these medicines the morning of surgery with A SIP OF WATER:   escitalopram (LEXAPRO) gabapentin (NEURONTIN)  loratadine (CLARITIN) rosuvastatin (CRESTOR)   If needed:   carboxymethylcellulose (REFRESH PLUS)  oxyCODONE-acetaminophen (PERCOCET/ROXICET) pantoprazole (PROTONIX) rizatriptan (MAXALT) valACYclovir (VALTREX)  Follow your surgeon's instructions on when to stop Aspirin.  If no instructions were given by your surgeon then you will need to call the office to get those instructions.     As of today, STOP taking any Aspirin (unless otherwise instructed by your surgeon) Aleve, Naproxen, Ibuprofen, Motrin, Advil, Goody's, BC's, all herbal medications, fish oil, and all vitamins.    The day of surgery:          Do not wear jewelry or makeup Do not wear lotions, powders, perfumes, or deodorant. Do not shave 48 hours prior to surgery.   Do not bring valuables to the hospital. Do not wear nail polish, gel polish, artificial nails, or any other type of covering on natural nails (fingers and toes) If you have artificial nails or gel coating that need to be removed by a nail salon, please have this removed prior to surgery. Artificial nails or gel coating may interfere with anesthesia's ability to adequately monitor your vital signs.  [facility] is not responsible for any belongings or valuables. .   Do NOT Smoke (Tobacco/Vaping)  24 hours prior to your procedure  If you use a CPAP at night, you may bring your mask for your overnight  stay.   Contacts, glasses, hearing aids, dentures or partials may not be worn into surgery, please bring cases for these belongings   For patients admitted to the hospital, discharge time will be determined by your treatment team.   Patients discharged the day of surgery will not be allowed to drive home, and someone needs to stay with them for 24 hours.   SURGICAL WAITING ROOM VISITATION Patients having surgery or a procedure in a hospital may have two support people. Children under the age of 26 must have an adult with them who is not the patient. They may stay in the waiting area during the procedure and may switch out with other visitors. If the patient needs to stay at the hospital during part of their recovery, the visitor guidelines for inpatient rooms apply.  Please refer to the [facility] website for the visitor guidelines for Inpatients (after your surgery is over and you are in a regular room).    Special instructions:    Oral Hygiene is also important to reduce your risk of infection.  Remember - BRUSH YOUR TEETH THE MORNING OF SURGERY WITH YOUR REGULAR TOOTHPASTE   [facility]- Preparing For Surgery  Before surgery, you can play an important role. Because skin is not sterile, your skin needs to be as free of germs as possible. You can reduce the number of germs on your skin by washing with CHG (chlorahexidine gluconate) Soap before surgery.  CHG is an antiseptic cleaner which kills germs and bonds with the skin  to continue killing germs even after washing.     Please do not use if you have an allergy to CHG or antibacterial soaps. If your skin becomes reddened/irritated stop using the CHG.  Do not shave (including legs and underarms) for at least 48 hours prior to first CHG shower. It is OK to shave your face.  Please follow these instructions carefully.     Shower the NIGHT BEFORE SURGERY and the MORNING OF SURGERY with CHG Soap.   If you chose to wash your hair, wash  your hair first as usual with your normal shampoo. After you shampoo, rinse your hair and body thoroughly to remove the shampoo.  Then [employer] and genitals (private parts) with your normal soap and rinse thoroughly to remove soap.  After that Use CHG Soap as you would any other liquid soap. You can apply CHG directly to the skin and wash gently with a scrungie or a clean washcloth.   Apply the CHG Soap to your body ONLY FROM THE NECK DOWN.  Do not use on open wounds or open sores. Avoid contact with your eyes, ears, mouth and genitals (private parts). Wash Face and genitals (private parts)  with your normal soap.   Wash thoroughly, paying special attention to the area where your surgery will be performed.  Thoroughly rinse your body with warm water from the neck down.  DO NOT shower/wash with your normal soap after using and rinsing off the CHG Soap.  Pat yourself dry with a CLEAN TOWEL.  Wear CLEAN PAJAMAS to bed the night before surgery  Place CLEAN SHEETS on your bed the night before your surgery  DO NOT SLEEP WITH PETS.   Day of Surgery:  Take a shower with CHG soap. Wear Clean/Comfortable clothing the morning of surgery Do not apply any deodorants/lotions.   Remember to brush your teeth WITH YOUR REGULAR TOOTHPASTE.    If you received a COVID test during your pre-op visit, it is requested that you wear a mask when out in public, stay away from anyone that may not be feeling well, and notify your surgeon if you develop symptoms. If you have been in contact with anyone that has tested positive in the last 10 days, please notify your surgeon.    Please read over the following fact sheets that you were given.

## 2021-12-29 ENCOUNTER — Other Ambulatory Visit: Payer: Self-pay | Admitting: Family Medicine

## 2021-12-29 DIAGNOSIS — M5440 Lumbago with sciatica, unspecified side: Secondary | ICD-10-CM

## 2021-12-29 DIAGNOSIS — M5116 Intervertebral disc disorders with radiculopathy, lumbar region: Secondary | ICD-10-CM

## 2021-12-30 ENCOUNTER — Encounter (HOSPITAL_COMMUNITY): Payer: Self-pay

## 2021-12-30 ENCOUNTER — Other Ambulatory Visit: Payer: Self-pay

## 2021-12-30 ENCOUNTER — Encounter (HOSPITAL_COMMUNITY)
Admission: RE | Admit: 2021-12-30 | Discharge: 2021-12-30 | Disposition: A | Discharge status: Home / Self Care | Payer: 59 | Source: Ambulatory Visit | Attending: Neurosurgery | Admitting: Neurosurgery

## 2021-12-30 VITALS — BP 150/76 | HR 75 | Temp 98.6°F | Resp 17 | Ht 62.0 in | Wt 206.5 lb

## 2021-12-30 DIAGNOSIS — Z01818 Encounter for other preprocedural examination: Secondary | ICD-10-CM | POA: Insufficient documentation

## 2021-12-30 HISTORY — DX: Personal history of urinary calculi: Z87.442

## 2021-12-30 LAB — TYPE AND SCREEN
ABO/RH(D): A POS
Antibody Screen: NEGATIVE

## 2021-12-30 LAB — CBC
HCT: 42.6 % (ref 36.0–46.0)
Hemoglobin: 13.4 g/dL (ref 12.0–15.0)
MCH: 27.6 pg (ref 26.0–34.0)
MCHC: 31.5 g/dL (ref 30.0–36.0)
MCV: 87.8 fL (ref 80.0–100.0)
Platelets: 265 10*3/uL (ref 150–400)
RBC: 4.85 MIL/uL (ref 3.87–5.11)
RDW: 13 % (ref 11.5–15.5)
WBC: 7.5 10*3/uL (ref 4.0–10.5)
nRBC: 0 % (ref 0.0–0.2)

## 2021-12-30 LAB — BASIC METABOLIC PANEL
Anion gap: 7 (ref 5–15)
BUN: 8 mg/dL (ref 8–23)
CO2: 28 mmol/L (ref 22–32)
Calcium: 8.8 mg/dL — ABNORMAL LOW (ref 8.9–10.3)
Chloride: 102 mmol/L (ref 98–111)
Creatinine, Ser: 0.68 mg/dL (ref 0.44–1.00)
GFR, Estimated: >60 mL/min (ref >60)
Glucose, Bld: 97 mg/dL (ref 70–99)
Potassium: 3.9 mmol/L (ref 3.5–5.1)
Sodium: 137 mmol/L (ref 135–145)

## 2021-12-30 LAB — SURGICAL PCR SCREEN
MRSA, PCR: NEGATIVE
Staphylococcus aureus: POSITIVE — AB

## 2021-12-30 NOTE — Progress Notes (Signed)
PCP - Mliss Sax, MD Cardiologist - denies  PPM/ICD - denies Device Orders - n/a Rep Notified - n/a  Chest x-ray - 07/12/2021 EKG - 06/05 20223 Stress Test - 09/03/2016 - negative per patient ECHO - 05/27/2016 Cardiac Cath - denies  Sleep Study - denies CPAP - n/a  Fasting Blood Sugar - n/a  Blood Thinner Instructions: n/a  Aspirin Instructions - last dose - 12/20/21  Patient was instructed: As of today, STOP taking any Aspirin (unless otherwise instructed by your surgeon) Aleve, Naproxen, Ibuprofen, Motrin, Advil, Goody's, BC's, all herbal medications, fish oil, and all vitamins.  ERAS Protcol - n/a   COVID TEST- n/a   Anesthesia review: no  Patient denies shortness of breath, fever, cough and chest pain at PAT appointment   All instructions explained to the patient, with a verbal understanding of the material. Patient agrees to go over the instructions while at home for a better understanding. Patient also instructed to self quarantine after being tested for COVID-19. The opportunity to ask questions was provided.

## 2021-12-31 ENCOUNTER — Telehealth: Payer: Self-pay

## 2021-12-31 NOTE — Telephone Encounter (Signed)
Spoke with pt regarding overdue mammogram, pt states she receives here at her OBGYN when she has her PAP done pt was able to give name of Provider who does orders both PAP and Mammogram request for records has been sent over to the OBGYN.

## 2022-01-01 ENCOUNTER — Other Ambulatory Visit: Payer: Self-pay | Admitting: Neurosurgery

## 2022-01-03 ENCOUNTER — Encounter: Payer: Self-pay | Admitting: Family Medicine

## 2022-01-06 ENCOUNTER — Other Ambulatory Visit: Payer: Self-pay

## 2022-01-06 ENCOUNTER — Encounter (HOSPITAL_COMMUNITY)
Admission: RE | Disposition: A | Discharge status: Home / Self Care | Payer: Self-pay | Source: Home / Self Care | Attending: Neurosurgery

## 2022-01-06 ENCOUNTER — Ambulatory Visit (HOSPITAL_COMMUNITY): Payer: 59

## 2022-01-06 ENCOUNTER — Ambulatory Visit (HOSPITAL_BASED_OUTPATIENT_CLINIC_OR_DEPARTMENT_OTHER): Payer: 59 | Admitting: Certified Registered"

## 2022-01-06 ENCOUNTER — Ambulatory Visit (HOSPITAL_COMMUNITY)
Admission: RE | Admit: 2022-01-06 | Discharge: 2022-01-07 | Disposition: A | Discharge status: Home / Self Care | Payer: 59 | Attending: Neurosurgery | Admitting: Neurosurgery

## 2022-01-06 ENCOUNTER — Encounter (HOSPITAL_COMMUNITY): Payer: Self-pay | Admitting: Neurosurgery

## 2022-01-06 ENCOUNTER — Ambulatory Visit (HOSPITAL_COMMUNITY): Payer: 59 | Admitting: Certified Registered"

## 2022-01-06 DIAGNOSIS — M5116 Intervertebral disc disorders with radiculopathy, lumbar region: Secondary | ICD-10-CM | POA: Insufficient documentation

## 2022-01-06 DIAGNOSIS — M48062 Spinal stenosis, lumbar region with neurogenic claudication: Secondary | ICD-10-CM | POA: Insufficient documentation

## 2022-01-06 DIAGNOSIS — M5126 Other intervertebral disc displacement, lumbar region: Secondary | ICD-10-CM | POA: Diagnosis present

## 2022-01-06 DIAGNOSIS — R519 Headache, unspecified: Secondary | ICD-10-CM | POA: Diagnosis not present

## 2022-01-06 DIAGNOSIS — Z6838 Body mass index (BMI) 38.0-38.9, adult: Secondary | ICD-10-CM | POA: Insufficient documentation

## 2022-01-06 DIAGNOSIS — K219 Gastro-esophageal reflux disease without esophagitis: Secondary | ICD-10-CM | POA: Insufficient documentation

## 2022-01-06 DIAGNOSIS — M1990 Unspecified osteoarthritis, unspecified site: Secondary | ICD-10-CM | POA: Diagnosis not present

## 2022-01-06 DIAGNOSIS — Z01818 Encounter for other preprocedural examination: Secondary | ICD-10-CM

## 2022-01-06 DIAGNOSIS — M5117 Intervertebral disc disorders with radiculopathy, lumbosacral region: Secondary | ICD-10-CM | POA: Insufficient documentation

## 2022-01-06 DIAGNOSIS — E669 Obesity, unspecified: Secondary | ICD-10-CM | POA: Diagnosis not present

## 2022-01-06 LAB — ABO/RH: ABO/RH(D): A POS

## 2022-01-06 IMAGING — RF DG LUMBAR SPINE 2-3V
1 series · 2 of 2 positions shown · non-contrast
Comparison: None Available.

CLINICAL DATA: L3-L5 PLIF

EXAM:
LUMBAR SPINE - 2-3 VIEW

[Series 1: run · 2 of 2 slices shown]
[im 1/2]
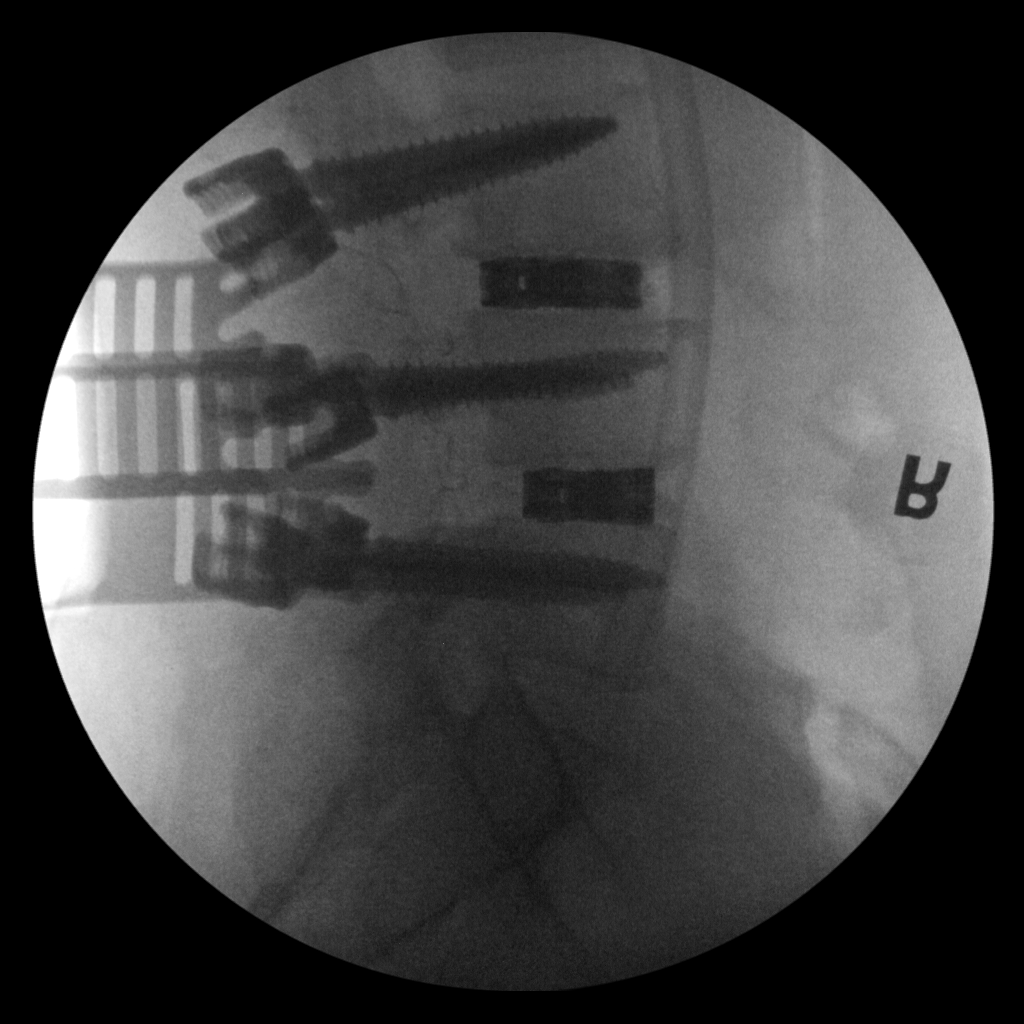
[im 2/2]
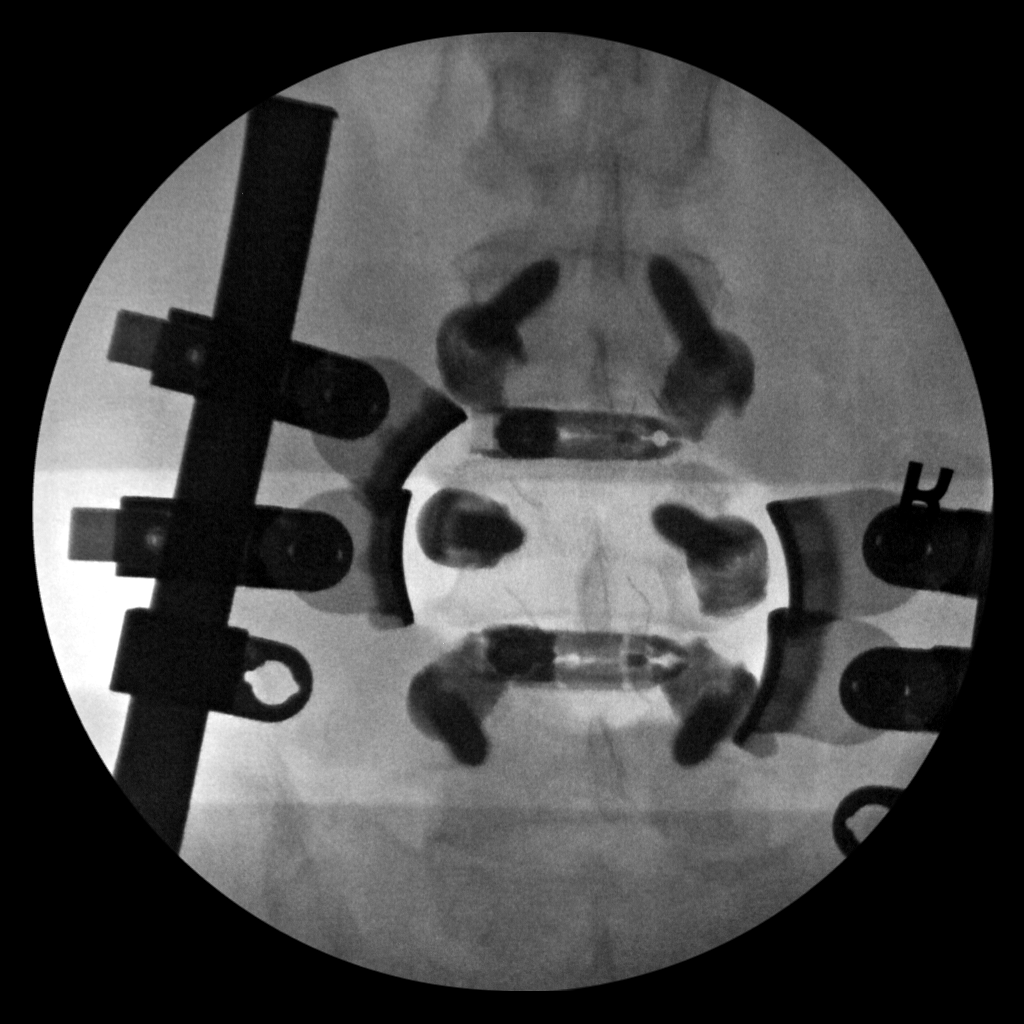

[2 of 2 positions shown; findings below may reference images not displayed]

FINDINGS: Multiple intraoperative fluoroscopic spot images are provided.
Interval PLIF from L3 through L5.

FLUOROSCOPY TIME:  Radiation Exposure Index: 25.5 mGy
IMPRESSION: 1. Interval PLIF from L3 through L5.

## 2022-01-06 IMAGING — CR DG LUMBAR SPINE 1V
1 series · 1 of 1 positions shown · non-contrast
Comparison: MRI [DATE]

CLINICAL DATA: Localization for L3-5 trans pedicle screw fixation

EXAM:
LUMBAR SPINE - 1 VIEW

[lateral]
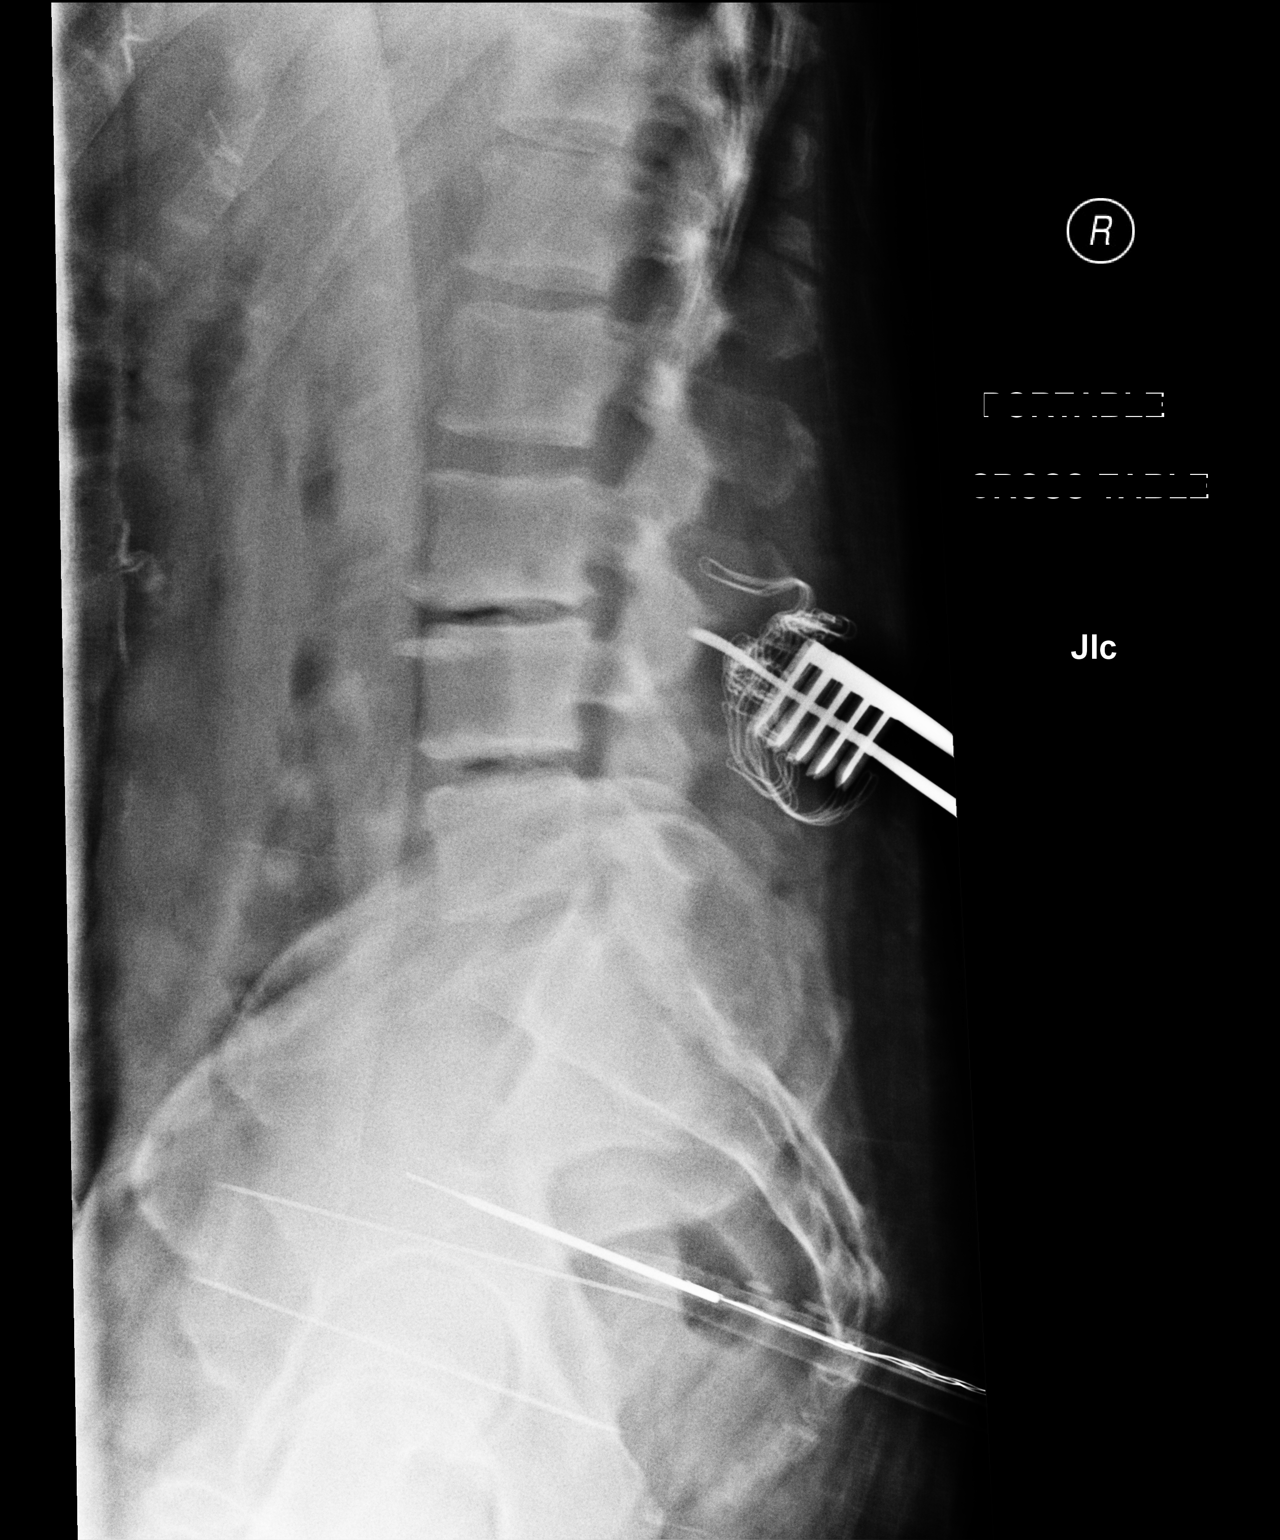

[1 of 1 positions shown; findings below may reference images not displayed]

FINDINGS: Single intraoperative view labeled [DATE] p.m. demonstrates a surgical
device projecting posterior to the L3-4 level. Degenerative disc
disease involves L3-4 and L4-5.
IMPRESSION: Intraoperative localization of L3-4.

## 2022-01-06 SURGERY — POSTERIOR LUMBAR FUSION 2 LEVEL
Anesthesia: General | Site: Back

## 2022-01-06 MED ORDER — SUMATRIPTAN SUCCINATE 50 MG PO TABS
50.0000 mg | ORAL_TABLET | ORAL | Status: DC | PRN
  Filled 2022-01-06: qty 1

## 2022-01-06 MED ORDER — FENTANYL CITRATE (PF) 250 MCG/5ML IJ SOLN
INTRAMUSCULAR | Status: AC
  Filled 2022-01-06: qty 5

## 2022-01-06 MED ORDER — HYDROMORPHONE HCL 1 MG/ML IJ SOLN
INTRAMUSCULAR | Status: DC | PRN
  Administered 2022-01-06: .5 mg via INTRAVENOUS

## 2022-01-06 MED ORDER — CHLORHEXIDINE GLUCONATE 0.12 % MT SOLN
15.0000 mL | Freq: Once | OROMUCOSAL | Status: AC
  Administered 2022-01-06: 15 mL via OROMUCOSAL
  Filled 2022-01-06: qty 15

## 2022-01-06 MED ORDER — SODIUM CHLORIDE 0.9% FLUSH: 3.0000 mL | INTRAVENOUS | Status: DC | PRN

## 2022-01-06 MED ORDER — HYDROMORPHONE HCL 1 MG/ML IJ SOLN
INTRAMUSCULAR | Status: AC
  Filled 2022-01-06: qty 1

## 2022-01-06 MED ORDER — ONDANSETRON HCL 4 MG PO TABS
4.0000 mg | ORAL_TABLET | Freq: Four times a day (QID) | ORAL | Status: DC | PRN
Start: 2022-01-06 — End: 2022-01-07

## 2022-01-06 MED ORDER — DEXAMETHASONE SODIUM PHOSPHATE 10 MG/ML IJ SOLN
INTRAMUSCULAR | Status: AC
  Filled 2022-01-06: qty 1

## 2022-01-06 MED ORDER — PROPOFOL 10 MG/ML IV BOLUS
INTRAVENOUS | Status: DC | PRN
  Administered 2022-01-06: 130 mg via INTRAVENOUS

## 2022-01-06 MED ORDER — MIDAZOLAM HCL 2 MG/2ML IJ SOLN
INTRAMUSCULAR | Status: AC
  Filled 2022-01-06: qty 2

## 2022-01-06 MED ORDER — DEXAMETHASONE SODIUM PHOSPHATE 10 MG/ML IJ SOLN
INTRAMUSCULAR | Status: DC | PRN
  Administered 2022-01-06: 10 mg via INTRAVENOUS

## 2022-01-06 MED ORDER — SODIUM CHLORIDE 0.9% FLUSH: 3.0000 mL | Freq: Two times a day (BID) | INTRAVENOUS | Status: DC

## 2022-01-06 MED ORDER — ACETAMINOPHEN 325 MG PO TABS
650.0000 mg | ORAL_TABLET | ORAL | Status: DC | PRN
  Administered 2022-01-06: 650 mg via ORAL
  Filled 2022-01-06: qty 2

## 2022-01-06 MED ORDER — ESCITALOPRAM OXALATE 10 MG PO TABS
5.0000 mg | ORAL_TABLET | Freq: Every day | ORAL | Status: DC
  Filled 2022-01-06: qty 1

## 2022-01-06 MED ORDER — ORAL CARE MOUTH RINSE: 15.0000 mL | Freq: Once | OROMUCOSAL | Status: AC

## 2022-01-06 MED ORDER — MORPHINE SULFATE (PF) 4 MG/ML IV SOLN: 4.0000 mg | INTRAVENOUS | Status: DC | PRN

## 2022-01-06 MED ORDER — THROMBIN 5000 UNITS EX SOLR
CUTANEOUS | Status: AC
  Filled 2022-01-06: qty 5000

## 2022-01-06 MED ORDER — OXYCODONE HCL 5 MG PO TABS
5.0000 mg | ORAL_TABLET | Freq: Once | ORAL | Status: AC | PRN
  Administered 2022-01-06: 5 mg via ORAL

## 2022-01-06 MED ORDER — DEXMEDETOMIDINE (PRECEDEX) IN NS 20 MCG/5ML (4 MCG/ML) IV SYRINGE
PREFILLED_SYRINGE | INTRAVENOUS | Status: DC | PRN
  Administered 2022-01-06: 8 ug via INTRAVENOUS

## 2022-01-06 MED ORDER — EPHEDRINE SULFATE-NACL 50-0.9 MG/10ML-% IV SOSY
PREFILLED_SYRINGE | INTRAVENOUS | Status: DC | PRN
  Administered 2022-01-06: 5 mg via INTRAVENOUS

## 2022-01-06 MED ORDER — BACITRACIN ZINC 500 UNIT/GM EX OINT
TOPICAL_OINTMENT | CUTANEOUS | Status: DC | PRN
  Administered 2022-01-06: 1 via TOPICAL

## 2022-01-06 MED ORDER — HYDROMORPHONE HCL 1 MG/ML IJ SOLN
0.2500 mg | INTRAMUSCULAR | Status: DC | PRN
  Administered 2022-01-06 (×4): 0.5 mg via INTRAVENOUS

## 2022-01-06 MED ORDER — LIDOCAINE 2% (20 MG/ML) 5 ML SYRINGE
INTRAMUSCULAR | Status: DC | PRN
  Administered 2022-01-06: 60 mg via INTRAVENOUS

## 2022-01-06 MED ORDER — BUPIVACAINE LIPOSOME 1.3 % IJ SUSP
INTRAMUSCULAR | Status: AC
Start: 2022-01-06 — End: ?
  Filled 2022-01-06: qty 20

## 2022-01-06 MED ORDER — BUPIVACAINE-EPINEPHRINE 0.5% -1:200000 IJ SOLN
INTRAMUSCULAR | Status: AC
  Filled 2022-01-06: qty 1

## 2022-01-06 MED ORDER — ROSUVASTATIN CALCIUM 5 MG PO TABS: 10.0000 mg | ORAL_TABLET | Freq: Every day | ORAL | Status: DC

## 2022-01-06 MED ORDER — MEPERIDINE HCL 25 MG/ML IJ SOLN: 6.2500 mg | INTRAMUSCULAR | Status: DC | PRN

## 2022-01-06 MED ORDER — ALBUMIN HUMAN 5 % IV SOLN: INTRAVENOUS | Status: DC | PRN

## 2022-01-06 MED ORDER — OXYCODONE HCL 5 MG PO TABS: 5.0000 mg | ORAL_TABLET | ORAL | Status: DC | PRN

## 2022-01-06 MED ORDER — ONDANSETRON HCL 4 MG/2ML IJ SOLN
INTRAMUSCULAR | Status: DC | PRN
  Administered 2022-01-06: 4 mg via INTRAVENOUS

## 2022-01-06 MED ORDER — PANTOPRAZOLE SODIUM 40 MG PO TBEC: 40.0000 mg | DELAYED_RELEASE_TABLET | Freq: Every day | ORAL | Status: DC | PRN

## 2022-01-06 MED ORDER — BUPIVACAINE LIPOSOME 1.3 % IJ SUSP
INTRAMUSCULAR | Status: DC | PRN
  Administered 2022-01-06 (×2): 20 mL

## 2022-01-06 MED ORDER — GLYCOPYRROLATE PF 0.2 MG/ML IJ SOSY
PREFILLED_SYRINGE | INTRAMUSCULAR | Status: AC
Start: 2022-01-06 — End: ?
  Filled 2022-01-06: qty 1

## 2022-01-06 MED ORDER — CARBOXYMETHYLCELLULOSE SODIUM 0.5 % OP SOLN
1.0000 [drp] | Freq: Three times a day (TID) | OPHTHALMIC | Status: DC | PRN
Start: 2022-01-06 — End: 2022-01-06

## 2022-01-06 MED ORDER — CHLORHEXIDINE GLUCONATE CLOTH 2 % EX PADS: 6.0000 | MEDICATED_PAD | Freq: Once | CUTANEOUS | Status: DC

## 2022-01-06 MED ORDER — OXYCODONE HCL 5 MG/5ML PO SOLN: 5.0000 mg | Freq: Once | ORAL | Status: AC | PRN

## 2022-01-06 MED ORDER — ACETAMINOPHEN 650 MG RE SUPP: 650.0000 mg | RECTAL | Status: DC | PRN

## 2022-01-06 MED ORDER — ROCURONIUM BROMIDE 10 MG/ML (PF) SYRINGE
PREFILLED_SYRINGE | INTRAVENOUS | Status: DC | PRN
  Administered 2022-01-06: 70 mg via INTRAVENOUS
  Administered 2022-01-06: 20 mg via INTRAVENOUS
  Administered 2022-01-06: 30 mg via INTRAVENOUS

## 2022-01-06 MED ORDER — EPHEDRINE 5 MG/ML INJ
INTRAVENOUS | Status: AC
Start: 2022-01-06 — End: ?
  Filled 2022-01-06: qty 5

## 2022-01-06 MED ORDER — ROCURONIUM BROMIDE 10 MG/ML (PF) SYRINGE
PREFILLED_SYRINGE | INTRAVENOUS | Status: AC
Start: 2022-01-06 — End: ?
  Filled 2022-01-06: qty 10

## 2022-01-06 MED ORDER — HYDROMORPHONE HCL 1 MG/ML IJ SOLN
INTRAMUSCULAR | Status: AC
  Filled 2022-01-06: qty 0.5

## 2022-01-06 MED ORDER — MENTHOL 3 MG MT LOZG: 1.0000 | LOZENGE | OROMUCOSAL | Status: DC | PRN

## 2022-01-06 MED ORDER — PHENYLEPHRINE HCL-NACL 20-0.9 MG/250ML-% IV SOLN
INTRAVENOUS | Status: DC | PRN
  Administered 2022-01-06: 25 ug/min via INTRAVENOUS

## 2022-01-06 MED ORDER — PHENOL 1.4 % MT LIQD: 1.0000 | OROMUCOSAL | Status: DC | PRN

## 2022-01-06 MED ORDER — HYDROCODONE-ACETAMINOPHEN 5-325 MG PO TABS: 1.0000 | ORAL_TABLET | ORAL | Status: DC | PRN

## 2022-01-06 MED ORDER — PROMETHAZINE HCL 25 MG/ML IJ SOLN: 6.2500 mg | INTRAMUSCULAR | Status: DC | PRN

## 2022-01-06 MED ORDER — LORATADINE 10 MG PO TABS
10.0000 mg | ORAL_TABLET | Freq: Every day | ORAL | Status: DC
  Filled 2022-01-06: qty 1

## 2022-01-06 MED ORDER — SUGAMMADEX SODIUM 500 MG/5ML IV SOLN
INTRAVENOUS | Status: DC | PRN
  Administered 2022-01-06: 500 mg via INTRAVENOUS

## 2022-01-06 MED ORDER — ZOLPIDEM TARTRATE 5 MG PO TABS: 5.0000 mg | ORAL_TABLET | Freq: Every evening | ORAL | Status: DC | PRN

## 2022-01-06 MED ORDER — LACTATED RINGERS IV SOLN: INTRAVENOUS | Status: DC

## 2022-01-06 MED ORDER — OXYCODONE HCL 5 MG PO TABS
ORAL_TABLET | ORAL | Status: AC
  Filled 2022-01-06: qty 1

## 2022-01-06 MED ORDER — OXYCODONE-ACETAMINOPHEN 5-325 MG PO TABS: 1.0000 | ORAL_TABLET | ORAL | Status: DC | PRN

## 2022-01-06 MED ORDER — 0.9 % SODIUM CHLORIDE (POUR BTL) OPTIME
TOPICAL | Status: DC | PRN
  Administered 2022-01-06: 1000 mL

## 2022-01-06 MED ORDER — LIDOCAINE 2% (20 MG/ML) 5 ML SYRINGE
INTRAMUSCULAR | Status: AC
Start: 2022-01-06 — End: ?
  Filled 2022-01-06: qty 5

## 2022-01-06 MED ORDER — GLYCOPYRROLATE 0.2 MG/ML IJ SOLN: INTRAMUSCULAR | Status: DC | PRN

## 2022-01-06 MED ORDER — PHENYLEPHRINE 80 MCG/ML (10ML) SYRINGE FOR IV PUSH (FOR BLOOD PRESSURE SUPPORT)
PREFILLED_SYRINGE | INTRAVENOUS | Status: AC
  Filled 2022-01-06: qty 10

## 2022-01-06 MED ORDER — CEFAZOLIN SODIUM-DEXTROSE 2-4 GM/100ML-% IV SOLN
2.0000 g | Freq: Three times a day (TID) | INTRAVENOUS | Status: AC
  Administered 2022-01-06 – 2022-01-07 (×2): 2 g via INTRAVENOUS
  Filled 2022-01-06 (×2): qty 100

## 2022-01-06 MED ORDER — POLYVINYL ALCOHOL 1.4 % OP SOLN
1.0000 [drp] | Freq: Three times a day (TID) | OPHTHALMIC | Status: DC | PRN
  Filled 2022-01-06: qty 15

## 2022-01-06 MED ORDER — DOCUSATE SODIUM 100 MG PO CAPS
100.0000 mg | ORAL_CAPSULE | Freq: Two times a day (BID) | ORAL | Status: DC
  Administered 2022-01-06: 100 mg via ORAL
  Filled 2022-01-06: qty 1

## 2022-01-06 MED ORDER — CEFAZOLIN SODIUM-DEXTROSE 2-4 GM/100ML-% IV SOLN
2.0000 g | INTRAVENOUS | Status: AC
  Administered 2022-01-06: 2 g via INTRAVENOUS
  Filled 2022-01-06: qty 100

## 2022-01-06 MED ORDER — OXYCODONE HCL 5 MG PO TABS
10.0000 mg | ORAL_TABLET | ORAL | Status: DC | PRN
  Administered 2022-01-06 – 2022-01-07 (×4): 10 mg via ORAL
  Filled 2022-01-06 (×4): qty 2

## 2022-01-06 MED ORDER — ONDANSETRON HCL 4 MG/2ML IJ SOLN
INTRAMUSCULAR | Status: AC
  Filled 2022-01-06: qty 2

## 2022-01-06 MED ORDER — MIDAZOLAM HCL 2 MG/2ML IJ SOLN
INTRAMUSCULAR | Status: DC | PRN
  Administered 2022-01-06 (×2): 1 mg via INTRAVENOUS

## 2022-01-06 MED ORDER — BUPIVACAINE-EPINEPHRINE (PF) 0.5% -1:200000 IJ SOLN
INTRAMUSCULAR | Status: DC | PRN
  Administered 2022-01-06: 10 mL via PERINEURAL

## 2022-01-06 MED ORDER — ACETAMINOPHEN 500 MG PO TABS
1000.0000 mg | ORAL_TABLET | Freq: Four times a day (QID) | ORAL | Status: DC
  Administered 2022-01-06 – 2022-01-07 (×2): 1000 mg via ORAL
  Filled 2022-01-06 (×2): qty 2

## 2022-01-06 MED ORDER — GABAPENTIN 300 MG PO CAPS
300.0000 mg | ORAL_CAPSULE | Freq: Three times a day (TID) | ORAL | Status: DC
  Administered 2022-01-06: 300 mg via ORAL
  Filled 2022-01-06: qty 1

## 2022-01-06 MED ORDER — FENTANYL CITRATE (PF) 250 MCG/5ML IJ SOLN
INTRAMUSCULAR | Status: DC | PRN
Start: 2022-01-06 — End: 2022-01-06
  Administered 2022-01-06: 75 ug via INTRAVENOUS
  Administered 2022-01-06 (×2): 50 ug via INTRAVENOUS
  Administered 2022-01-06: 75 ug via INTRAVENOUS
  Administered 2022-01-06: 50 ug via INTRAVENOUS

## 2022-01-06 MED ORDER — SODIUM CHLORIDE 0.9 % IV SOLN: 250.0000 mL | INTRAVENOUS | Status: DC

## 2022-01-06 MED ORDER — AMISULPRIDE (ANTIEMETIC) 5 MG/2ML IV SOLN
INTRAVENOUS | Status: AC
  Filled 2022-01-06: qty 4

## 2022-01-06 MED ORDER — THROMBIN 5000 UNITS EX SOLR: OROMUCOSAL | Status: DC | PRN

## 2022-01-06 MED ORDER — GLYCOPYRROLATE 0.2 MG/ML IJ SOLN
INTRAMUSCULAR | Status: DC | PRN
  Administered 2022-01-06: .2 mg via INTRAVENOUS

## 2022-01-06 MED ORDER — PHENYLEPHRINE 80 MCG/ML (10ML) SYRINGE FOR IV PUSH (FOR BLOOD PRESSURE SUPPORT)
PREFILLED_SYRINGE | INTRAVENOUS | Status: DC | PRN
  Administered 2022-01-06 (×4): 80 ug via INTRAVENOUS

## 2022-01-06 MED ORDER — CYCLOBENZAPRINE HCL 10 MG PO TABS
10.0000 mg | ORAL_TABLET | Freq: Three times a day (TID) | ORAL | Status: DC | PRN
  Administered 2022-01-06 – 2022-01-07 (×2): 10 mg via ORAL
  Filled 2022-01-06 (×2): qty 1

## 2022-01-06 MED ORDER — BISACODYL 10 MG RE SUPP: 10.0000 mg | Freq: Every day | RECTAL | Status: DC | PRN

## 2022-01-06 MED ORDER — AMISULPRIDE (ANTIEMETIC) 5 MG/2ML IV SOLN
10.0000 mg | Freq: Once | INTRAVENOUS | Status: AC | PRN
  Administered 2022-01-06: 10 mg via INTRAVENOUS

## 2022-01-06 MED ORDER — ONDANSETRON HCL 4 MG/2ML IJ SOLN
4.0000 mg | Freq: Four times a day (QID) | INTRAMUSCULAR | Status: DC | PRN
Start: 2022-01-06 — End: 2022-01-07

## 2022-01-06 SURGICAL SUPPLY — 67 items
BAG COUNTER SPONGE SURGICOUNT (BAG) ×2 IMPLANT
BASKET BONE COLLECTION (BASKET) ×2 IMPLANT
BENZOIN TINCTURE PRP APPL 2/3 (GAUZE/BANDAGES/DRESSINGS) ×2 IMPLANT
BLADE BONE MILL MEDIUM (MISCELLANEOUS) IMPLANT
BLADE CLIPPER SURG (BLADE) IMPLANT
BUR CARBIDE MATCH 3.0 (BURR) ×1 IMPLANT
BUR MATCHSTICK NEURO 3.0 LAGG (BURR) ×2 IMPLANT
BUR PRECISION FLUTE 6.0 (BURR) ×2 IMPLANT
CAGE ALTERA 10X31X9-13 15D (Cage) ×1 IMPLANT
CANISTER SUCT 3000ML PPV (MISCELLANEOUS) ×2 IMPLANT
CAP LOCK DLX THRD (Cap) ×6 IMPLANT
CARTRIDGE OIL MAESTRO DRILL (MISCELLANEOUS) ×1 IMPLANT
CLSR STERI-STRIP ANTIMIC 1/2X4 (GAUZE/BANDAGES/DRESSINGS) ×1 IMPLANT
CNTNR URN SCR LID CUP LEK RST (MISCELLANEOUS) ×1 IMPLANT
CONT SPEC 4OZ STRL OR WHT (MISCELLANEOUS) ×2
COVER BACK TABLE 60X90IN (DRAPES) ×2 IMPLANT
DIFFUSER DRILL AIR PNEUMATIC (MISCELLANEOUS) ×2 IMPLANT
DRAPE C-ARM 42X72 X-RAY (DRAPES) ×4 IMPLANT
DRAPE HALF SHEET 40X57 (DRAPES) ×2 IMPLANT
DRAPE LAPAROTOMY 100X72X124 (DRAPES) ×2 IMPLANT
DRAPE SURG 17X23 STRL (DRAPES) ×8 IMPLANT
DRSG OPSITE POSTOP 4X10 (GAUZE/BANDAGES/DRESSINGS) ×1 IMPLANT
DRSG OPSITE POSTOP 4X6 (GAUZE/BANDAGES/DRESSINGS) ×2 IMPLANT
ELECT BLADE 4.0 EZ CLEAN MEGAD (MISCELLANEOUS) ×2
ELECT REM PT RETURN 9FT ADLT (ELECTROSURGICAL) ×2
ELECTRODE BLDE 4.0 EZ CLN MEGD (MISCELLANEOUS) ×1 IMPLANT
ELECTRODE REM PT RTRN 9FT ADLT (ELECTROSURGICAL) ×1 IMPLANT
GAUZE 4X4 16PLY ~~LOC~~+RFID DBL (SPONGE) ×2 IMPLANT
GLOVE BIO SURGEON STRL SZ8 (GLOVE) ×4 IMPLANT
GLOVE BIO SURGEON STRL SZ8.5 (GLOVE) ×4 IMPLANT
GLOVE EXAM NITRILE XL STR (GLOVE) IMPLANT
GOWN STRL REUS W/ TWL LRG LVL3 (GOWN DISPOSABLE) IMPLANT
GOWN STRL REUS W/ TWL XL LVL3 (GOWN DISPOSABLE) ×2 IMPLANT
GOWN STRL REUS W/TWL 2XL LVL3 (GOWN DISPOSABLE) IMPLANT
GOWN STRL REUS W/TWL LRG LVL3 (GOWN DISPOSABLE)
GOWN STRL REUS W/TWL XL LVL3 (GOWN DISPOSABLE) ×4
HEMOSTAT POWDER KIT SURGIFOAM (HEMOSTASIS) ×2 IMPLANT
KIT BASIN OR (CUSTOM PROCEDURE TRAY) ×2 IMPLANT
KIT GRAFTMAG DEL NEURO DISP (NEUROSURGERY SUPPLIES) ×1 IMPLANT
KIT TURNOVER KIT B (KITS) ×2 IMPLANT
MILL BONE PREP (MISCELLANEOUS) ×2 IMPLANT
NDL HYPO 21X1.5 SAFETY (NEEDLE) IMPLANT
NEEDLE HYPO 21X1.5 SAFETY (NEEDLE) IMPLANT
NEEDLE HYPO 22GX1.5 SAFETY (NEEDLE) ×2 IMPLANT
NS IRRIG 1000ML POUR BTL (IV SOLUTION) ×2 IMPLANT
OIL CARTRIDGE MAESTRO DRILL (MISCELLANEOUS) ×2
PACK LAMINECTOMY NEURO (CUSTOM PROCEDURE TRAY) ×2 IMPLANT
PAD ARMBOARD 7.5X6 YLW CONV (MISCELLANEOUS) ×6 IMPLANT
PATTIES SURGICAL .5 X1 (DISPOSABLE) IMPLANT
PUTTY DBM 2CC CALC GRAN (Putty) ×2 IMPLANT
PUTTY DBM 5CC CALC GRAN (Putty) ×2 IMPLANT
ROD CURVED TI 6.35X70 (Rod) ×2 IMPLANT
SCREW PA DLX CREO 7.5X50 (Screw) ×6 IMPLANT
SPACER ALTERA 10X31 8-12MM-8 (Spacer) ×1 IMPLANT
SPIKE FLUID TRANSFER (MISCELLANEOUS) ×2 IMPLANT
SPONGE NEURO XRAY DETECT 1X3 (DISPOSABLE) IMPLANT
SPONGE SURGIFOAM ABS GEL 100 (HEMOSTASIS) IMPLANT
SPONGE T-LAP 4X18 ~~LOC~~+RFID (SPONGE) IMPLANT
STRIP CLOSURE SKIN 1/2X4 (GAUZE/BANDAGES/DRESSINGS) ×2 IMPLANT
SUT VIC AB 1 CT1 18XBRD ANBCTR (SUTURE) ×2 IMPLANT
SUT VIC AB 1 CT1 8-18 (SUTURE) ×4
SUT VIC AB 2-0 CP2 18 (SUTURE) ×4 IMPLANT
SYR 20ML LL LF (SYRINGE) IMPLANT
TOWEL GREEN STERILE (TOWEL DISPOSABLE) ×2 IMPLANT
TOWEL GREEN STERILE FF (TOWEL DISPOSABLE) ×2 IMPLANT
TRAY FOLEY MTR SLVR 16FR STAT (SET/KITS/TRAYS/PACK) ×2 IMPLANT
WATER STERILE IRR 1000ML POUR (IV SOLUTION) ×2 IMPLANT

## 2022-01-06 NOTE — Transfer of Care (Signed)
Immediate Anesthesia Transfer of Care Note  Patient: Natalie [name]  Procedure(s) Performed: PLIF - Posterior Lateral and Interbody fusion, IP, Posterion instrumentation - L3-L4 - L4-L5 (Back)  Patient Location: PACU  Anesthesia Type:General  Level of Consciousness: drowsy and patient cooperative  Airway & Oxygen Therapy: Patient Spontanous Breathing and Patient connected to face mask oxygen  Post-op Assessment: Report given to RN and Post -op Vital signs reviewed and stable  Post vital signs: Reviewed and stable  Last Vitals:  Vitals Value Taken Time  BP 132/73 01/06/22 1742  Temp    Pulse 95 01/06/22 1747  Resp 25 01/06/22 1747  SpO2 91 % 01/06/22 1747  Vitals shown include unvalidated device data.  Last Pain:  Vitals:   01/06/22 1017  TempSrc:   PainSc: 7       Patients Stated Pain Goal: 0 (01/06/22 1017)  Complications: No notable events documented.

## 2022-01-06 NOTE — Anesthesia Procedure Notes (Signed)
Procedure Name: Intubation Date/Time: 01/06/2022 1:32 PM  Performed by: Marena Chancy, CRNAPre-anesthesia Checklist: Patient identified, Emergency Drugs available, Suction available and Patient being monitored Patient Re-evaluated:Patient Re-evaluated prior to induction Oxygen Delivery Method: Circle System Utilized Preoxygenation: Pre-oxygenation with 100% oxygen Induction Type: IV induction and Cricoid Pressure applied Ventilation: Oral airway inserted - appropriate to patient size and Mask ventilation with difficulty Laryngoscope Size: Miller and 2 Grade View: Grade II Tube type: Oral Tube size: 7.5 mm Number of attempts: 2 Airway Equipment and Method: Stylet and Oral airway Placement Confirmation: ETT inserted through vocal cords under direct vision, positive ETCO2 and breath sounds checked- equal and bilateral Tube secured with: Tape Dental Injury: Teeth and Oropharynx as per pre-operative assessment

## 2022-01-06 NOTE — H&P (Signed)
Subjective: The patient is a 61 year old white female who has had a previous L4-5 discectomy.  She has developed recurrent back and leg pain.  She has failed medical management.  She was worked up with a lumbar MRI which demonstrated degenerative disease and recurrent herniated disc at L3-4 and L4-5.  I discussed the various treatment options with her.  She has decided proceed with surgery.  Past Medical History:  Diagnosis Date   Abnormally small mouth    Arthritis    knees   Carpal tunnel syndrome on right 09/2016   Dental crowns present    also dental caps   Family history of adverse reaction to anesthesia    pt's mother and son have hx. of post-op N/V   GERD (gastroesophageal reflux disease)    History of kidney stones    Hx of migraines    Hyperlipidemia    IBS (irritable bowel syndrome)    PONV (postoperative nausea and vomiting)    1 episode only    Past Surgical History:  Procedure Laterality Date   BACK SURGERY     CARPAL TUNNEL RELEASE Right 10/03/2016   Procedure: Right limited open CARPAL TUNNEL RELEASE;  Surgeon: Dominica Severin, MD;  Location: [facility];  Service: Orthopedics;  Laterality: Right;   CESAREAN SECTION     CHOLECYSTECTOMY  05/27/2016   COLONOSCOPY WITH PROPOFOL  04/20/2015   CYSTO     EYE SURGERY     INCISION / DEBRIDEMENT BONE ELBOW Right 06/14/2003   radial head   KNEE ARTHROSCOPY Left 01/22/2010   SALPINGOOPHORECTOMY Left 03/31/2002   STERIOD INJECTION Left 10/03/2016   Procedure: STEROID INJECTION;  Surgeon: Dominica Severin, MD;  Location: [facility];  Service: Orthopedics;  Laterality: Left;   TOTAL ABDOMINAL HYSTERECTOMY  03/31/2002    Allergies  Allergen Reactions   Lipitor [Atorvastatin] Other (See Comments)    MYALGIA    Social History   Tobacco Use   Smoking status: Never   Smokeless tobacco: Never  Substance Use Topics   Alcohol use: No    Alcohol/week: 0.0 standard drinks of alcohol    Family  History  Problem Relation Age of Onset   Anesthesia problems Mother        post-op N/V   Anesthesia problems Other        post-op N/V   Prior to Admission medications   Medication Sig Start Date End Date Taking? Authorizing Provider  aspirin 81 MG tablet Take 81 mg by mouth daily.   Yes [provider]  betamethasone dipropionate 0.05 % cream Apply 1 application. topically daily as needed (eczema). 11/16/19  Yes [provider]  docusate sodium (COLACE) 100 MG capsule Take 100 mg by mouth daily.   Yes [provider]  escitalopram (LEXAPRO) 5 MG tablet Take 5 mg by mouth daily. 07/06/20  Yes [provider]  gabapentin (NEURONTIN) 300 MG capsule TAKE 2 CAPSULES(600 MG) BY MOUTH THREE TIMES DAILY 12/30/21  Yes Mliss Sax, MD  HYDROcodone-acetaminophen [facility]) 5-325 MG tablet May take one nightly prn. 08/06/21  Yes Mliss Sax, MD  loratadine (CLARITIN) 10 MG tablet Take 10 mg by mouth daily.   Yes [provider]  oxyCODONE-acetaminophen (PERCOCET/ROXICET) 5-325 MG tablet Take 1 tablet by mouth every 8 (eight) hours as needed for pain. 11/29/21  Yes [provider]  rosuvastatin (CRESTOR) 10 MG tablet TAKE 1 TABLET (10 MG TOTAL) BY MOUTH DAILY. FOLLOW UP NEEDED 06/17/21  Yes Nadene Rubins  Gaspar GarbeAlfred, MD  carboxymethylcellulose (REFRESH PLUS) 0.5 % SOLN Place 1 drop into both eyes 3 (three) times daily as needed (dry eyes).    [provider]  ketorolac (TORADOL) 10 MG tablet Take 1 tablet (10 mg total) by mouth every 6 (six) hours as needed. Patient not taking: Reported on 12/18/2021 08/06/21   Mliss SaxKremer, William Alfred, MD  pantoprazole (PROTONIX) 40 MG tablet Take 40 mg by mouth daily as needed (indigestion).    [provider]  predniSONE (STERAPRED UNI-PAK 48 TAB) 10 MG (48) TBPK tablet Please instruct a 12 day dose pack Patient not taking: Reported on 08/06/2021 07/12/21   Mliss SaxKremer, William Alfred, MD   rizatriptan (MAXALT) 10 MG tablet Take 10 mg by mouth as needed for migraine. May repeat in 2 hours if needed    [provider]  valACYclovir (VALTREX) 500 MG tablet Take 500 mg by mouth 2 (two) times daily as needed (shingles outbreak).    [provider]     Review of Systems  Positive ROS: As above  All other systems have been reviewed and were otherwise negative with the exception of those mentioned in the HPI and as above.  Objective: Vital signs in last 24 hours: Temp:  [98.2 F (36.8 C)] 98.2 F (36.8 C) (06/12 1008) Pulse Rate:  [71] 71 (06/12 1008) Resp:  [17] 17 (06/12 1008) BP: (149)/(87) 149/87 (06/12 1008) SpO2:  [96 %] 96 % (06/12 1008) Weight:  [95.3 kg] 95.3 kg (06/12 1008) Estimated body mass index is 38.41 kg/m as calculated from the following:   Height as of this encounter: 5\' 2"  (1.575 m).   Weight as of this encounter: 95.3 kg.   General Appearance: Alert Head: Normocephalic, without obvious abnormality, atraumatic Eyes: PERRL, conjunctiva/corneas clear, EOM's intact,    Ears: Normal  Throat: Normal  Neck: Supple, Back: Her lumbar incision is well-healed. Lungs: Clear to auscultation bilaterally, respirations unlabored Heart: Regular rate and rhythm, no murmur, rub or gallop Abdomen: Soft, non-tender Extremities: Extremities normal, atraumatic, no cyanosis or edema Skin: unremarkable  NEUROLOGIC:   Mental status: alert and oriented,Motor Exam - grossly normal Sensory Exam - grossly normal Reflexes:  Coordination - grossly normal Gait - grossly normal Balance - grossly normal Cranial Nerves: I: smell Not tested  II: visual acuity  OS: Normal  OD: Normal   II: visual fields Full to confrontation  II: pupils Equal, round, reactive to light  III,VII: ptosis None  III,IV,VI: extraocular muscles  Full ROM  V: mastication Normal  V: facial light touch sensation  Normal  V,VII: corneal reflex  Present  VII: facial muscle  function - upper  Normal  VII: facial muscle function - lower Normal  VIII: hearing Not tested  IX: soft palate elevation  Normal  IX,X: gag reflex Present  XI: trapezius strength  5/5  XI: sternocleidomastoid strength 5/5  XI: neck flexion strength  5/5  XII: tongue strength  Normal    Data Review Lab Results  Component Value Date   WBC 7.5 12/30/2021   HGB 13.4 12/30/2021   HCT 42.6 12/30/2021   MCV 87.8 12/30/2021   PLT 265 12/30/2021   Lab Results  Component Value Date   NA 137 12/30/2021   K 3.9 12/30/2021   CL 102 12/30/2021   CO2 28 12/30/2021   BUN 8 12/30/2021   CREATININE 0.68 12/30/2021   GLUCOSE 97 12/30/2021   Lab Results  Component Value Date   INR 1.1 04/19/2007  Assessment/Plan: L3-4 and L4-5 degenerative disease, spinal stenosis, recurrent disc herniation, lumbago, lumbar radiculopathy, neurogenic claudication: I have discussed the situation with the patient.  I have reviewed her imaging studies with her and pointed out the abnormalities.  We have discussed the various treatment options including surgery.  I described the surgical treatment option of an L3-4 and L4-5 decompression, instrumentation and fusion.  I have shown her surgical models.  I have given her a surgical pamphlet.  We have discussed the risk, benefits, alternatives, expected postoperative course, and likelihood of achieving our goals with surgery.  I have answered all her questions.  She has decided proceed with surgery.   Cristi Loron 01/06/2022 12:33 PM

## 2022-01-06 NOTE — Anesthesia Preprocedure Evaluation (Signed)
Anesthesia Evaluation  Patient identified by MRN, date of birth, ID band Patient awake    Reviewed: Allergy & Precautions, NPO status , Patient's Chart, lab work & pertinent test results  History of Anesthesia Complications (+) PONV and history of anesthetic complications  Airway Mallampati: II  TM Distance: >3 FB Neck ROM: Full    Dental no notable dental hx. (+) Caps   Pulmonary neg pulmonary ROS,    Pulmonary exam normal breath sounds clear to auscultation       Cardiovascular negative cardio ROS Normal cardiovascular exam Rhythm:Regular Rate:Normal     Neuro/Psych  Headaches, negative psych ROS   GI/Hepatic Neg liver ROS, GERD  ,  Endo/Other  negative endocrine ROS  Renal/GU negative Renal ROS  negative genitourinary   Musculoskeletal  (+) Arthritis , Osteoarthritis,    Abdominal (+) + obese,   Peds negative pediatric ROS (+)  Hematology negative hematology ROS (+)   Anesthesia Other Findings   Reproductive/Obstetrics negative OB ROS                             Anesthesia Physical  Anesthesia Plan  ASA: II  Anesthesia Plan: General   Post-op Pain Management: Dilaudid IV, Ketamine IV*, Lidocaine infusion* and Ofirmev IV (intra-op)*   Induction: Intravenous  PONV Risk Score and Plan: 4 or greater and Ondansetron, Dexamethasone, Midazolam, Droperidol and Treatment may vary due to age or medical condition  Airway Management Planned: Oral ETT  Additional Equipment:   Intra-op Plan:   Post-operative Plan: Extubation in OR  Informed Consent: I have reviewed the patients History and Physical, chart, labs and discussed the procedure including the risks, benefits and alternatives for the proposed anesthesia with the patient or authorized representative who has indicated his/her understanding and acceptance.     Dental advisory given  Plan Discussed with: CRNA  Anesthesia  Plan Comments:         Anesthesia Quick Evaluation

## 2022-01-06 NOTE — Op Note (Signed)
Brief history: The patient is a 61 year old white female whose had a previous lumbar discectomy.  She has developed recurrent back and left greater than right leg pain consistent with a lumbar radiculopathy.  She failed medical management.  She was worked up with a lumbar MRI which demonstrated a recurrent herniated disc at L4-5 and degenerative disease at L3-4.  I discussed the various treatment options with her.  She has decided proceed with surgery.  Preoperative diagnosis: L4-5 recurrent lumbar herniated disc, L3-4 and L4-5 degenerative disc disease,  lumbago; lumbar radiculopathy; neurogenic claudication  Postoperative diagnosis the same  Procedure: Bilateral L3-4 and L4-5 laminotomy/foraminotomies/medial facetectomy to decompress the bilateral L3, L4 and L5 nerve roots(the work required to do this was in addition to the work required to do the posterior lumbar interbody fusion because of the patient's current herniated disc, facet arthropathy. Etc. requiring a wide decompression of the nerve roots.);  Left L3-4 and L4-5 transforaminal lumbar interbody fusion with local morselized autograft bone and Zimmer DBM; insertion of interbody prosthesis at L3-4 and L4-5 (globus peek expandable interbody prosthesis); posterior segmental instrumentation from L3 to L5 with globus titanium pedicle screws and rods; posterior lateral arthrodesis at L3-4 and L4-5 with local morselized autograft bone and Zimmer DBM.  Surgeon: Dr. Delma Officer  Asst.: Hildred Priest, NP  Anesthesia: Gen. endotracheal  Estimated blood loss: 200 cc  Drains: None  Complications: None  Description of procedure: The patient was brought to the operating room by the anesthesia team. General endotracheal anesthesia was induced. The patient was turned to the prone position on the Wilson frame. The patient's lumbosacral region was then prepared with Betadine scrub and Betadine solution. Sterile drapes were applied.  I then injected  the area to be incised with Marcaine with epinephrine solution. I then used the scalpel to make a linear midline incision over the L3-4 and L4-5 interspace, incising through the old surgical scar. I then used electrocautery to perform a bilateral subperiosteal dissection exposing the spinous process and lamina of L3, L4 and L5. We then obtained intraoperative radiograph to confirm our location. We then inserted the Verstrac retractor to provide exposure.  I began the decompression by using the high speed drill to perform laminotomies at L3-4 and L4-5 bilaterally. We then used the Kerrison punches to widen the laminotomy and removed the ligamentum flavum at L3-4 and L4-5 and the epidural scar tissue at L4-5 on the left. We used the Kerrison punches to remove the medial facets at L3-4 and L4-5, we removed the facets on the left. We performed wide foraminotomies about the bilateral L3, L4 and L5 nerve roots completing the decompression.  We now turned our attention to the posterior lumbar interbody fusion. I used a scalpel to incise the intervertebral disc at L3-4 and L4-5 bilaterally. I then performed a partial intervertebral discectomy at L3-4 and L4-5 bilaterally using the pituitary forceps. We prepared the vertebral endplates at L3-4 and L4-5 bilaterally for the fusion by removing the soft tissues with the curettes. We then used the trial spacers to pick the appropriate sized interbody prosthesis. We prefilled his prosthesis with a combination of local morselized autograft bone that we obtained during the decompression as well as Zimmer DBM. We inserted the prefilled prosthesis into the interspace at L3-4 and L4-5 from the left, we then turned and expanded the prosthesis. There was a good snug fit of the prosthesis in the interspace. We then filled and the remainder of the intervertebral disc space with local morselized  autograft bone and Zimmer DBM. This completed the posterior lumbar interbody arthrodesis.   During the decompression and insertion of the prosthesis the assistant protected the thecal sac and nerve roots with the D'Errico retractor.  We now turned attention to the instrumentation. Under fluoroscopic guidance we cannulated the bilateral L3, L4 and L5 pedicles with the bone probe. We then removed the bone probe. We then tapped the pedicle with a 6.5 millimeter tap. We then removed the tap. We probed inside the tapped pedicle with a ball probe to rule out cortical breaches. We then inserted a 7.5 x 50 millimeter pedicle screw into the L3, L4 and L5 pedicles bilaterally under fluoroscopic guidance. We then palpated along the medial aspect of the pedicles to rule out cortical breaches. There were none. The nerve roots were not injured. We then connected the unilateral pedicle screws with a lordotic rod. We compressed the construct and secured the rod in place with the caps. We then tightened the caps appropriately. This completed the instrumentation from L3-L5 bilaterally.  We now turned our attention to the posterior lateral arthrodesis at L3-4 and L4-5. We used the high-speed drill to decorticate the remainder of the facets, pars, transverse process at L3-4 and L4-5. We then applied a combination of local morselized autograft bone and Zimmer DBM over these decorticated posterior lateral structures. This completed the posterior lateral arthrodesis.  We then obtained hemostasis using bipolar electrocautery. We irrigated the wound out with saline solution. We inspected the thecal sac and nerve roots and noted they were well decompressed. We then removed the retractor.  We injected Exparel . We reapproximated patient's thoracolumbar fascia with interrupted #1 Vicryl suture. We reapproximated patient's subcutaneous tissue with interrupted 2-0 Vicryl suture. The reapproximated patient's skin with Steri-Strips and benzoin. The wound was then coated with bacitracin ointment. A sterile dressing was applied. The  drapes were removed. The patient was subsequently returned to the supine position where they were extubated by the anesthesia team. He was then transported to the post anesthesia care unit in stable condition. All sponge instrument and needle counts were reportedly correct at the end of this case.

## 2022-01-07 ENCOUNTER — Other Ambulatory Visit: Payer: Self-pay

## 2022-01-07 DIAGNOSIS — M5116 Intervertebral disc disorders with radiculopathy, lumbar region: Secondary | ICD-10-CM | POA: Diagnosis not present

## 2022-01-07 MED ORDER — OXYCODONE-ACETAMINOPHEN 5-325 MG PO TABS
1.0000 | ORAL_TABLET | ORAL | 0 refills | Status: DC | PRN
Start: 2022-01-07 — End: 2022-11-20

## 2022-01-07 MED ORDER — CYCLOBENZAPRINE HCL 10 MG PO TABS: 10.0000 mg | ORAL_TABLET | Freq: Three times a day (TID) | ORAL | 0 refills | Status: DC | PRN

## 2022-01-07 MED ORDER — THROMBIN 5000 UNITS EX SOLR: OROMUCOSAL | Status: DC | PRN

## 2022-01-07 MED ORDER — OXYCODONE-ACETAMINOPHEN 5-325 MG PO TABS: 1.0000 | ORAL_TABLET | ORAL | Status: DC | PRN

## 2022-01-07 NOTE — Plan of Care (Signed)
Pt doing well. Pt and family given D/C instructions with verbal understanding. Rx's were sent to the pharmacy by MD. Pt's incision is clean and dry with no sign of infection. Pt's IV was removed prior to D/C. Pt D/C'd home via wheelchair per MD order. Pt is stable @ D/C and has no other needs at this time. Taevon Aschoff, RN  

## 2022-01-07 NOTE — Progress Notes (Signed)
PT Cancellation Note  Patient Details Name: Natalie [name] MRN: [id number] DOB: 12/10/1960   Cancelled Treatment:    Reason Eval/Treat Not Completed: PT screened, no needs identified, will sign off Per OT, pt with no skilled PT needs. If needs change, please re-consult.   Cindee Salt, DPT  Acute Rehabilitation Services  Office: [phone]    Lehman Prom 01/07/2022, 9:41 AM

## 2022-01-07 NOTE — Evaluation (Signed)
Occupational Therapy Evaluation Patient Details Name: Natalie [name] MRN: [id number] DOB: 1961-02-03 Today's Date: 01/07/2022   History of Present Illness Pt is a 61 y/o F presenting with degenerative disease and recurrent herniated disc at L3-4, L4-5, s/p bilateral L3-4, L4-5 laminectomies, foraminectomies, medial facetectomy to decompress bilateral L3, L4, L5, nerve roots. PMH includes arthritis, carpal tunnel disease, HLD, IBS, and GERD.   Clinical Impression   Pt independent at baseline with ADLs and functional mobility, lives with spouse and mother is close by. Pt currently mod I - min guard for ADLs, supervision for bed mobility, and min guard for transfers without AD. Pt educated on compensatory strategies for ADLs, brace wear schedule, and precautions. Pt verbalizes and demonstrates understanding, adheres to precautions during session. Pt able to ambulate hallway distance and complete stair navigation min guard A without AD. Educated pt on use of BSC as shower seat for home. Pt presenting with impairments listed below, will follow acutely. Recommend d/c home with family assistance.     Recommendations for follow up therapy are one component of a multi-disciplinary discharge planning process, led by the attending physician.  Recommendations may be updated based on patient status, additional functional criteria and insurance authorization.   Follow Up Recommendations  No OT follow up    Assistance Recommended at Discharge Set up Supervision/Assistance  Patient can return home with the following A little help with bathing/dressing/bathroom;Assistance with cooking/housework;Assist for transportation    Functional Status Assessment  Patient has had a recent decline in their functional status and demonstrates the ability to make significant improvements in function in a reasonable and predictable amount of time.  Equipment Recommendations  None recommended by OT;Other (comment) (pt has all  needed DME)    Recommendations for Other Services       Precautions / Restrictions Precautions Precautions: Back Precaution Booklet Issued: Yes (comment) Precaution Comments: educated pt on 3/3 back precautions Required Braces or Orthoses: Spinal Brace Spinal Brace: Lumbar corset;Applied in sitting position Restrictions Weight Bearing Restrictions: No      Mobility Bed Mobility Overal bed mobility: Needs Assistance Bed Mobility: Sidelying to Sit   Sidelying to sit: Supervision       General bed mobility comments: log rolling technique    Transfers Overall transfer level: Needs assistance Equipment used: None Transfers: Sit to/from Stand Sit to Stand: Min guard                  Balance Overall balance assessment: Mild deficits observed, not formally tested                                         ADL either performed or assessed with clinical judgement   ADL Overall ADL's : Needs assistance/impaired Eating/Feeding: Modified independent;Sitting   Grooming: Modified independent;Sitting   Upper Body Bathing: Supervision/ safety;Sitting   Lower Body Bathing: Supervison/ safety;Sitting/lateral leans;Sit to/from stand   Upper Body Dressing : Supervision/safety;Standing   Lower Body Dressing: Supervision/safety;Sitting/lateral leans;Sit to/from stand   Toilet Transfer: Solicitor;Ambulation   Toileting- Clothing Manipulation and Hygiene: Supervision/safety;Sitting/lateral lean       Functional mobility during ADLs: Min guard       Vision Baseline Vision/History: 1 Wears glasses Ability to See in Adequate Light: 0 Adequate Patient Visual Report: No change from baseline Vision Assessment?: No apparent visual deficits     Perception     Praxis  Pertinent Vitals/Pain Pain Assessment Pain Assessment: Faces Pain Score: 6  Faces Pain Scale: Hurts even more Pain Location: low back Pain Descriptors / Indicators:  Discomfort, Constant Pain Intervention(s): Limited activity within patient's tolerance     Hand Dominance     Extremity/Trunk Assessment Upper Extremity Assessment Upper Extremity Assessment: Overall WFL for tasks assessed   Lower Extremity Assessment Lower Extremity Assessment: Overall WFL for tasks assessed (reports knee weakness, awaiting knee replacements)   Cervical / Trunk Assessment Cervical / Trunk Assessment: Normal   Communication Communication Communication: No difficulties   Cognition Arousal/Alertness: Awake/alert Behavior During Therapy: WFL for tasks assessed/performed Overall Cognitive Status: Within Functional Limits for tasks assessed                                       General Comments  VSS On RA    Exercises     Shoulder Instructions      Home Living Family/patient expects to be discharged to:: Private residence Living Arrangements: Spouse/significant other Available Help at Discharge: Family;Available PRN/intermittently Type of Home: House Home Access: Stairs to enter Entergy CorporationEntrance Stairs-Number of Steps: 3 Entrance Stairs-Rails: Left Home Layout: Two level;Able to live on main level with bedroom/bathroom     Bathroom Shower/Tub: Producer, television/film/videoWalk-in shower   Bathroom Toilet: Handicapped height     Home Equipment: Agricultural consultantolling Walker (2 wheels);Grab bars - toilet;Grab bars - tub/shower;BSC/3in1;Cane - single point   Additional Comments: reports her mother also lives nearby      Prior Functioning/Environment Prior Level of Function : Independent/Modified Independent             Mobility Comments: does not use AD regularly, has rollator, uses rarely when she gets up in the middle of the night to use bathroom ADLs Comments: does IADLs        OT Problem List: Decreased range of motion;Decreased activity tolerance;Impaired balance (sitting and/or standing)      OT Treatment/Interventions: Therapeutic exercise;Self-care/ADL training;DME  and/or AE instruction;Therapeutic activities;Balance training;Energy conservation    OT Goals(Current goals can be found in the care plan section) Acute Rehab OT Goals Patient Stated Goal: none stated OT Goal Formulation: With patient Time For Goal Achievement: 01/21/22 Potential to Achieve Goals: Good ADL Goals Pt Will Perform Upper Body Dressing: Independently;standing;sitting Pt Will Perform Lower Body Dressing: Independently;sitting/lateral leans;sit to/from stand Pt Will Perform Tub/Shower Transfer: Shower transfer;Independently;ambulating;3 in 1  OT Frequency: Min 2X/week    Co-evaluation              AM-PAC OT "6 Clicks" Daily Activity     Outcome Measure Help from another person eating meals?: None Help from another person taking care of personal grooming?: None Help from another person toileting, which includes using toliet, bedpan, or urinal?: A Little Help from another person bathing (including washing, rinsing, drying)?: A Little Help from another person to put on and taking off regular upper body clothing?: None Help from another person to put on and taking off regular lower body clothing?: A Little 6 Click Score: 21   End of Session Equipment Utilized During Treatment: Gait belt;Back brace Nurse Communication: Mobility status  Activity Tolerance: Patient tolerated treatment well Patient left: in bed;with call bell/phone within reach  OT Visit Diagnosis: Muscle weakness (generalized) (M62.81)                Time: [id number] OT Time Calculation (min): 22 min Charges:  OT General  Charges $OT Visit: 1 Visit OT Evaluation $OT Eval Low Complexity: 1 Low  Alfonzo Beers, OTD, OTR/L Acute Rehab [phone]) [phone]  Mayer Masker 01/07/2022, 9:46 AM

## 2022-01-07 NOTE — Progress Notes (Signed)
Orthopedic Tech Progress Note Patient Details:  Natalie [name] Aug 28, 1960 [id number]  Patient ID: [name], female   DOB: June 15, 1961, 61 y.o.   MRN: [id number] Patient already has a brace that they want to use according to RN. Trinna Post 01/07/2022, 6:29 AM

## 2022-01-07 NOTE — TOC Transition Note (Signed)
Transition of Care [facility]) - CM/SW Discharge Note   Patient Details  Name: Natalie [name] MRN: [id number] Date of Birth: [birth year]  [facility]) CM/SW Contact:  Kermit Balo, RN Phone Number: 01/07/2022, 8:43 AM   Clinical Narrative:    Patient is discharging home with home health services that were pre-arranged through the MD office. Information on the AVS.  Bedside RN will obtain needed DME.  Pt has transport home.    Final next level of care: Home w Home Health Services Barriers to Discharge: No Barriers Identified   Patient Goals and CMS Choice        Discharge Placement                       Discharge Plan and Services                          HH Arranged: PT, OT HH Agency: [facility]        Social Determinants of Health (SDOH) Interventions     Readmission Risk Interventions     No data to display

## 2022-01-07 NOTE — Anesthesia Postprocedure Evaluation (Signed)
Anesthesia Post Note  Patient: Natalie [name]  Procedure(s) Performed: PLIF - Posterior Lateral and Interbody fusion, IP, Posterion instrumentation - L3-L4 - L4-L5 (Back)     Patient location during evaluation: Other Anesthesia Type: General Level of consciousness: awake and alert Pain management: pain level controlled Vital Signs Assessment: post-procedure vital signs reviewed and stable Respiratory status: spontaneous breathing, nonlabored ventilation and respiratory function stable Cardiovascular status: blood pressure returned to baseline and stable Postop Assessment: no apparent nausea or vomiting Anesthetic complications: no   No notable events documented.  Last Vitals:  Vitals:   01/07/22 0356 01/07/22 0813  BP: 133/72 112/61  Pulse: 89 76  Resp: 18 18  Temp: 37.4 C 37.6 C  SpO2: 96% 96%    Last Pain:  Vitals:   01/07/22 0813  TempSrc: Oral  PainSc:                  Loyce Flaming,W. EDMOND

## 2022-01-07 NOTE — Discharge Summary (Signed)
Physician Discharge Summary  Patient ID: Natalie [name] MRN: [id number] DOB/AGE: 01/11/1961 61 y.o.  Admit date: 01/06/2022 Discharge date: 01/07/2022  Admission Diagnoses: Recurrent lumbar herniated disc, lumbar degenerative disease, lumbago, lumbar radiculopathy  Discharge Diagnoses: The same Principal Problem:   Recurrent herniation of lumbar disc   Discharged Condition: good  Hospital Course: I performed an L3-4 and L4-5 decompression, instrumentation and fusion on the patient on 01/06/2022.  The surgery went well.  The patient's postoperative course was unremarkable.  On postoperative day #1 she requested discharge to home.  She was given verbal and written discharge instructions.  All her questions were answered.  Consults: PT, OT, care management Significant Diagnostic Studies: None Treatments: L3-4 and L4-5 decompression, instrumentation and fusion. Discharge Exam: Blood pressure 133/72, pulse 89, temperature 99.4 F (37.4 C), temperature source Oral, resp. rate 18, height 5\' 2"  (1.575 m), weight 95.3 kg, SpO2 96 %. The patient is alert and pleasant.  She looks well.  Her dressing is clean and dry.  Her strength is normal.  Disposition: Home  Discharge Instructions     Call MD for:  difficulty breathing, headache or visual disturbances   Complete by: As directed    Call MD for:  extreme fatigue   Complete by: As directed    Call MD for:  hives   Complete by: As directed    Call MD for:  persistant dizziness or light-headedness   Complete by: As directed    Call MD for:  persistant nausea and vomiting   Complete by: As directed    Call MD for:  redness, tenderness, or signs of infection (pain, swelling, redness, odor or green/yellow discharge around incision site)   Complete by: As directed    Call MD for:  severe uncontrolled pain   Complete by: As directed    Call MD for:  temperature >100.4   Complete by: As directed    Diet - low sodium heart healthy   Complete  by: As directed    Discharge instructions   Complete by: As directed    Call [phone] for a followup appointment. Take a stool softener while you are using pain medications.   Driving Restrictions   Complete by: As directed    Do not drive for 2 weeks.   Increase activity slowly   Complete by: As directed    Lifting restrictions   Complete by: As directed    Do not lift more than 5 pounds. No excessive bending or twisting.   May shower / Bathe   Complete by: As directed    Remove the dressing for 3 days after surgery.  You may shower, but leave the incision alone.   Remove dressing in 48 hours   Complete by: As directed       Allergies as of 01/07/2022       Reactions   Lipitor [atorvastatin] Other (See Comments)   MYALGIA        Medication List     STOP taking these medications    HYDROcodone-acetaminophen 5-325 MG tablet Commonly known as: Norco   ketorolac 10 MG tablet Commonly known as: TORADOL   predniSONE 10 MG (48) Tbpk tablet Commonly known as: STERAPRED UNI-PAK 48 TAB       TAKE these medications    aspirin 81 MG tablet Take 81 mg by mouth daily.   betamethasone dipropionate 0.05 % cream Apply 1 application. topically daily as needed (eczema).   carboxymethylcellulose 0.5 % Soln Commonly known as:  REFRESH PLUS Place 1 drop into both eyes 3 (three) times daily as needed (dry eyes).   cyclobenzaprine 10 MG tablet Commonly known as: FLEXERIL Take 1 tablet (10 mg total) by mouth 3 (three) times daily as needed for muscle spasms.   docusate sodium 100 MG capsule Commonly known as: COLACE Take 100 mg by mouth daily.   escitalopram 5 MG tablet Commonly known as: LEXAPRO Take 5 mg by mouth daily.   gabapentin 300 MG capsule Commonly known as: NEURONTIN TAKE 2 CAPSULES(600 MG) BY MOUTH THREE TIMES DAILY   loratadine 10 MG tablet Commonly known as: CLARITIN Take 10 mg by mouth daily.   oxyCODONE-acetaminophen 5-325 MG tablet Commonly  known as: PERCOCET/ROXICET Take 1-2 tablets by mouth every 4 (four) hours as needed for moderate pain. What changed:  how much to take when to take this reasons to take this   pantoprazole 40 MG tablet Commonly known as: PROTONIX Take 40 mg by mouth daily as needed (indigestion).   rizatriptan 10 MG tablet Commonly known as: MAXALT Take 10 mg by mouth as needed for migraine. May repeat in 2 hours if needed   rosuvastatin 10 MG tablet Commonly known as: CRESTOR TAKE 1 TABLET (10 MG TOTAL) BY MOUTH DAILY. FOLLOW UP NEEDED   valACYclovir 500 MG tablet Commonly known as: VALTREX Take 500 mg by mouth 2 (two) times daily as needed (shingles outbreak).         Signed: Ophelia Charter 01/07/2022, 7:50 AM

## 2022-01-09 MED FILL — Sodium Chloride IV Soln 0.9%: INTRAVENOUS | Qty: 1000 | Status: AC

## 2022-01-09 MED FILL — Heparin Sodium (Porcine) Inj 1000 Unit/ML: INTRAMUSCULAR | Qty: 30 | Status: AC

## 2022-02-21 ENCOUNTER — Emergency Department (HOSPITAL_BASED_OUTPATIENT_CLINIC_OR_DEPARTMENT_OTHER)
Admission: EM | Admit: 2022-02-21 | Discharge: 2022-02-21 | Disposition: A | Discharge status: Home / Self Care | Payer: 59 | Attending: Emergency Medicine | Admitting: Emergency Medicine

## 2022-02-21 ENCOUNTER — Telehealth: Payer: Self-pay | Admitting: Family Medicine

## 2022-02-21 ENCOUNTER — Encounter (HOSPITAL_BASED_OUTPATIENT_CLINIC_OR_DEPARTMENT_OTHER): Payer: Self-pay

## 2022-02-21 ENCOUNTER — Other Ambulatory Visit: Payer: Self-pay

## 2022-02-21 ENCOUNTER — Emergency Department (HOSPITAL_BASED_OUTPATIENT_CLINIC_OR_DEPARTMENT_OTHER): Payer: 59

## 2022-02-21 DIAGNOSIS — R110 Nausea: Secondary | ICD-10-CM | POA: Insufficient documentation

## 2022-02-21 DIAGNOSIS — R42 Dizziness and giddiness: Secondary | ICD-10-CM | POA: Insufficient documentation

## 2022-02-21 DIAGNOSIS — R519 Headache, unspecified: Secondary | ICD-10-CM | POA: Diagnosis not present

## 2022-02-21 DIAGNOSIS — Z7982 Long term (current) use of aspirin: Secondary | ICD-10-CM | POA: Diagnosis not present

## 2022-02-21 LAB — COMPREHENSIVE METABOLIC PANEL
ALT: 9 U/L (ref 0–44)
AST: 12 U/L — ABNORMAL LOW (ref 15–41)
Albumin: 4.4 g/dL (ref 3.5–5.0)
Alkaline Phosphatase: 91 U/L (ref 38–126)
Anion gap: 7 (ref 5–15)
BUN: 10 mg/dL (ref 8–23)
CO2: 28 mmol/L (ref 22–32)
Calcium: 9.2 mg/dL (ref 8.9–10.3)
Chloride: 101 mmol/L (ref 98–111)
Creatinine, Ser: 0.69 mg/dL (ref 0.44–1.00)
GFR, Estimated: >60 mL/min (ref >60)
Glucose, Bld: 97 mg/dL (ref 70–99)
Potassium: 4.1 mmol/L (ref 3.5–5.1)
Sodium: 136 mmol/L (ref 135–145)
Total Bilirubin: 0.5 mg/dL (ref 0.3–1.2)
Total Protein: 7.3 g/dL (ref 6.5–8.1)

## 2022-02-21 LAB — CBC WITH DIFFERENTIAL/PLATELET
Abs Immature Granulocytes: 0.01 10*3/uL (ref 0.00–0.07)
Basophils Absolute: 0.1 10*3/uL (ref 0.0–0.1)
Basophils Relative: 2 %
Eosinophils Absolute: 0.3 10*3/uL (ref 0.0–0.5)
Eosinophils Relative: 5 %
HCT: 39.1 % (ref 36.0–46.0)
Hemoglobin: 12.7 g/dL (ref 12.0–15.0)
Immature Granulocytes: 0 %
Lymphocytes Relative: 25 %
Lymphs Abs: 1.7 10*3/uL (ref 0.7–4.0)
MCH: 27.6 pg (ref 26.0–34.0)
MCHC: 32.5 g/dL (ref 30.0–36.0)
MCV: 85 fL (ref 80.0–100.0)
Monocytes Absolute: 0.4 10*3/uL (ref 0.1–1.0)
Monocytes Relative: 5 %
Neutro Abs: 4.2 10*3/uL (ref 1.7–7.7)
Neutrophils Relative %: 63 %
Platelets: 295 10*3/uL (ref 150–400)
RBC: 4.6 MIL/uL (ref 3.87–5.11)
RDW: 13.2 % (ref 11.5–15.5)
WBC: 6.7 10*3/uL (ref 4.0–10.5)
nRBC: 0 % (ref 0.0–0.2)

## 2022-02-21 MED ORDER — MECLIZINE HCL 25 MG PO TABS
25.0000 mg | ORAL_TABLET | Freq: Once | ORAL | Status: AC
  Administered 2022-02-21: 25 mg via ORAL
  Filled 2022-02-21: qty 1

## 2022-02-21 MED ORDER — MECLIZINE HCL 25 MG PO TABS: 25.0000 mg | ORAL_TABLET | Freq: Three times a day (TID) | ORAL | 0 refills | Status: DC | PRN

## 2022-02-21 MED ORDER — METOCLOPRAMIDE HCL 5 MG/ML IJ SOLN
5.0000 mg | Freq: Once | INTRAMUSCULAR | Status: AC
  Administered 2022-02-21: 5 mg via INTRAVENOUS
  Filled 2022-02-21: qty 2

## 2022-02-21 MED ORDER — ACETAMINOPHEN 325 MG PO TABS
650.0000 mg | ORAL_TABLET | Freq: Once | ORAL | Status: AC
  Administered 2022-02-21: 650 mg via ORAL
  Filled 2022-02-21: qty 2

## 2022-02-21 NOTE — ED Triage Notes (Signed)
Pt reports she woke up at 0830 and when she got she was dizzy headache, and nauseated . Pt rolled to her side  in the bed and became dizzy. Recent back surgery

## 2022-02-21 NOTE — Discharge Instructions (Signed)
Follow-up with your primary care doctor.  Take the meclizine as needed.  If you develop any sort of numbness, weakness, speech or vision change or significant worsening of the dizziness, inability to walk, come back to ER for reassessment.

## 2022-02-21 NOTE — Telephone Encounter (Signed)
Caller Name: Kaylyn Garrow Call back phone #: [phone]  Reason for Call: Pt believed she was dealing with vertigo. She Couldn't get out of bed, I sent her to nurse triage. She was hoping to get prescribed something.

## 2022-02-21 NOTE — ED Notes (Signed)
Dc instructions reviewed with pt no questions or concerns at this time will follow up with pcp 

## 2022-02-21 NOTE — ED Notes (Signed)
States she awoke this morning with "severe dizziness", went to be last night feeling normal.  BEFAST / VAN Negative No nystagmus noted at this time NIH is 0

## 2022-02-21 NOTE — ED Notes (Signed)
VS immediately obtained upon arrival to room, placed on cont cardiac monitoring, safety measures in place, husband at bedside

## 2022-02-21 NOTE — ED Provider Notes (Signed)
[facility] EMERGENCY DEPARTMENT Provider Note   CSN: [id number] Arrival date & time: 02/21/22  5102     History  Chief Complaint  Patient presents with   Dizziness    Natalie [name] is a 61 y.o. female.  Presenting to the emergency department due to concern for dizziness.  Patient reports that she woke up this morning and felt dizzy, dizziness was worse with sudden movements, improved with rest.  Later she developed headache, headache is currently mild, frontal, nonradiating.  Also some nausea but no vomiting.  She does have history of migraines, this headache was not worse than prior migraines but has not had a migraine recently.  Denies prior history of dizziness or vertigo.  Did endorse room spinning sensation earlier today.  Currently at rest symptoms are okay. No ear pain, no sinus pain.   HPI     Home Medications Prior to Admission medications   Medication Sig Start Date End Date Taking? Authorizing Provider  meclizine (ANTIVERT) 25 MG tablet Take 1 tablet (25 mg total) by mouth 3 (three) times daily as needed for dizziness. 02/21/22  Yes Milagros Loll, MD  aspirin 81 MG tablet Take 81 mg by mouth daily.    [provider]  betamethasone dipropionate 0.05 % cream Apply 1 application. topically daily as needed (eczema). 11/16/19   [provider]  carboxymethylcellulose (REFRESH PLUS) 0.5 % SOLN Place 1 drop into both eyes 3 (three) times daily as needed (dry eyes).    [provider]  cyclobenzaprine (FLEXERIL) 10 MG tablet Take 1 tablet (10 mg total) by mouth 3 (three) times daily as needed for muscle spasms. 01/07/22   Tressie Stalker, MD  docusate sodium (COLACE) 100 MG capsule Take 100 mg by mouth daily.    [provider]  escitalopram (LEXAPRO) 5 MG tablet Take 5 mg by mouth daily. 07/06/20   [provider]  gabapentin (NEURONTIN) 300 MG capsule TAKE 2 CAPSULES(600 MG) BY MOUTH THREE TIMES DAILY 12/30/21   Mliss Sax, MD  loratadine (CLARITIN) 10 MG tablet Take 10 mg by mouth daily.    [provider]  oxyCODONE-acetaminophen (PERCOCET/ROXICET) 5-325 MG tablet Take 1-2 tablets by mouth every 4 (four) hours as needed for moderate pain. 01/07/22   Tressie Stalker, MD  pantoprazole (PROTONIX) 40 MG tablet Take 40 mg by mouth daily as needed (indigestion).    [provider]  rizatriptan (MAXALT) 10 MG tablet Take 10 mg by mouth as needed for migraine. May repeat in 2 hours if needed    [provider]  rosuvastatin (CRESTOR) 10 MG tablet TAKE 1 TABLET (10 MG TOTAL) BY MOUTH DAILY. FOLLOW UP NEEDED 06/17/21   Mliss Sax, MD  valACYclovir (VALTREX) 500 MG tablet Take 500 mg by mouth 2 (two) times daily as needed (shingles outbreak).    [provider]      Allergies    Lipitor [atorvastatin]    Review of Systems   Review of Systems  Constitutional:  Negative for chills and fever.  HENT:  Negative for ear pain and sore throat.   Eyes:  Negative for pain and visual disturbance.  Respiratory:  Negative for cough and shortness of breath.   Cardiovascular:  Negative for chest pain and palpitations.  Gastrointestinal:  Positive for nausea. Negative for abdominal pain and vomiting.  Genitourinary:  Negative for dysuria and hematuria.  Musculoskeletal:  Negative for arthralgias and back pain.  Skin:  Negative for color change  and rash.  Neurological:  Positive for dizziness and headaches. Negative for seizures and syncope.  All other systems reviewed and are negative.   Physical Exam Updated Vital Signs BP 133/85 (BP Location: Right Arm)   Pulse 68   Temp 98.5 F (36.9 C)   Resp 12   Ht 5\' 1"  (1.549 m)   Wt 90.2 kg   SpO2 98%   BMI 37.57 kg/m  Physical Exam Vitals and nursing note reviewed.  Constitutional:      General: She is not in acute distress.    Appearance: She is well-developed.  HENT:     Head: Normocephalic and atraumatic.   Eyes:     Conjunctiva/sclera: Conjunctivae normal.  Cardiovascular:     Rate and Rhythm: Normal rate and regular rhythm.     Heart sounds: No murmur heard. Pulmonary:     Effort: Pulmonary effort is normal. No respiratory distress.     Breath sounds: Normal breath sounds.  Abdominal:     Palpations: Abdomen is soft.     Tenderness: There is no abdominal tenderness.  Musculoskeletal:        General: No swelling.     Cervical back: Neck supple.  Skin:    General: Skin is warm and dry.     Capillary Refill: Capillary refill takes less than 2 seconds.  Neurological:     Mental Status: She is alert.     Comments: AAOx3 CN 2-12 intact, speech clear visual fields intact 5/5 strength in b/l UE and LE Sensation to light touch intact in b/l UE and LE Normal FNF  Psychiatric:        Mood and Affect: Mood normal.     ED Results / Procedures / Treatments   Labs (all labs ordered are listed, but only abnormal results are displayed) Labs Reviewed  COMPREHENSIVE METABOLIC PANEL - Abnormal; Notable for the following components:      Result Value   AST 12 (*)    All other components within normal limits  CBC WITH DIFFERENTIAL/PLATELET    EKG None  Radiology CT Head Wo Contrast  Result Date: 02/21/2022 CLINICAL DATA:  Headache, severe., dizziness.  Nausea. EXAM: CT HEAD WITHOUT CONTRAST TECHNIQUE: Contiguous axial images were obtained from the base of the skull through the vertex without intravenous contrast. RADIATION DOSE REDUCTION: This exam was performed according to the departmental dose-optimization program which includes automated exposure control, adjustment of the mA and/or kV according to patient size and/or use of iterative reconstruction technique. COMPARISON:  None Available. FINDINGS: Brain: No evidence of acute infarction, hemorrhage, hydrocephalus, extra-axial collection or mass lesion/mass effect. Vascular: No hyperdense vessel or unexpected calcification. Skull:  Negative for fracture. 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 Sinuses/Orbits: No acute finding. Other: None. IMPRESSION: No acute intracranial abnormality. Electronically Signed   By: 02/23/2022 D.O.   On: 02/21/2022 11:17    Procedures Procedures    Medications Ordered in ED Medications  meclizine (ANTIVERT) tablet 25 mg (25 mg Oral Given 02/21/22 1050)  metoCLOPramide (REGLAN) injection 5 mg (5 mg Intravenous Given 02/21/22 1051)  acetaminophen (TYLENOL) tablet 650 mg (650 mg Oral Given 02/21/22 1049)    ED Course/ Medical Decision Making/ A&P                           Medical Decision Making Amount and/or Complexity of Data Reviewed Labs: ordered. Radiology: ordered.  Risk OTC drugs. Prescription drug management.  61 year old lady presenting to ER due to concern for dizziness, headache, nausea.  On physical exam she is well-appearing in no distress.  She does not have any acute neurologic deficit on exam and denies any other neurologic complaint on history taking.  Provided some nausea medicine, Antivert, Tylenol.  Check basic labs, CT of her head.  No acute findings.  Independently reviewed and interpreted CT scan and agree with radiology report.  No electrolyte derangement, no anemia.  Reassessed patient, her symptoms have resolved.  Given reassuring work-up and no ongoing symptoms, do not feel patient needs any advanced MRI imaging, do not feel patient needs admission to hospital at this time.  Reviewed return precautions and advise follow-up with primary care doctor, gave her Rx for meclizine at home. Suspect BPPV most likely.  After the discussed management above, the patient was determined to be safe for discharge.  The patient was in agreement with this plan and all questions regarding their care were answered.  ED return precautions were discussed and the patient will return to the ED with any significant worsening of  condition.         Final Clinical Impression(s) / ED Diagnoses Final diagnoses:  Vertigo    Rx / DC Orders ED Discharge Orders          Ordered    meclizine (ANTIVERT) 25 MG tablet  3 times daily PRN        02/21/22 1225              Milagros Loll, MD 02/21/22 1236

## 2022-02-24 NOTE — Telephone Encounter (Signed)
Returned patient call, per patient she was seen at ED for Vertigo, no concerns.

## 2022-02-25 ENCOUNTER — Other Ambulatory Visit: Payer: Self-pay | Admitting: Family Medicine

## 2022-02-25 ENCOUNTER — Telehealth: Payer: Self-pay | Admitting: Family Medicine

## 2022-02-25 DIAGNOSIS — E7800 Pure hypercholesterolemia, unspecified: Secondary | ICD-10-CM

## 2022-02-25 NOTE — Telephone Encounter (Signed)
Caller Name: pt Call back phone #: [phone]  Reason for Call: Pt was in Rd for vertigo/ She was given a medication, but is still feeling dizziness. She would like to know if this is normal.  Pt needs a refill for her crestor and sent to [employer] order.

## 2022-02-25 NOTE — Telephone Encounter (Signed)
Please advise message below, will patient need to come in for OV? 

## 2022-02-26 NOTE — Telephone Encounter (Signed)
Patient aware this is normal and to keep an eye on symptoms push plenty of fluids give [place] a call if no improvement.

## 2022-03-06 ENCOUNTER — Encounter: Payer: Self-pay | Admitting: Family Medicine

## 2022-03-06 ENCOUNTER — Ambulatory Visit (INDEPENDENT_AMBULATORY_CARE_PROVIDER_SITE_OTHER): Payer: 59 | Admitting: Family Medicine

## 2022-03-06 VITALS — BP 114/74 | HR 84 | Temp 97.3°F | Ht 61.0 in | Wt 197.4 lb

## 2022-03-06 DIAGNOSIS — Z09 Encounter for follow-up examination after completed treatment for conditions other than malignant neoplasm: Secondary | ICD-10-CM | POA: Diagnosis not present

## 2022-03-06 DIAGNOSIS — H8309 Labyrinthitis, unspecified ear: Secondary | ICD-10-CM

## 2022-03-06 MED ORDER — MECLIZINE HCL 25 MG PO TABS: 50.0000 mg | ORAL_TABLET | Freq: Three times a day (TID) | ORAL | 0 refills | Status: DC | PRN

## 2022-03-06 NOTE — Progress Notes (Signed)
Established Patient Office Visit  Subjective   Patient ID: Natalie [name], female    DOB: [birth year]  Age: 61 y.o. MRN: [id number]  Chief Complaint  Patient presents with   Hospitalization Follow-up    Hospital follow up seen for vertigo, per patient still very dizzy medication does not seem to be helping.     HPI Hospital discharge follow-up from 7/28.  Was seen for a acute episode of a spinning sensation.  Diagnosed with BPPV and given meclizine.  CT of head was normal.  Meclizine seems to help but after a while her dizziness returns.  Overall there has been mild improvement.  Symptoms are accompanied by a headache on the top of her head.  She denies scotomata nausea or any difficulty swallowing.  She does have a history of migraines but this headache is different.    Review of Systems  Constitutional: Negative.  Negative for chills, diaphoresis, malaise/fatigue and weight loss.  HENT: Negative.    Eyes: Negative.  Negative for blurred vision, double vision, discharge and redness.  Respiratory: Negative.    Cardiovascular: Negative.  Negative for chest pain.  Gastrointestinal:  Negative for abdominal pain.  Genitourinary: Negative.   Musculoskeletal: Negative.  Negative for falls and myalgias.  Skin:  Negative for rash.  Neurological:  Positive for dizziness and headaches. Negative for tingling, speech change, loss of consciousness and weakness.  Endo/Heme/Allergies:  Negative for polydipsia.  Psychiatric/Behavioral: Negative.        Objective:     BP 114/74 (BP Location: Right Arm, Patient Position: Sitting, Cuff Size: Large)   Pulse 84   Temp (!) 97.3 F (36.3 C) (Temporal)   Ht 5\' 1"  (1.549 m)   Wt 197 lb 6.4 oz (89.5 kg)   SpO2 98%   BMI 37.30 kg/m    Physical Exam Constitutional:      General: She is not in acute distress.    Appearance: Normal appearance. She is not ill-appearing, toxic-appearing or diaphoretic.  HENT:     Head: Normocephalic and atraumatic.      Right Ear: Tympanic membrane, ear canal and external ear normal.     Left Ear: Tympanic membrane, ear canal and external ear normal.     Mouth/Throat:     Mouth: Mucous membranes are moist.     Pharynx: Oropharynx is clear. No oropharyngeal exudate or posterior oropharyngeal erythema.  Eyes:     General: Gaze aligned appropriately. No visual field deficit or scleral icterus.       Right eye: No discharge.        Left eye: No discharge.     Extraocular Movements: Extraocular movements intact.     Right eye: Normal extraocular motion and no nystagmus.     Left eye: Normal extraocular motion and no nystagmus.     Conjunctiva/sclera: Conjunctivae normal.     Pupils: Pupils are equal, round, and reactive to light.  Cardiovascular:     Rate and Rhythm: Normal rate and regular rhythm.  Pulmonary:     Effort: Pulmonary effort is normal. No respiratory distress.     Breath sounds: Normal breath sounds.  Abdominal:     General: Bowel sounds are normal.     Tenderness: There is no abdominal tenderness. There is no guarding.  Musculoskeletal:     Cervical back: No rigidity or tenderness.  Skin:    General: Skin is warm and dry.  Neurological:     Mental Status: She is alert and oriented to person,  place, and time.     Cranial Nerves: No cranial nerve deficit, dysarthria or facial asymmetry.     Motor: No weakness.  Psychiatric:        Mood and Affect: Mood normal.        Behavior: Behavior normal.      No results found for any visits on 03/06/22.    The 10-year ASCVD risk score (Arnett DK, et al., 2019) is: 2.8%    Assessment & Plan:   Problem List Items Addressed This Visit       Nervous and Auditory   Labyrinthitis - Primary   Relevant Medications   meclizine (ANTIVERT) 25 MG tablet    Return if symptoms worsen or fail to improve.  May take 50 mg of meclizine as needed.  Information on the Epley maneuvers was given.  Expect 4 to 6 weeks of gradually improving  symptoms.  Relapse can happen.  Mliss Sax, MD

## 2022-03-24 NOTE — Telephone Encounter (Signed)
Na

## 2022-04-22 ENCOUNTER — Telehealth: Payer: Self-pay

## 2022-04-22 NOTE — Telephone Encounter (Signed)
Pt mammogram completed on 06/14/2021--- next due 2 yrs 06/15/2023

## 2022-06-16 ENCOUNTER — Other Ambulatory Visit: Payer: Self-pay

## 2022-06-16 DIAGNOSIS — M5440 Lumbago with sciatica, unspecified side: Secondary | ICD-10-CM

## 2022-06-16 DIAGNOSIS — M5116 Intervertebral disc disorders with radiculopathy, lumbar region: Secondary | ICD-10-CM

## 2022-06-16 MED ORDER — GABAPENTIN 300 MG PO CAPS: ORAL_CAPSULE | ORAL | 0 refills | Status: DC

## 2022-07-29 ENCOUNTER — Other Ambulatory Visit: Payer: Self-pay | Admitting: Family Medicine

## 2022-07-29 DIAGNOSIS — M5116 Intervertebral disc disorders with radiculopathy, lumbar region: Secondary | ICD-10-CM

## 2022-07-29 DIAGNOSIS — M5440 Lumbago with sciatica, unspecified side: Secondary | ICD-10-CM

## 2022-08-04 ENCOUNTER — Telehealth: Payer: Self-pay | Admitting: Family Medicine

## 2022-08-04 DIAGNOSIS — M5440 Lumbago with sciatica, unspecified side: Secondary | ICD-10-CM

## 2022-08-04 DIAGNOSIS — M5116 Intervertebral disc disorders with radiculopathy, lumbar region: Secondary | ICD-10-CM

## 2022-08-04 MED ORDER — GABAPENTIN 300 MG PO CAPS: 300.0000 mg | ORAL_CAPSULE | Freq: Three times a day (TID) | ORAL | 0 refills | Status: DC

## 2022-08-04 NOTE — Telephone Encounter (Signed)
Pharmacy added and requested refill sent to updated pharmacy.

## 2022-08-04 NOTE — Telephone Encounter (Signed)
As of 1.8.2024 pt want to change to a different pharmacy  : [place] [phone] and phone [phone]

## 2022-08-04 NOTE — Telephone Encounter (Signed)
Caller Name: Pricila Bridge Call back phone #: [phone]   MEDICATION(S):  Gabaoentin   Has the patient contacted their pharmacy (YES/NO)? Pt would like sent to mail order instead of in-person What did pharmacy advise?   Preferred Pharmacy:  [facility] - [place], [place] - [address]   Phone: [phone]  Fax: [phone]   ~~~Please advise patient/caregiver to allow 2-3 business days to process RX refills.

## 2022-09-03 ENCOUNTER — Other Ambulatory Visit: Payer: Self-pay | Admitting: Family Medicine

## 2022-09-03 DIAGNOSIS — M5116 Intervertebral disc disorders with radiculopathy, lumbar region: Secondary | ICD-10-CM

## 2022-09-03 DIAGNOSIS — M5440 Lumbago with sciatica, unspecified side: Secondary | ICD-10-CM

## 2022-09-24 ENCOUNTER — Other Ambulatory Visit: Payer: Self-pay | Admitting: Family Medicine

## 2022-09-24 DIAGNOSIS — E7800 Pure hypercholesterolemia, unspecified: Secondary | ICD-10-CM

## 2022-10-02 ENCOUNTER — Other Ambulatory Visit: Payer: Self-pay | Admitting: Family Medicine

## 2022-10-02 DIAGNOSIS — M5116 Intervertebral disc disorders with radiculopathy, lumbar region: Secondary | ICD-10-CM

## 2022-10-02 DIAGNOSIS — M5440 Lumbago with sciatica, unspecified side: Secondary | ICD-10-CM

## 2022-11-03 ENCOUNTER — Other Ambulatory Visit: Payer: Self-pay | Admitting: Family Medicine

## 2022-11-03 DIAGNOSIS — M5116 Intervertebral disc disorders with radiculopathy, lumbar region: Secondary | ICD-10-CM

## 2022-11-03 DIAGNOSIS — M5440 Lumbago with sciatica, unspecified side: Secondary | ICD-10-CM

## 2022-11-17 ENCOUNTER — Other Ambulatory Visit: Payer: Self-pay | Admitting: Family Medicine

## 2022-11-17 MED ORDER — ESCITALOPRAM OXALATE 5 MG PO TABS: 5.0000 mg | ORAL_TABLET | Freq: Every day | ORAL | 1 refills | Status: DC

## 2022-11-17 NOTE — Telephone Encounter (Signed)
Prescription Request  11/17/2022  LOV: 03/06/2022  What is the name of the medication or equipment? escitalopram (LEXAPRO) 5 MG tablet [[id number]]   Have you contacted your pharmacy to request a refill? Yes, she has an upcoming appt with Dr Doreene Burke this Thursday 4/25.  Which pharmacy would you like this sent to?  [id number] [place], [place] - [address] AT [facility] [address] [place] [address] Phone: [phone] Fax: [phone]     Patient notified that their request is being sent to the clinical staff for review and that they should receive a response within 2 business days.   Please advise at [facility] [phone]

## 2022-11-17 NOTE — Telephone Encounter (Signed)
Spoke with patient regarding requested refill on pending Rx per patient she have not had this prescription filled by Dr. Doreene Burke the physician that originally filled the Rx, patients insurance have not paid them and she's not able to schedule an appointment until this is cleared. Patient has appointment scheduled with Dr. Doreene Burke on 11/20/22. Per patient she have been without prescription for some time and this helps with her hormones her hot flashes and vomiting episodes are becoming worse without medication. Would like to know if she could have filled for 30 day supply? Please advise.

## 2022-11-20 ENCOUNTER — Encounter: Payer: Self-pay | Admitting: Family Medicine

## 2022-11-20 ENCOUNTER — Ambulatory Visit: Payer: 59 | Admitting: Family Medicine

## 2022-11-20 VITALS — BP 134/80 | HR 73 | Temp 98.1°F | Ht 61.0 in | Wt 211.2 lb

## 2022-11-20 DIAGNOSIS — E7800 Pure hypercholesterolemia, unspecified: Secondary | ICD-10-CM | POA: Diagnosis not present

## 2022-11-20 DIAGNOSIS — Z0000 Encounter for general adult medical examination without abnormal findings: Secondary | ICD-10-CM

## 2022-11-20 NOTE — Progress Notes (Signed)
Established Patient Office Visit   Subjective:  Patient ID: Natalie [name], female    DOB: 01-Sep-1960  Age: 62 y.o. MRN: [id number]  Chief Complaint  Patient presents with   Medical Management of Chronic Issues    Routine follow up, patient states that she seems to be nauseous a lot.     HPI Encounter Diagnoses  Name Primary?   Healthcare maintenance Yes   Elevated cholesterol    For health check and follow-up of elevated cholesterol.  Tough year for her.  Status post back surgery and right knee replacement over the last year.  Continues to work with neurosurgery.  She is seeking disability through their guidance.  Has finally recovered from right knee replacement but it was a tough go.  Accidental overdose of oxycodone.  She continues seeing Dr. Huntley Dec for well woman care.  She has been taking Lexapro but ran out recently.  Developed anxiety and nausea.  She has recently restarted it and is feeling better.  She lives at home with her husband.  He is scheduled for cholecystectomy next week.  Pends are otherwise well at home.  She does have access to dental care.  Not currently exercising.  She has been experiencing some sadness.  She is taking 5 mg of Lexapro.  Has been on up to 20 in the past.   Review of Systems  Constitutional: Negative.   HENT: Negative.    Eyes:  Negative for blurred vision, discharge and redness.  Respiratory: Negative.    Cardiovascular: Negative.   Gastrointestinal:  Negative for abdominal pain.  Genitourinary: Negative.   Musculoskeletal: Negative.  Negative for myalgias.  Skin:  Negative for rash.  Neurological:  Negative for tingling, loss of consciousness and weakness.  Endo/Heme/Allergies:  Negative for polydipsia.     Current Outpatient Medications:    aspirin 81 MG tablet, Take 81 mg by mouth daily., Disp: , Rfl:    betamethasone dipropionate 0.05 % cream, Apply 1 application. topically daily as needed (eczema)., Disp: , Rfl:     carboxymethylcellulose (REFRESH PLUS) 0.5 % SOLN, Place 1 drop into both eyes 3 (three) times daily as needed (dry eyes)., Disp: , Rfl:    escitalopram (LEXAPRO) 5 MG tablet, Take 1 tablet (5 mg total) by mouth daily., Disp: 30 tablet, Rfl: 1   gabapentin (NEURONTIN) 300 MG capsule, TAKE 2 CAPSULES(600 MG) BY MOUTH THREE TIMES DAILY, Disp: 180 capsule, Rfl: 0   HYDROcodone-acetaminophen (NORCO/VICODIN) 5-325 MG tablet, Take 1-2 tablets by mouth every 4 (four) hours as needed., Disp: , Rfl:    loratadine (CLARITIN) 10 MG tablet, Take 10 mg by mouth daily., Disp: , Rfl:    meclizine (ANTIVERT) 25 MG tablet, Take 2 tablets (50 mg total) by mouth 3 (three) times daily as needed for dizziness., Disp: 60 tablet, Rfl: 0   pantoprazole (PROTONIX) 40 MG tablet, Take 40 mg by mouth daily as needed (indigestion)., Disp: , Rfl:    rosuvastatin (CRESTOR) 10 MG tablet, TAKE 1 TABLET (10 MG TOTAL) BY MOUTH DAILY.  FOLLOW UP NEEDED, Disp: 90 tablet, Rfl: 0   valACYclovir (VALTREX) 500 MG tablet, Take 500 mg by mouth 2 (two) times daily as needed (shingles outbreak)., Disp: , Rfl:    docusate sodium (COLACE) 100 MG capsule, Take 100 mg by mouth daily. (Patient not taking: Reported on 11/20/2022), Disp: , Rfl:    Objective:     BP 134/80 (BP Location: Right Arm, Patient Position: Sitting, Cuff Size: Large)   Pulse  73   Temp 98.1 F (36.7 C) (Temporal)   H mhBh$  (1.549 m)   Wt 211 lb 3.2 oz (95.8 kg)   SpO2 97%   BMI 39.91 kg/m  Wt Readings from Last 3 Encounters:  11/20/22 211 lb 3.2 oz (95.8 kg)  03/06/22 197 lb 6.4 oz (89.5 kg)  02/21/22 198 lb 13.7 oz (90.2 kg)      Physical Exam Constitutional:      General: She is not in acute distress.    Appearance: Normal appearance. She is not ill-appearing, toxic-appearing or diaphoretic.  HENT:     Head: Normocephalic and atraumatic.     Right Ear: Tympanic membrane, ear canal and external ear normal.     Left Ear: Tympanic membrane, ear canal and  external ear normal.     Mouth/Throat:     Mouth: Mucous membranes are moist.     Pharynx: Oropharynx is clear. No oropharyngeal exudate or posterior oropharyngeal erythema.  Eyes:     General: No scleral icterus.       Right eye: No discharge.        Left eye: No discharge.     Extraocular Movements: Extraocular movements intact.     Conjunctiva/sclera: Conjunctivae normal.     Pupils: Pupils are equal, round, and reactive to light.  Cardiovascular:     Rate and Rhythm: Normal rate and regular rhythm.  Pulmonary:     Effort: Pulmonary effort is normal. No respiratory distress.     Breath sounds: Normal breath sounds.  Musculoskeletal:     Cervical back: No rigidity or tenderness.  Skin:    General: Skin is warm and dry.  Neurological:     Mental Status: She is alert and oriented to person, place, and time.  Psychiatric:        Mood and Affect: Mood normal.        Behavior: Behavior normal.      No results found for any visits on 11/20/22.    The 10-year ASCVD risk score (Arnett DK, et al., 2019) is: 3.8%    Assessment & Plan:   Healthcare maintenance -     CBC; Future -     Comprehensive metabolic panel; Future -     Hemoglobin A1c; Future -     Urinalysis, Routine w reflex microscopic; Future  Elevated cholesterol -     Comprehensive metabolic panel; Future -     Lipid panel; Future    Return in about 1 year (around 11/20/2023), or Please return fasting for above ordered blood work..  Continues follow-up with Dr. Janee Morn.  Suggested that she may want to see him about increasing the dose Lexapro.  Continue follow-up with Dr. Lovell Sheehan.  Encouraged her to start a walking program.   Mliss Sax, MD

## 2022-11-25 ENCOUNTER — Other Ambulatory Visit (INDEPENDENT_AMBULATORY_CARE_PROVIDER_SITE_OTHER): Payer: 59

## 2022-11-25 DIAGNOSIS — E7800 Pure hypercholesterolemia, unspecified: Secondary | ICD-10-CM

## 2022-11-25 DIAGNOSIS — Z0000 Encounter for general adult medical examination without abnormal findings: Secondary | ICD-10-CM | POA: Diagnosis not present

## 2022-11-25 LAB — URINALYSIS, ROUTINE W REFLEX MICROSCOPIC
Bilirubin Urine: NEGATIVE
Hgb urine dipstick: NEGATIVE
Ketones, ur: NEGATIVE
Leukocytes,Ua: NEGATIVE
Nitrite: NEGATIVE
Specific Gravity, Urine: >=1.03 — AB (ref 1.000–1.030)
Total Protein, Urine: NEGATIVE
Urine Glucose: NEGATIVE
Urobilinogen, UA: 0.2 (ref 0.0–1.0)
pH: 5.5 (ref 5.0–8.0)

## 2022-11-25 LAB — COMPREHENSIVE METABOLIC PANEL
ALT: 10 U/L (ref 0–35)
AST: 14 U/L (ref 0–37)
Albumin: 4.1 g/dL (ref 3.5–5.2)
Alkaline Phosphatase: 78 U/L (ref 39–117)
BUN: 9 mg/dL (ref 6–23)
CO2: 28 mEq/L (ref 19–32)
Calcium: 9 mg/dL (ref 8.4–10.5)
Chloride: 103 mEq/L (ref 96–112)
Creatinine, Ser: 0.68 mg/dL (ref 0.40–1.20)
GFR: 92.42 mL/min (ref >60.00)
Glucose, Bld: 93 mg/dL (ref 70–99)
Potassium: 3.9 mEq/L (ref 3.5–5.1)
Sodium: 140 mEq/L (ref 135–145)
Total Bilirubin: 0.5 mg/dL (ref 0.2–1.2)
Total Protein: 6.8 g/dL (ref 6.0–8.3)

## 2022-11-25 LAB — CBC
HCT: 39.9 % (ref 36.0–46.0)
Hemoglobin: 13.4 g/dL (ref 12.0–15.0)
MCHC: 33.5 g/dL (ref 30.0–36.0)
MCV: 86 fl (ref 78.0–100.0)
Platelets: 294 10*3/uL (ref 150.0–400.0)
RBC: 4.64 Mil/uL (ref 3.87–5.11)
RDW: 14.6 % (ref 11.5–15.5)
WBC: 9.3 10*3/uL (ref 4.0–10.5)

## 2022-11-25 LAB — HEMOGLOBIN A1C: Hgb A1c MFr Bld: 6.1 % (ref 4.6–6.5)

## 2022-11-25 LAB — LIPID PANEL
Cholesterol: 164 mg/dL (ref 0–200)
HDL: 52.6 mg/dL (ref >39.00)
LDL Cholesterol: 80 mg/dL (ref 0–99)
NonHDL: 111.18
Total CHOL/HDL Ratio: 3
Triglycerides: 155 mg/dL — ABNORMAL HIGH (ref 0.0–149.0)
VLDL: 31 mg/dL (ref 0.0–40.0)

## 2022-12-02 ENCOUNTER — Other Ambulatory Visit: Payer: Self-pay | Admitting: Family Medicine

## 2022-12-02 DIAGNOSIS — M5116 Intervertebral disc disorders with radiculopathy, lumbar region: Secondary | ICD-10-CM

## 2022-12-02 DIAGNOSIS — M5440 Lumbago with sciatica, unspecified side: Secondary | ICD-10-CM

## 2022-12-28 ENCOUNTER — Other Ambulatory Visit: Payer: Self-pay | Admitting: Family Medicine

## 2022-12-29 ENCOUNTER — Other Ambulatory Visit: Payer: Self-pay | Admitting: Family Medicine

## 2022-12-29 DIAGNOSIS — M5116 Intervertebral disc disorders with radiculopathy, lumbar region: Secondary | ICD-10-CM

## 2022-12-29 DIAGNOSIS — M5440 Lumbago with sciatica, unspecified side: Secondary | ICD-10-CM

## 2023-04-16 ENCOUNTER — Ambulatory Visit (INDEPENDENT_AMBULATORY_CARE_PROVIDER_SITE_OTHER): Payer: 59 | Admitting: Family Medicine

## 2023-04-16 ENCOUNTER — Encounter: Payer: Self-pay | Admitting: Family Medicine

## 2023-04-16 VITALS — BP 110/64 | HR 72 | Temp 98.0°F | Ht 61.0 in | Wt 220.0 lb

## 2023-04-16 DIAGNOSIS — R7303 Prediabetes: Secondary | ICD-10-CM

## 2023-04-16 DIAGNOSIS — E7800 Pure hypercholesterolemia, unspecified: Secondary | ICD-10-CM | POA: Diagnosis not present

## 2023-04-16 LAB — BASIC METABOLIC PANEL
BUN: 13 mg/dL (ref 6–23)
CO2: 29 mEq/L (ref 19–32)
Calcium: 9.1 mg/dL (ref 8.4–10.5)
Chloride: 104 mEq/L (ref 96–112)
Creatinine, Ser: 0.65 mg/dL (ref 0.40–1.20)
GFR: 93.18 mL/min (ref >60.00)
Glucose, Bld: 89 mg/dL (ref 70–99)
Potassium: 4 mEq/L (ref 3.5–5.1)
Sodium: 141 mEq/L (ref 135–145)

## 2023-04-16 LAB — HEMOGLOBIN A1C: Hgb A1c MFr Bld: 6.1 % (ref 4.6–6.5)

## 2023-04-16 MED ORDER — METFORMIN HCL ER 500 MG PO TB24
500.0000 mg | ORAL_TABLET | Freq: Every evening | ORAL | 1 refills | Status: DC
Start: 2023-04-16 — End: 2023-10-20

## 2023-04-16 MED ORDER — ROSUVASTATIN CALCIUM 10 MG PO TABS: 10.0000 mg | ORAL_TABLET | Freq: Every day | ORAL | 3 refills | Status: DC

## 2023-04-16 NOTE — Progress Notes (Signed)
Established Patient Office Visit   Subjective:  Patient ID: Natalie [name], female    DOB: Dec 10, 1960  Age: 62 y.o. MRN: [id number]  Chief Complaint  Patient presents with   Medical Management of Chronic Issues    Discuss A1C. Pt is not fasting.     HPI Encounter Diagnoses  Name Primary?   Prediabetes Yes   Elevated cholesterol    For follow-up of above.  She is adjusted her diet to decrease simple carbs she has been more active.  More stress at the household.  A child with spouse and their children moved in recently.  Her house is gone from 2 people to 7 people.   Review of Systems  Constitutional: Negative.   HENT: Negative.    Eyes:  Negative for blurred vision, discharge and redness.  Respiratory: Negative.    Cardiovascular: Negative.   Gastrointestinal:  Negative for abdominal pain.  Genitourinary: Negative.   Musculoskeletal: Negative.  Negative for myalgias.  Skin:  Negative for rash.  Neurological:  Negative for tingling, loss of consciousness and weakness.  Endo/Heme/Allergies:  Negative for polydipsia.     Current Outpatient Medications:    aspirin 81 MG tablet, Take 81 mg by mouth daily., Disp: , Rfl:    betamethasone dipropionate 0.05 % cream, Apply 1 application. topically daily as needed (eczema)., Disp: , Rfl:    carboxymethylcellulose (REFRESH PLUS) 0.5 % SOLN, Place 1 drop into both eyes 3 (three) times daily as needed (dry eyes)., Disp: , Rfl:    escitalopram (LEXAPRO) 5 MG tablet, TAKE 1 TABLET(5 MG) BY MOUTH DAILY, Disp: 30 tablet, Rfl: 1   HYDROcodone-acetaminophen (NORCO/VICODIN) 5-325 MG tablet, Take 1-2 tablets by mouth every 4 (four) hours as needed., Disp: , Rfl:    loratadine (CLARITIN) 10 MG tablet, Take 10 mg by mouth daily., Disp: , Rfl:    meclizine (ANTIVERT) 25 MG tablet, Take 2 tablets (50 mg total) by mouth 3 (three) times daily as needed for dizziness., Disp: 60 tablet, Rfl: 0   metFORMIN (GLUCOPHAGE-XR) 500 MG 24 hr tablet, Take 1  tablet (500 mg total) by mouth at bedtime., Disp: 90 tablet, Rfl: 1   pantoprazole (PROTONIX) 40 MG tablet, Take 40 mg by mouth daily as needed (indigestion)., Disp: , Rfl:    pregabalin (LYRICA) 225 MG capsule, Take by mouth., Disp: , Rfl:    valACYclovir (VALTREX) 500 MG tablet, Take 500 mg by mouth 2 (two) times daily as needed (shingles outbreak)., Disp: , Rfl:    docusate sodium (COLACE) 100 MG capsule, Take 100 mg by mouth daily. (Patient not taking: Reported on 11/20/2022), Disp: , Rfl:    gabapentin (NEURONTIN) 300 MG capsule, TAKE 2 CAPSULES(600 MG) BY MOUTH THREE TIMES DAILY (Patient not taking: Reported on 04/16/2023), Disp: 180 capsule, Rfl: 1   rosuvastatin (CRESTOR) 10 MG tablet, Take 1 tablet (10 mg total) by mouth daily., Disp: 90 tablet, Rfl: 3   Objective:     BP 110/64   Pulse 72   Temp 98 F (36.7 C)   Ht 5\' 1"  (1.549 m)   Wt 220 lb (99.8 kg)   SpO2 96%   BMI 41.57 kg/m  Wt Readings from Last 3 Encounters:  04/16/23 220 lb (99.8 kg)  11/20/22 211 lb 3.2 oz (95.8 kg)  03/06/22 197 lb 6.4 oz (89.5 kg)      Physical Exam Constitutional:      General: She is not in acute distress.    Appearance: Normal appearance. She  is not ill-appearing, toxic-appearing or diaphoretic.  HENT:     Head: Normocephalic and atraumatic.     Right Ear: External ear normal.     Left Ear: External ear normal.     Mouth/Throat:     Mouth: Mucous membranes are moist.     Pharynx: Oropharynx is clear. No oropharyngeal exudate or posterior oropharyngeal erythema.  Eyes:     General: No scleral icterus.       Right eye: No discharge.        Left eye: No discharge.     Extraocular Movements: Extraocular movements intact.     Conjunctiva/sclera: Conjunctivae normal.     Pupils: Pupils are equal, round, and reactive to light.  Cardiovascular:     Rate and Rhythm: Normal rate and regular rhythm.  Pulmonary:     Effort: Pulmonary effort is normal. No respiratory distress.     Breath  sounds: Normal breath sounds.  Abdominal:     General: Bowel sounds are normal.     Tenderness: There is no abdominal tenderness. There is no guarding.  Musculoskeletal:     Cervical back: No rigidity or tenderness.  Skin:    General: Skin is warm and dry.  Neurological:     Mental Status: She is alert and oriented to person, place, and time.  Psychiatric:        Mood and Affect: Mood normal.        Behavior: Behavior normal.      No results found for any visits on 04/16/23.    The 10-year ASCVD risk score (Arnett DK, et al., 2019) is: 3.4%    Assessment & Plan:   Prediabetes -     Basic metabolic panel -     Hemoglobin A1c -     metFORMIN HCl ER; Take 1 tablet (500 mg total) by mouth at bedtime.  Dispense: 90 tablet; Refill: 1  Elevated cholesterol -     Rosuvastatin Calcium; Take 1 tablet (10 mg total) by mouth daily.  Dispense: 90 tablet; Refill: 3    Return in about 6 months (around 10/14/2023), or if symptoms worsen or fail to improve.  Encouraged daily exercise for 30 minutes and that walking would be great.  Continue weight loss efforts.  Information given on prediabetes and diet plan.  Will start Glucophage.  Discussed the side effects of abdominal cramping and loose stool that passes for most people after a few weeks.  Let me know if they do not.  Continue rosuvastatin.  Mliss Sax, MD

## 2023-07-30 ENCOUNTER — Ambulatory Visit: Payer: Managed Care, Other (non HMO) | Admitting: Family Medicine

## 2023-07-30 ENCOUNTER — Encounter: Payer: Self-pay | Admitting: Family Medicine

## 2023-07-30 VITALS — BP 128/86 | HR 69 | Temp 97.3°F | Ht 61.0 in | Wt 226.8 lb

## 2023-07-30 DIAGNOSIS — R7303 Prediabetes: Secondary | ICD-10-CM

## 2023-07-30 DIAGNOSIS — M7061 Trochanteric bursitis, right hip: Secondary | ICD-10-CM | POA: Diagnosis not present

## 2023-07-30 DIAGNOSIS — E7800 Pure hypercholesterolemia, unspecified: Secondary | ICD-10-CM

## 2023-07-30 NOTE — Progress Notes (Signed)
 Established Patient Office Visit   Subjective:  Patient ID: Natalie [name], female    DOB: 1960-10-02  Age: 63 y.o. MRN: [id number]  Chief Complaint  Patient presents with   Hip Pain    Right side hip pain that radiates to lower back x 3-6 months.     Hip Pain  Pertinent negatives include no tingling.   Encounter Diagnoses  Name Primary?   Trochanteric bursitis of right hip Yes   Elevated cholesterol    Prediabetes    57-month history of right lateral hip pain.  History of 2 level lumbar fusion.  No recent injury.  Continues with rosuvastatin  and metformin  without issue.   Review of Systems  Constitutional: Negative.   HENT: Negative.    Eyes:  Negative for blurred vision, discharge and redness.  Respiratory: Negative.    Cardiovascular: Negative.   Gastrointestinal:  Negative for abdominal pain.  Genitourinary: Negative.   Musculoskeletal: Negative.  Negative for myalgias.  Skin:  Negative for rash.  Neurological:  Negative for tingling, loss of consciousness and weakness.  Endo/Heme/Allergies:  Negative for polydipsia.     Current Outpatient Medications:    aspirin 81 MG tablet, Take 81 mg by mouth daily., Disp: , Rfl:    betamethasone  dipropionate 0.05 % cream, Apply 1 application. topically daily as needed (eczema)., Disp: , Rfl:    carboxymethylcellulose (REFRESH PLUS) 0.5 % SOLN, Place 1 drop into both eyes 3 (three) times daily as needed (dry eyes)., Disp: , Rfl:    docusate sodium  (COLACE) 100 MG capsule, Take 100 mg by mouth daily., Disp: , Rfl:    escitalopram  (LEXAPRO ) 5 MG tablet, TAKE 1 TABLET(5 MG) BY MOUTH DAILY, Disp: 30 tablet, Rfl: 1   gabapentin  (NEURONTIN ) 300 MG capsule, TAKE 2 CAPSULES(600 MG) BY MOUTH THREE TIMES DAILY, Disp: 180 capsule, Rfl: 1   HYDROcodone -acetaminophen  (NORCO/VICODIN) 5-325 MG tablet, Take 1-2 tablets by mouth every 4 (four) hours as needed., Disp: , Rfl:    loratadine  (CLARITIN ) 10 MG tablet, Take 10 mg by mouth daily., Disp:  , Rfl:    meclizine  (ANTIVERT ) 25 MG tablet, Take 2 tablets (50 mg total) by mouth 3 (three) times daily as needed for dizziness., Disp: 60 tablet, Rfl: 0   metFORMIN  (GLUCOPHAGE -XR) 500 MG 24 hr tablet, Take 1 tablet (500 mg total) by mouth at bedtime., Disp: 90 tablet, Rfl: 1   pantoprazole  (PROTONIX ) 40 MG tablet, Take 40 mg by mouth daily as needed (indigestion)., Disp: , Rfl:    pregabalin  (LYRICA ) 225 MG capsule, Take by mouth., Disp: , Rfl:    rosuvastatin  (CRESTOR ) 10 MG tablet, Take 1 tablet (10 mg total) by mouth daily., Disp: 90 tablet, Rfl: 3   valACYclovir (VALTREX) 500 MG tablet, Take 500 mg by mouth 2 (two) times daily as needed (shingles outbreak)., Disp: , Rfl:    Objective:     BP 128/86 (Cuff Size: Large)   Pulse 69   Temp (!) 97.3 F (36.3 C)   Ht 5' 1 (1.549 m)   Wt 226 lb 12.8 oz (102.9 kg)   SpO2 98%   BMI 42.85 kg/m  BP Readings from Last 3 Encounters:  07/30/23 128/86  04/16/23 110/64  11/20/22 134/80   Wt Readings from Last 3 Encounters:  07/30/23 226 lb 12.8 oz (102.9 kg)  04/16/23 220 lb (99.8 kg)  11/20/22 211 lb 3.2 oz (95.8 kg)      Physical Exam Constitutional:      General: She is not  in acute distress.    Appearance: Normal appearance. She is not ill-appearing, toxic-appearing or diaphoretic.  HENT:     Head: Normocephalic and atraumatic.     Right Ear: External ear normal.     Left Ear: External ear normal.  Eyes:     General: No scleral icterus.       Right eye: No discharge.        Left eye: No discharge.     Extraocular Movements: Extraocular movements intact.     Conjunctiva/sclera: Conjunctivae normal.  Pulmonary:     Effort: Pulmonary effort is normal. No respiratory distress.  Musculoskeletal:     Right hip: Tenderness and bony tenderness present. Normal range of motion.     Left hip: Normal range of motion.       Legs:  Skin:    General: Skin is warm and dry.  Neurological:     Mental Status: She is alert and oriented  to person, place, and time.  Psychiatric:        Mood and Affect: Mood normal.        Behavior: Behavior normal.   Labs obese we may not need to get those on the months   No results found for any visits on 07/30/23.    The 10-year ASCVD risk score (Arnett DK, et al., 2019) is: 4.6%    Assessment & Plan:   Trochanteric bursitis of right hip -     Ambulatory referral to Sports Medicine  Elevated cholesterol  Prediabetes    Return in about 3 months (around 10/28/2023), or May already have scheduled appointment in March., for chronic disease follow-up.  Continue rosuvastatin  and metformin  for elevated cholesterol and prediabetes.  Follow-up in 3 months.  Sports medicine referral for hip bursitis.  Information given.  Elsie Sim Lent, MD

## 2023-08-03 ENCOUNTER — Ambulatory Visit (INDEPENDENT_AMBULATORY_CARE_PROVIDER_SITE_OTHER): Payer: Managed Care, Other (non HMO) | Admitting: Family Medicine

## 2023-08-03 ENCOUNTER — Ambulatory Visit (HOSPITAL_BASED_OUTPATIENT_CLINIC_OR_DEPARTMENT_OTHER)
Admission: RE | Admit: 2023-08-03 | Discharge: 2023-08-03 | Disposition: A | Discharge status: Home / Self Care | Payer: Managed Care, Other (non HMO) | Source: Ambulatory Visit | Attending: Family Medicine | Admitting: Family Medicine

## 2023-08-03 VITALS — BP 147/84 | Ht 62.0 in | Wt 210.0 lb

## 2023-08-03 DIAGNOSIS — M25551 Pain in right hip: Secondary | ICD-10-CM | POA: Insufficient documentation

## 2023-08-03 MED ORDER — TRIAMCINOLONE ACETONIDE 40 MG/ML IJ SUSP
40.0000 mg | Freq: Once | INTRAMUSCULAR | Status: AC
  Administered 2023-08-03: 40 mg via INTRA_ARTICULAR

## 2023-08-03 NOTE — Progress Notes (Signed)
 CHIEF COMPLAINT: No chief complaint on file.  _____________________________________________________________ SUBJECTIVE  HPI  Pt is a 63 y.o. female here for evaluation of Right hip pain  Ongoing x 6 months, states no inciting events other than potentially lying on her right side when she was going to sleep and reading her Shellee  Had appt with back surgeon at that time, was told it was her hip and was given prednisone   This medication helped reduced severity of pain, but did not remove it  Difficulty lying down on her R side Primary located hip spanning iliac crest area down to the lateral mid thigh  Sometimes will involve the R lumbar back region.  No Radiating pain numbness or tingling Therapies tried: Received a steroid dosepak (6 months ago), no other therapies tried  Seen 07/30/2023 in PCP clinic for lateral hip pain, note reviewed: -12-month onset.  Exam with bony tenderness of the right hip -History L2 lumbar fusion -Referred to sports medicine   No hip x-ray available for review  ------------------------------------------------------------------------------------------------------ Past Medical History:  Diagnosis Date   Abnormally small mouth    Arthritis    knees   Carpal tunnel syndrome on right 09/2016   Dental crowns present    also dental caps   Family history of adverse reaction to anesthesia    pt's mother and son have hx. of post-op N/V   GERD (gastroesophageal reflux disease)    History of kidney stones    Hx of migraines    Hyperlipidemia    IBS (irritable bowel syndrome)    PONV (postoperative nausea and vomiting)    1 episode only    Past Surgical History:  Procedure Laterality Date   BACK SURGERY     CARPAL TUNNEL RELEASE Right 10/03/2016   Procedure: Right limited open CARPAL TUNNEL RELEASE;  Surgeon: Elsie Mussel, MD;  Location: [facility];  Service: Orthopedics;  Laterality: Right;   CESAREAN SECTION     CHOLECYSTECTOMY   05/27/2016   COLONOSCOPY WITH PROPOFOL   04/20/2015   CYSTO     EYE SURGERY     INCISION / DEBRIDEMENT BONE ELBOW Right 06/14/2003   radial head   KNEE ARTHROSCOPY Left 01/22/2010   MAXIMUM ACCESS (MAS)POSTERIOR LUMBAR INTERBODY FUSION (PLIF) 1 LEVEL     SALPINGOOPHORECTOMY Left 03/31/2002   STERIOD INJECTION Left 10/03/2016   Procedure: STEROID INJECTION;  Surgeon: Elsie Mussel, MD;  Location: [facility];  Service: Orthopedics;  Laterality: Left;   TOTAL ABDOMINAL HYSTERECTOMY  03/31/2002      Outpatient Encounter Medications as of 08/03/2023  Medication Sig   aspirin 81 MG tablet Take 81 mg by mouth daily.   betamethasone  dipropionate 0.05 % cream Apply 1 application. topically daily as needed (eczema).   carboxymethylcellulose (REFRESH PLUS) 0.5 % SOLN Place 1 drop into both eyes 3 (three) times daily as needed (dry eyes).   docusate sodium  (COLACE) 100 MG capsule Take 100 mg by mouth daily.   escitalopram  (LEXAPRO ) 5 MG tablet TAKE 1 TABLET(5 MG) BY MOUTH DAILY   gabapentin  (NEURONTIN ) 300 MG capsule TAKE 2 CAPSULES(600 MG) BY MOUTH THREE TIMES DAILY   HYDROcodone -acetaminophen  (NORCO/VICODIN) 5-325 MG tablet Take 1-2 tablets by mouth every 4 (four) hours as needed.   loratadine  (CLARITIN ) 10 MG tablet Take 10 mg by mouth daily.   meclizine  (ANTIVERT ) 25 MG tablet Take 2 tablets (50 mg total) by mouth 3 (three) times daily as needed for dizziness.   metFORMIN  (GLUCOPHAGE -XR) 500 MG 24 hr tablet  Take 1 tablet (500 mg total) by mouth at bedtime.   pantoprazole  (PROTONIX ) 40 MG tablet Take 40 mg by mouth daily as needed (indigestion).   pregabalin  (LYRICA ) 225 MG capsule Take by mouth.   rosuvastatin  (CRESTOR ) 10 MG tablet Take 1 tablet (10 mg total) by mouth daily.   valACYclovir (VALTREX) 500 MG tablet Take 500 mg by mouth 2 (two) times daily as needed (shingles outbreak).   No facility-administered encounter medications on file as of 08/03/2023.     ------------------------------------------------------------------------------------------------------  _____________________________________________________________ OBJECTIVE  PHYSICAL EXAM  Today's Vitals   08/03/23 1425 08/03/23 1502  BP: (!) 155/83 (!) 147/84  Weight: 210 lb (95.3 kg)   Height: 5' 2 (1.575 m)    Body mass index is 38.41 kg/m.   reviewed  General: A+Ox3, no acute distress, well-nourished, appropriate affect CV: pulses 2+ regular, nondiaphoretic, no peripheral edema, cap refill <2sec Lungs: no audible wheezing, non-labored breathing, bilateral chest rise/fall, nontachypneic Skin: warm, well-perfused, non-icteric, no susp lesions or rashes Neuro: Sensation intact, muscle tone wnl, no atrophy Psych: no signs of depression or anxiety MSK:   R Hip: No deformity, swelling or wasting ROM Flexion 100, ER 20, IR 40 +trendelenberg b/l SIJ asymmetry, SIJ tenderness TTP GT NTTP over the hip flexors, adductors, inguinal canal, pubic ramus, glute musculature, lumbar spine Negative log roll; Felt snapping sensation of lateral hip with log roll test once, was unable to reproduce Negative FABER, provocative of groin pain Negative FADIR Negative Piriformis test +Ober test Negative scour Intact strength, no pain elicited with resisted abduction/adduction, hip flexion _____________________________________________________________ ASSESSMENT/PLAN Diagnoses and all orders for this visit:  Greater trochanteric pain syndrome of right lower extremity -     Ambulatory referral to Physical Therapy -     DG HIP UNILAT W OR W/O PELVIS 2-3 VIEWS RIGHT; Future -     triamcinolone  acetonide (KENALOG -40) injection 40 mg   Exam findings reviewed with patient. Options discussed for management. Advise course of formal PT, which she is agreeable to, Rx sent. Interested in GT bursa injection today. Will obtain XR for further review.  PROCEDURE:  Risks & benefits of R greater  trochanteric cortisone injection reviewed. Consent obtained. Time-out completed. Patient prepped and draped in the normal fashion. Area cleansed with chlorhexidine . Ethyl chloride spray used to anesthetize the skin. Solution of 4 mL 1% lidocaine  without epinephrine  with 1 mL kenalog  40mg /mL injected into the R greater trochanteric bursa using a 22-gauge 3.5-inch needle via the lateral approach over point of maximal tenderness. Patient tolerated procedure well without any complications. Area covered with adhesive bandage. Post-procedure care reviewed, all questions answered.  Anticipate f/u in 4-6 wks PRN for pain refractory to PT/injection. All questions answered. Return precautions discussed. Patient verbalized understanding and is in agreement with plan  Electronically signed by: Nickalaus Crooke W Tinzley Dalia, MD 08/03/2023 12:46 PM

## 2023-08-26 NOTE — Therapy (Addendum)
 OUTPATIENT PHYSICAL THERAPY LOWER EXTREMITY EVALUATION / DISCHARGE SUMMARY   Patient Name: Natalie [name] MRN: [id number] DOB: 1961-05-27, 63 y.o., female Today's Date: 08/27/2023   END OF SESSION:  PT End of Session - 08/27/23 1530     Visit Number 1    Date for PT Re-Evaluation 10/22/23    Authorization Type [employer]    PT Start Time 1530    PT Stop Time 1624    PT Time Calculation (min) 54 min    Activity Tolerance Patient tolerated treatment well    Behavior During Therapy [facility] for tasks assessed/performed             Past Medical History:  Diagnosis Date   Abnormally small mouth    Arthritis    knees   Carpal tunnel syndrome on right 09/2016   Dental crowns present    also dental caps   Family history of adverse reaction to anesthesia    pt's mother and son have hx. of post-op N/V   GERD (gastroesophageal reflux disease)    History of kidney stones    Hx of migraines    Hyperlipidemia    IBS (irritable bowel syndrome)    PONV (postoperative nausea and vomiting)    1 episode only   Past Surgical History:  Procedure Laterality Date   BACK SURGERY     CARPAL TUNNEL RELEASE Right 10/03/2016   Procedure: Right limited open CARPAL TUNNEL RELEASE;  Surgeon: Elsie Mussel, MD;  Location: [facility];  Service: Orthopedics;  Laterality: Right;   CESAREAN SECTION     CHOLECYSTECTOMY  05/27/2016   COLONOSCOPY WITH PROPOFOL   04/20/2015   CYSTO     EYE SURGERY     INCISION / DEBRIDEMENT BONE ELBOW Right 06/14/2003   radial head   KNEE ARTHROSCOPY Left 01/22/2010   MAXIMUM ACCESS (MAS)POSTERIOR LUMBAR INTERBODY FUSION (PLIF) 1 LEVEL     SALPINGOOPHORECTOMY Left 03/31/2002   STERIOD INJECTION Left 10/03/2016   Procedure: STEROID INJECTION;  Surgeon: Elsie Mussel, MD;  Location: [facility];  Service: Orthopedics;  Laterality: Left;   TOTAL ABDOMINAL HYSTERECTOMY  03/31/2002   Patient Active Problem List   Diagnosis Date Noted    Labyrinthitis 03/06/2022   Hospital discharge follow-up 03/06/2022   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   Radiculopathy due to lumbar intervertebral disc disorder 08/06/2021   Back pain of lumbar region with sciatica 07/19/2021   Aortic atherosclerosis (HCC) 10/19/2020   History of nephrolithiasis 10/16/2020   Arthritis of both knees 07/11/2020   Hair loss 01/13/2020   Healthcare maintenance 01/13/2020   Mixed hyperlipidemia 09/22/2017   Migraines 09/22/2017   Morbid obesity due to excess calories (HCC) 05/26/2016   Irritable bowel syndrome with diarrhea 02/06/2015   GERD (gastroesophageal reflux disease) 09/09/2013   Endometriosis 09/09/2013    PCP: Berneta Elsie Sayre, MD   REFERRING PROVIDER: Marcene Benedict LELON, MD   REFERRING DIAG: [phone] (ICD-10-CM) - Greater trochanteric pain syndrome of right lower extremity   THERAPY DIAG:  Pain in right hip  Other muscle spasm  Muscle weakness (generalized)  Other low back pain  RATIONALE FOR EVALUATION AND TREATMENT: Rehabilitation  ONSET DATE:  ~6 months  NEXT MD VISIT:  None scheduled / PRN   SUBJECTIVE:  SUBJECTIVE STATEMENT: Pt reports ~6 months ago she started having pain when she would try lying on her R side in bed to read.  Saw her neurosurgeon to see if it could coming from her back but he felt it was her hip. Placed on steroids with some relief but the pain never fully went away. Received an injection on 08/03/23 with no real benefit noted.  R hip pain is predominantly only when lying on R side.  She also reports chronic LBP since 2023 when she had a ruptured disc.  No PT, but has had 2 surgeries - discectomy and later fusion.  PAIN: Are you having pain? No and Yes: NPRS scale: 0/10 currently, 8/10 when lying on R  side Pain location: R lateral hip and buttock Pain description: sharp Aggravating factors: lying on R side Relieving factors: changing position taking pressure off R hip  Are you having pain? Yes: NPRS scale: 3/10 currently, at worst up to 9-10/10 Pain location: B low back (no radicular pain or N/T) Pain description: ache Aggravating factors: physical activity in general - housekeeping, shopping, prolonged walking Relieving factors: pain meds when severe, otherwise doesn't usually do anything   PERTINENT HISTORY:  OA - knees s/p R TKA (L knee in need of TKA), back surgery x 2 (discectomy & discectomy and L3-4, L4-5 fusion), lumbar DDD with radiculopathy, vertigo, aortic atherosclerosis, migraines, IBS, GERD  PRECAUTIONS: None  RED FLAGS: None  WEIGHT BEARING RESTRICTIONS: No  FALLS:  Has patient fallen in last 6 months? No  LIVING ENVIRONMENT: Lives with: lives with their family, lives with their spouse, and lives with their son Lives in: House/apartment Stairs: Yes: External: 3 steps; on left going up Has following equipment at home: None  OCCUPATION: Retired Publishing rights manager  PLOF: Independent and Leisure: reading in bed, sewing, mostly sedentary  PATIENT GOALS: To be able to lay on my R side and read.   OBJECTIVE: (objective measures completed at initial evaluation unless otherwise dated)  DIAGNOSTIC FINDINGS:  08/03/23 - DG R Hip: IMPRESSION:  No acute osseous findings or significant arthropathic changes. Mild acetabular overcoverage of both femoral heads which may predispose to pincer type femoroacetabular impingement.  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  POSTURE:  increased lumbar lordosis  PALPATION: Increased muscle tension and TTP in B lumbar parapinals, R>L  glutes and over R GT  LUMBAR ROM:   Active  Eval  Flexion Hands to ankles  Extension 25% limited  Right lateral flexion Hand to lateral knee  Left lateral flexion Hand to lateral knee  Right rotation WFL  Left rotation WFL - tight   (Blank rows = not tested)  MUSCLE LENGTH: Hamstrings: mild/mod tight B ITB: mod/severe tight B Piriformis: mod tight L, mild tight R Hip flexors: mod tight B Quads: mod tight B Heelcord: WFL  LOWER EXTREMITY ROM: Limited R hip flexion, B hip extension and hip ER/ER Active ROM Right eval Left eval  Knee flexion 117 122   LOWER EXTREMITY MMT:  MMT Right eval Left eval  Hip flexion 4- 4-  Hip extension 4 4+  Hip abduction 4 4+  Hip adduction 4 4  Hip internal rotation 4 4  Hip external rotation 3+ 3+  Knee flexion 4- 4  Knee extension 4 4  Ankle dorsiflexion 5 5  Ankle plantarflexion 4 (9 SLS HR) 3+ (4 SLS HR)  Ankle inversion    Ankle eversion     (Blank rows = not tested)  LOWER EXTREMITY SPECIAL TESTS:  Hip special tests: Ober's test: positive    TODAY'S TREATMENT:   08/27/2023 - Eval  THERAPEUTIC EXERCISE: to improve flexibility, strength and mobility.  Demonstration, verbal and tactile cues throughout for technique.  Hooklying R SKTC with towel x 30   Hooklying R KTOS piriformis stretch with towel assist x 30  Hooklying R HS with strap x 30 Supine ITB stretch with strap x 30   PATIENT EDUCATION:  Education details: PT eval findings, anticipated POC, and initial HEP Person educated: Patient Education method: Explanation, Demonstration, Verbal cues, and Handouts Education comprehension: verbalized understanding, returned demonstration, verbal cues required, and needs further education  HOME EXERCISE PROGRAM: Access Code: [email or web address] URL: [email or web address] Date: 08/27/2023 Prepared by: Elijah Hidden  Exercises - Hooklying Single Knee to Chest Stretch with Towel (Mirrored)  - 2 x daily - 7 x weekly - 3  reps - 30 sec hold - Supine Piriformis Stretch with Foot on Ground  - 2 x daily - 7 x weekly - 3 reps - 30 sec hold - Hooklying Hamstring Stretch with Strap (Mirrored)  - 2 x daily - 7 x weekly - 3 reps - 30 sec hold - Supine Iliotibial Band Stretch with Strap  - 2 x daily - 7 x weekly - 3 reps - 30 sec hold   ASSESSMENT:  CLINICAL IMPRESSION: CARLOTA PHILLEY is a 63 y.o. female who was referred to physical therapy for evaluation and treatment for R hip pain secondary to greater trochanteric pain syndrome.  She also reports h/o chronic LBP s/p back surgery x 2 (discectomy & discectomy and L3-4, L4-5 fusion) for which she never did any PT.  Patient reports onset of R lateral hip pain beginning ~6 months ago. R hip pain is worse with lying on her R side.  LBP is exacerbated by most activity causing her to have to break up tasks such as housekeeping in smaller subtasks.  Patient has deficits in lumbar and hip ROM, B proximal LE flexibility; B LE strength, abnormal posture, and TTP with abnormal muscle tension in lumbar paraspinals and glutes which are interfering with ADLs and are impacting quality of life.  On LEFS patient scored 45/80 demonstrating 43.7% disability.  Gianella will benefit from skilled PT to address above deficits to improve mobility and activity tolerance with decreased pain interference.  OBJECTIVE IMPAIRMENTS: decreased activity tolerance, decreased endurance, decreased mobility, difficulty walking, decreased ROM, decreased strength, increased fascial restrictions, impaired perceived functional ability, increased muscle spasms, impaired flexibility, improper body mechanics, postural dysfunction, pain, and limited positional tolerance.   ACTIVITY LIMITATIONS: lifting, squatting, sleeping, and locomotion level  PARTICIPATION LIMITATIONS: meal prep, cleaning, laundry, shopping, and community activity  PERSONAL FACTORS: Fitness, Past/current experiences, Time since onset of  injury/illness/exacerbation, and 3+ comorbidities: OA - knees s/p R TKA (L knee in need of TKA), back surgery x 2 (discectomy & discectomy and L3-4, L4-5 fusion), lumbar DDD with radiculopathy, vertigo, aortic atherosclerosis, migraines, IBS, GERD are also affecting patient's functional outcome.   REHAB POTENTIAL: Good  CLINICAL DECISION MAKING: Evolving/moderate complexity  EVALUATION COMPLEXITY: Moderate   GOALS: Goals reviewed with patient? Yes  SHORT TERM GOALS: Target date: 09/24/2023  Patient will be independent with initial HEP. Baseline:  Goal status: INITIAL  2.  Patient will report at least 25%  improvement in R hip and low back pain to improve QOL. Baseline: R hip - up to 8/10 when lying on R side; low back - 3/10 currently, at worst up to 9-10/10 Goal status: INITIAL  LONG TERM GOALS: Target date: 10/22/2023  Patient will be independent with advanced/ongoing HEP to improve outcomes and carryover.  Baseline:  Goal status: INITIAL  2.  Patient will report at least 50-75% improvement in R hip and low back pain to improve QOL. Baseline: R hip - up to 8/10 when lying on R side; low back - 3/10 currently, at worst up to 9-10/10 Goal status: INITIAL  3.  Patient will demonstrate improved B LE strength to >/= 4+/5 for improved stability and ease of mobility. Baseline: Refer to above LE MMT table Goal status: INITIAL  4.  Patient will report ability to lie on her R side in bed to read w/o limitation due to R hip pain.  Baseline: currently unable due to pain up to 8/10 Goal status: INITIAL  5.  Patient will report >/= 54/80 on LEFS (MCID = 9 pts) to demonstrate improved functional ability. Baseline: 45 / 80 = 56.3 % Goal status: INITIAL    PLAN:  PT FREQUENCY: 1-2x/week  PT DURATION: 8 weeks  PLANNED INTERVENTIONS: 97164- PT Re-evaluation, 97110-Therapeutic exercises, 97530- Therapeutic activity, [id number]- Neuromuscular re-education, 97535- Self Care, 02859- Manual  therapy, [phone]- Gait training, [phone]- Aquatic Therapy, 97014- Electrical stimulation (unattended), [phone]- Electrical stimulation (manual), [id number]- Ultrasound, 02966- Ionotophoresis 4mg /ml Dexamethasone , Patient/Family education, Balance training, Stair training, Taping, Dry Needling, Joint mobilization, Spinal mobilization, Cryotherapy, and Moist heat  PLAN FOR NEXT SESSION: Review initial HEP; initiate core/lumbopelvic strengthening and update HEP accordingly; MT +/- TPDN to address abnormal muscle tension in glutes and lumbar paraspinals; modalities including ionto patch to R greater trochanter or TENS as indicated   Elijah CHRISTELLA Hidden, PT 08/27/2023, 5:53 PM    PHYSICAL THERAPY DISCHARGE SUMMARY  Visits from Start of Care: 1  Current functional level related to goals / functional outcomes: Refer to above clinical impression and goal assessment for status as of eval visit on 08/27/2023. Patient cancelled first treatment visit, no showed for second visit and failed to schedule any further visits, therefore will proceed with discharge from PT for this episode.     Remaining deficits: As above. Unable to formally assess status at discharge due to failure to return to PT.    Education / Equipment: Initial HEP   Patient agrees to discharge. Patient goals were not met. Patient is being discharged due to not returning since the last visit.  Elijah EMERSON Hidden, PT 01/19/2024, 8:32 AM  [facility] [address]  [address] [place], [place], [address] Phone: [phone]   Fax:  [phone]

## 2023-08-27 ENCOUNTER — Other Ambulatory Visit: Payer: Self-pay

## 2023-08-27 ENCOUNTER — Ambulatory Visit: Payer: Managed Care, Other (non HMO) | Attending: Family Medicine | Admitting: Physical Therapy

## 2023-08-27 ENCOUNTER — Encounter: Payer: Self-pay | Admitting: Physical Therapy

## 2023-08-27 DIAGNOSIS — M6281 Muscle weakness (generalized): Secondary | ICD-10-CM | POA: Insufficient documentation

## 2023-08-27 DIAGNOSIS — M5459 Other low back pain: Secondary | ICD-10-CM | POA: Diagnosis present

## 2023-08-27 DIAGNOSIS — M25551 Pain in right hip: Secondary | ICD-10-CM | POA: Insufficient documentation

## 2023-08-27 DIAGNOSIS — M62838 Other muscle spasm: Secondary | ICD-10-CM | POA: Diagnosis present

## 2023-08-31 ENCOUNTER — Ambulatory Visit: Payer: Managed Care, Other (non HMO) | Admitting: Physical Therapy

## 2023-09-08 ENCOUNTER — Ambulatory Visit: Payer: Managed Care, Other (non HMO) | Attending: Family Medicine | Admitting: Physical Therapy

## 2023-09-24 ENCOUNTER — Ambulatory Visit: Payer: Self-pay | Admitting: Family Medicine

## 2023-09-24 NOTE — Telephone Encounter (Signed)
 Patient was sent to nurse triage and scheduled an appointment for Monday 09/28/2023 at 1pm with Dr. Doreene Burke.

## 2023-09-24 NOTE — Telephone Encounter (Signed)
 Chief Complaint: Right upper abdomen pain Symptoms: Dull ache, lay on right side and pain wakes pt up Frequency: Intermittent Pertinent Negatives: Patient denies chest pain Disposition: [] ED /[] Urgent Care (no appt availability in office) / [x] Appointment(In office/virtual)/ []  [facility]/ [] Home Care/ [] Refused Recommended Disposition /[] [facility]/ []  Follow-up with PCP Additional Notes: Pt states she saw Dr. Doreene Burke a few weeks ago for right hip pain. Pt states she got a shot in her hip and went to therapy. Pt states she did not see an improvement in the pain. Pt states the pain is higher up from her hip on the right side. Pt notes its in the ribcage area below chest.  Pt states the pain is in her right upper abdomen. Pt scheduled for an appointment on 3/3. This RN educated pt on home care, new-worsening symptoms, when to call back/seek emergent care. Pt verbalized understanding and agrees to plan.    Copied from CRM [id number]. Topic: Clinical - Red Word Triage >> Sep 24, 2023  3:36 PM Sim Boast F wrote: Red Word that prompted transfer to Nurse Triage: Patient seen last month for pain in hip but she thinks it more above her hip and it wakes her up at night while she's laying on her right side, says pain is 10/10, went to physical therapy once and its not working Reason for Disposition  Abdominal pain is a chronic symptom (recurrent or ongoing AND present > 4 weeks)  Answer Assessment - Initial Assessment Questions 1. LOCATION: "Where does it hurt?"      Right upper abdomen 2. RADIATION: "Does the pain shoot anywhere else?" (e.g., chest, back)     Radiates down right sdie 3. ONSET: "When did the pain begin?" (e.g., minutes, hours or days ago)      Around 6 months 4. SUDDEN: "Gradual or sudden onset?"     Sudden, pt did have back surgery where rods were placed 5. PATTERN "Does the pain come and go, or is it constant?"    - If it comes and goes: "How long does it last?"  "Do you have pain now?"     (Note: Comes and goes means the pain is intermittent. It goes away completely between bouts.)    - If constant: "Is it getting better, staying the same, or getting worse?"      (Note: Constant means the pain never goes away completely; most serious pain is constant and gets worse.)      Intermittent 6. SEVERITY: "How bad is the pain?"  (e.g., Scale 1-10; mild, moderate, or severe)    - MILD (1-3): Doesn't interfere with normal activities, abdomen soft and not tender to touch.     - MODERATE (4-7): Interferes with normal activities or awakens from sleep, abdomen tender to touch.     - SEVERE (8-10): Excruciating pain, doubled over, unable to do any normal activities.       When it wakes pt up, 10; any other time it is a 1  Protocols used: Abdominal Pain - [facility]

## 2023-09-28 ENCOUNTER — Ambulatory Visit (INDEPENDENT_AMBULATORY_CARE_PROVIDER_SITE_OTHER): Payer: Managed Care, Other (non HMO) | Admitting: Family Medicine

## 2023-09-28 ENCOUNTER — Encounter: Payer: Self-pay | Admitting: Family Medicine

## 2023-09-28 ENCOUNTER — Telehealth: Payer: Self-pay | Admitting: Family Medicine

## 2023-09-28 VITALS — BP 122/82 | HR 83 | Temp 98.1°F | Ht 62.0 in | Wt 230.2 lb

## 2023-09-28 DIAGNOSIS — R0789 Other chest pain: Secondary | ICD-10-CM | POA: Diagnosis not present

## 2023-09-28 DIAGNOSIS — R1011 Right upper quadrant pain: Secondary | ICD-10-CM

## 2023-09-28 DIAGNOSIS — R10A0 Flank pain, unspecified side: Secondary | ICD-10-CM

## 2023-09-28 DIAGNOSIS — R109 Unspecified abdominal pain: Secondary | ICD-10-CM | POA: Insufficient documentation

## 2023-09-28 LAB — COMPREHENSIVE METABOLIC PANEL
ALT: 10 U/L (ref 0–35)
AST: 13 U/L (ref 0–37)
Albumin: 4.1 g/dL (ref 3.5–5.2)
Alkaline Phosphatase: 71 U/L (ref 39–117)
BUN: 12 mg/dL (ref 6–23)
CO2: 31 meq/L (ref 19–32)
Calcium: 9 mg/dL (ref 8.4–10.5)
Chloride: 104 meq/L (ref 96–112)
Creatinine, Ser: 0.77 mg/dL (ref 0.40–1.20)
GFR: 81.38 mL/min (ref >60.00)
Glucose, Bld: 86 mg/dL (ref 70–99)
Potassium: 3.9 meq/L (ref 3.5–5.1)
Sodium: 143 meq/L (ref 135–145)
Total Bilirubin: 0.3 mg/dL (ref 0.2–1.2)
Total Protein: 6.8 g/dL (ref 6.0–8.3)

## 2023-09-28 LAB — LIPASE: Lipase: 57 U/L (ref 11.0–59.0)

## 2023-09-28 LAB — URINALYSIS, ROUTINE W REFLEX MICROSCOPIC
Bilirubin Urine: NEGATIVE
Hgb urine dipstick: NEGATIVE
Leukocytes,Ua: NEGATIVE
Nitrite: NEGATIVE
RBC / HPF: NONE SEEN (ref 0–?)
Specific Gravity, Urine: >=1.03 — AB (ref 1.000–1.030)
Total Protein, Urine: NEGATIVE
Urine Glucose: NEGATIVE
Urobilinogen, UA: 0.2 (ref 0.0–1.0)
pH: 5.5 (ref 5.0–8.0)

## 2023-09-28 LAB — CBC
HCT: 39.9 % (ref 36.0–46.0)
Hemoglobin: 12.9 g/dL (ref 12.0–15.0)
MCHC: 32.4 g/dL (ref 30.0–36.0)
MCV: 86.7 fl (ref 78.0–100.0)
Platelets: 251 10*3/uL (ref 150.0–400.0)
RBC: 4.6 Mil/uL (ref 3.87–5.11)
RDW: 15.3 % (ref 11.5–15.5)
WBC: 9.9 10*3/uL (ref 4.0–10.5)

## 2023-09-28 LAB — AMYLASE: Amylase: 28 U/L (ref 27–131)

## 2023-09-28 NOTE — Progress Notes (Addendum)
 Established Patient Office Visit   Subjective:  Patient ID: Natalie [name], female    DOB: November 20, 1960  Age: 63 y.o. MRN: [id number]  No chief complaint on file.   HPI Encounter Diagnoses  Name Primary?   Flank pain Yes   Atypical chest pain    Right upper quadrant abdominal pain    6-month history of intermittent right flank pain.  There have been no fevers chills, night sweats or weight loss.  No difficulty with urination or changing urinary patterns.  History of irritable bowel syndrome with bouts of constipation alternating with looser stool.  These have not changed for her.  Up-to-date on well woman care.  She has experienced some nausea.  She denies reflux or vomiting. She does recall short episodes of chest pain not associated with shortness of breath, above nausea, diaphoresis or exacerbation with activity.   Review of Systems  Constitutional: Negative.   HENT: Negative.    Eyes:  Negative for blurred vision, discharge and redness.  Respiratory: Negative.  Negative for shortness of breath.   Cardiovascular:  Positive for chest pain.  Gastrointestinal:  Positive for nausea. Negative for abdominal pain, heartburn and vomiting.  Genitourinary: Negative.  Negative for dysuria, frequency, hematuria and urgency.  Musculoskeletal: Negative.  Negative for myalgias.  Skin:  Negative for rash.  Neurological:  Negative for tingling, loss of consciousness and weakness.  Endo/Heme/Allergies:  Negative for polydipsia.     Current Outpatient Medications:    aspirin 81 MG tablet, Take 81 mg by mouth daily., Disp: , Rfl:    betamethasone dipropionate 0.05 % cream, Apply 1 application. topically daily as needed (eczema)., Disp: , Rfl:    carboxymethylcellulose (REFRESH PLUS) 0.5 % SOLN, Place 1 drop into both eyes 3 (three) times daily as needed (dry eyes)., Disp: , Rfl:    docusate sodium (COLACE) 100 MG capsule, Take 100 mg by mouth daily. (Patient not taking: Reported on 08/27/2023),  Disp: , Rfl:    escitalopram (LEXAPRO) 5 MG tablet, TAKE 1 TABLET(5 MG) BY MOUTH DAILY, Disp: 30 tablet, Rfl: 1   gabapentin (NEURONTIN) 300 MG capsule, TAKE 2 CAPSULES(600 MG) BY MOUTH THREE TIMES DAILY (Patient not taking: Reported on 08/27/2023), Disp: 180 capsule, Rfl: 1   HYDROcodone-acetaminophen (NORCO/VICODIN) 5-325 MG tablet, Take 1-2 tablets by mouth every 4 (four) hours as needed., Disp: , Rfl:    loratadine (CLARITIN) 10 MG tablet, Take 10 mg by mouth daily., Disp: , Rfl:    meclizine (ANTIVERT) 25 MG tablet, Take 2 tablets (50 mg total) by mouth 3 (three) times daily as needed for dizziness., Disp: 60 tablet, Rfl: 0   metFORMIN (GLUCOPHAGE-XR) 500 MG 24 hr tablet, Take 1 tablet (500 mg total) by mouth at bedtime., Disp: 90 tablet, Rfl: 1   pantoprazole (PROTONIX) 40 MG tablet, Take 40 mg by mouth daily as needed (indigestion)., Disp: , Rfl:    pregabalin (LYRICA) 225 MG capsule, Take by mouth., Disp: , Rfl:    rosuvastatin (CRESTOR) 10 MG tablet, Take 1 tablet (10 mg total) by mouth daily., Disp: 90 tablet, Rfl: 3   valACYclovir (VALTREX) 500 MG tablet, Take 500 mg by mouth 2 (two) times daily as needed (shingles outbreak)., Disp: , Rfl:    Objective:     BP 122/82   Pulse 83   Temp 98.1 F (36.7 C)   Ht 5\' 2"  (1.575 m)   Wt 230 lb 3.2 oz (104.4 kg)   SpO2 96%   BMI 42.10 kg/m  Physical Exam Constitutional:      General: She is not in acute distress.    Appearance: Normal appearance. She is not ill-appearing, toxic-appearing or diaphoretic.  HENT:     Head: Normocephalic and atraumatic.     Right Ear: External ear normal.     Left Ear: External ear normal.     Mouth/Throat:     Mouth: Mucous membranes are moist.     Pharynx: Oropharynx is clear. No oropharyngeal exudate or posterior oropharyngeal erythema.  Eyes:     General: No scleral icterus.       Right eye: No discharge.        Left eye: No discharge.     Extraocular Movements: Extraocular movements intact.      Conjunctiva/sclera: Conjunctivae normal.     Pupils: Pupils are equal, round, and reactive to light.  Cardiovascular:     Rate and Rhythm: Normal rate and regular rhythm.  Pulmonary:     Effort: Pulmonary effort is normal. No respiratory distress.     Breath sounds: Normal breath sounds. No wheezing, rhonchi or rales.  Abdominal:     General: Bowel sounds are normal.     Tenderness: There is right CVA tenderness. There is no left CVA tenderness.  Musculoskeletal:     Cervical back: No rigidity or tenderness.  Skin:    General: Skin is warm and dry.  Neurological:     Mental Status: She is alert and oriented to person, place, and time.  Psychiatric:        Mood and Affect: Mood normal.        Behavior: Behavior normal.      Results for orders placed or performed in visit on 09/28/23  CBC  Result Value Ref Range   WBC 9.9 4.0 - 10.5 K/uL   RBC 4.60 3.87 - 5.11 Mil/uL   Platelets 251.0 150.0 - 400.0 K/uL   Hemoglobin 12.9 12.0 - 15.0 g/dL   HCT 04.5 40.9 - 81.1 %   MCV 86.7 78.0 - 100.0 fl   MCHC 32.4 30.0 - 36.0 g/dL   RDW 91.4 78.2 - 95.6 %  Amylase  Result Value Ref Range   Amylase 28 27 - 131 U/L  Comprehensive metabolic panel  Result Value Ref Range   Sodium 143 135 - 145 mEq/L   Potassium 3.9 3.5 - 5.1 mEq/L   Chloride 104 96 - 112 mEq/L   CO2 31 19 - 32 mEq/L   Glucose, Bld 86 70 - 99 mg/dL   BUN 12 6 - 23 mg/dL   Creatinine, Ser 2.13 0.40 - 1.20 mg/dL   Total Bilirubin 0.3 0.2 - 1.2 mg/dL   Alkaline Phosphatase 71 39 - 117 U/L   AST 13 0 - 37 U/L   ALT 10 0 - 35 U/L   Total Protein 6.8 6.0 - 8.3 g/dL   Albumin 4.1 3.5 - 5.2 g/dL   GFR 08.65 >78.46 mL/min   Calcium 9.0 8.4 - 10.5 mg/dL  Lipase  Result Value Ref Range   Lipase 57.0 11.0 - 59.0 U/L  Urinalysis, Routine w reflex microscopic  Result Value Ref Range   Color, Urine YELLOW Yellow;Lt. Yellow;Straw;Dark Yellow;Amber;Green;Red;Brown   APPearance Sl Cloudy (A) Clear;Turbid;Slightly  Cloudy;Cloudy   Specific Gravity, Urine >=1.030 (A) 1.000 - 1.030   pH 5.5 5.0 - 8.0   Total Protein, Urine NEGATIVE Negative   Urine Glucose NEGATIVE Negative   Ketones, ur TRACE (A) Negative   Bilirubin Urine NEGATIVE Negative  Hgb urine dipstick NEGATIVE Negative   Urobilinogen, UA 0.2 0.0 - 1.0   Leukocytes,Ua NEGATIVE Negative   Nitrite NEGATIVE Negative   WBC, UA 0-2/hpf 0-2/hpf   RBC / HPF none seen 0-2/hpf   Mucus, UA Presence of (A) None   Squamous Epithelial / HPF Many(>10/hpf) (A) Rare(0-4/hpf)   Bacteria, UA Many(>50/hpf) (A) None   Ca Oxalate Crys, UA Presence of (A) None      The 10-year ASCVD risk score (Arnett DK, et al., 2019) is: 4.2%    Assessment & Plan:   Flank pain -     CBC -     Amylase -     Comprehensive metabolic panel -     Lipase -     Urinalysis, Routine w reflex microscopic -     CT ABDOMEN PELVIS WO CONTRAST; Future  Atypical chest pain -     Ambulatory referral to Cardiology  Right upper quadrant abdominal pain -     CBC -     Amylase -     Comprehensive metabolic panel -     Lipase -     Urinalysis, Routine w reflex microscopic -     CT ABDOMEN PELVIS WO CONTRAST; Future    Return Should have follow-up appointment in May..  Chest pain is atypical the patient has risk factors for heart disease.  Mliss Sax, MD

## 2023-09-28 NOTE — Telephone Encounter (Signed)
 Copied from CRM [id number]. Topic: General - Other >> Sep 28, 2023  3:46 PM Deaijah H wrote: Reason for CRM: [facility] called in to see if Dr. Doreene Burke would like stone protocol done on patient. Please call [phone] option 1 followed by option 4. Would need to be changed due to it not being a with or out without

## 2023-09-29 ENCOUNTER — Encounter: Payer: Self-pay | Admitting: Family Medicine

## 2023-09-29 NOTE — Telephone Encounter (Signed)
 Spoke to representative at [facility] imaging and relied message below.; she verbalized understanding.

## 2023-10-02 NOTE — Telephone Encounter (Signed)
 Copied from CRM [id number]. Topic: General - Other >> Oct 02, 2023  2:49 PM Jon Gills C wrote: Reason for CRM: Patient called in stating that she would like to schedule the imaging . Would like for someone to give her a callback by the end of the day in regards to this situation

## 2023-10-02 NOTE — Telephone Encounter (Signed)
 [facility] is needing clarification on this order. She said if it's for a kidney stone, the order needs to state abd and pelvis without contrast.

## 2023-10-05 ENCOUNTER — Telehealth: Payer: Self-pay

## 2023-10-05 NOTE — Telephone Encounter (Signed)
 Copied from CRM [id number]. Topic: General - Other >> Oct 02, 2023  2:49 PM Jon Gills C wrote: Reason for CRM: Patient called in stating that she would like to schedule the imaging . Would like for someone to give her a callback by the end of the day in regards to this situation >> Oct 05, 2023  1:27 PM [place] J wrote: Patient would like a call back for imaging scheduling. Patient is not starting to experience pain and would like a call back today.

## 2023-10-05 NOTE — Addendum Note (Signed)
 Addended by: Andrez Grime on: 10/05/2023 02:28 PM   Modules accepted: Orders

## 2023-10-05 NOTE — Telephone Encounter (Signed)
 Spoke to [facility] regarding CT scan; order will need to be changed to with contrast or without contrast and not both selected.

## 2023-10-06 NOTE — Addendum Note (Signed)
 Addended by: Andrez Grime on: 10/06/2023 03:07 PM   Modules accepted: Orders

## 2023-10-08 ENCOUNTER — Encounter: Payer: Self-pay | Admitting: Family Medicine

## 2023-10-08 NOTE — Telephone Encounter (Signed)
 Noted. Pt is scheduled to have scan performed tomorrow.

## 2023-10-09 ENCOUNTER — Ambulatory Visit
Admission: RE | Admit: 2023-10-09 | Discharge: 2023-10-09 | Disposition: A | Discharge status: Home / Self Care | Source: Ambulatory Visit | Attending: Family Medicine | Admitting: Family Medicine

## 2023-10-09 DIAGNOSIS — R1011 Right upper quadrant pain: Secondary | ICD-10-CM

## 2023-10-09 DIAGNOSIS — R109 Unspecified abdominal pain: Secondary | ICD-10-CM

## 2023-10-12 ENCOUNTER — Encounter: Payer: Self-pay | Admitting: Family Medicine

## 2023-10-20 ENCOUNTER — Other Ambulatory Visit: Payer: Self-pay | Admitting: Family Medicine

## 2023-10-20 DIAGNOSIS — R7303 Prediabetes: Secondary | ICD-10-CM

## 2023-11-02 ENCOUNTER — Ambulatory Visit (INDEPENDENT_AMBULATORY_CARE_PROVIDER_SITE_OTHER): Admitting: Family Medicine

## 2023-11-02 ENCOUNTER — Encounter: Payer: Self-pay | Admitting: Family Medicine

## 2023-11-02 DIAGNOSIS — J4530 Mild persistent asthma, uncomplicated: Secondary | ICD-10-CM | POA: Diagnosis not present

## 2023-11-02 DIAGNOSIS — J301 Allergic rhinitis due to pollen: Secondary | ICD-10-CM

## 2023-11-02 DIAGNOSIS — F4321 Adjustment disorder with depressed mood: Secondary | ICD-10-CM

## 2023-11-02 DIAGNOSIS — R051 Acute cough: Secondary | ICD-10-CM | POA: Diagnosis not present

## 2023-11-02 MED ORDER — BENZONATATE 200 MG PO CAPS: 200.0000 mg | ORAL_CAPSULE | Freq: Two times a day (BID) | ORAL | 0 refills | Status: DC | PRN

## 2023-11-02 MED ORDER — PREDNISONE 20 MG PO TABS: 20.0000 mg | ORAL_TABLET | Freq: Two times a day (BID) | ORAL | 0 refills | Status: AC

## 2023-11-02 NOTE — Progress Notes (Signed)
 Established Patient Office Visit   Subjective:  Patient ID: Natalie [name], female    DOB: [birth year]  Age: 63 y.o. MRN: [id number]  Chief Complaint  Patient presents with   Cough    Cough x 2 weeks. Production cough and post nasal drainage, sinus pressure.  Taking Flonase, allergy med and Nyquil.      a little less than a week ago. oral antihistamine and a nonsedatingCough Associated symptoms include wheezing. Pertinent negatives include no chills, ear pain, eye redness, fever, hemoptysis, myalgias, rash or shortness of breath.   Encounter Diagnoses  Name Primary?   Allergic rhinitis due to pollen, unspecified seasonality Yes   Mild persistent reactive airway disease without complication    Acute cough    Grieving    2-week history of ongoing clear rhinorrhea, postnasal drip and cough productive of clear phlegm.  Status post facial pressure but is resolved.  Denies teeth pain or fever or chills.  There has been some wheezing.  No history of asthma.  Distant history of tobacco use.  Stepfather passed recently and his funeral is in 2 days.  She started Flonase and a nonsedating   Review of Systems  Constitutional: Negative.  Negative for chills and fever.  HENT:  Positive for congestion. Negative for ear pain and sinus pain.   Eyes:  Negative for blurred vision, discharge and redness.  Respiratory:  Positive for cough and wheezing. Negative for hemoptysis and shortness of breath.   Cardiovascular: Negative.   Gastrointestinal:  Negative for abdominal pain.  Genitourinary: Negative.   Musculoskeletal: Negative.  Negative for myalgias.  Skin:  Negative for rash.  Neurological:  Negative for tingling, loss of consciousness and weakness.  Endo/Heme/Allergies:  Negative for polydipsia.     Current Outpatient Medications:    aspirin 81 MG tablet, Take 81 mg by mouth daily., Disp: , Rfl:    benzonatate (TESSALON) 200 MG capsule, Take 1 capsule (200 mg total) by mouth 2 (two) times  daily as needed for cough., Disp: 20 capsule, Rfl: 0   betamethasone dipropionate 0.05 % cream, Apply 1 application. topically daily as needed (eczema)., Disp: , Rfl:    carboxymethylcellulose (REFRESH PLUS) 0.5 % SOLN, Place 1 drop into both eyes 3 (three) times daily as needed (dry eyes)., Disp: , Rfl:    escitalopram (LEXAPRO) 5 MG tablet, TAKE 1 TABLET(5 MG) BY MOUTH DAILY, Disp: 30 tablet, Rfl: 1   HYDROcodone-acetaminophen (NORCO/VICODIN) 5-325 MG tablet, Take 1-2 tablets by mouth every 4 (four) hours as needed., Disp: , Rfl:    loratadine (CLARITIN) 10 MG tablet, Take 10 mg by mouth daily., Disp: , Rfl:    meclizine (ANTIVERT) 25 MG tablet, Take 2 tablets (50 mg total) by mouth 3 (three) times daily as needed for dizziness., Disp: 60 tablet, Rfl: 0   metFORMIN (GLUCOPHAGE-XR) 500 MG 24 hr tablet, TAKE 1 TABLET(500 MG) BY MOUTH AT BEDTIME, Disp: 90 tablet, Rfl: 1   pantoprazole (PROTONIX) 40 MG tablet, Take 40 mg by mouth daily as needed (indigestion)., Disp: , Rfl:    predniSONE (DELTASONE) 20 MG tablet, Take 1 tablet (20 mg total) by mouth 2 (two) times daily with a meal for 7 days., Disp: 14 tablet, Rfl: 0   pregabalin (LYRICA) 225 MG capsule, Take by mouth., Disp: , Rfl:    rosuvastatin (CRESTOR) 10 MG tablet, Take 1 tablet (10 mg total) by mouth daily., Disp: 90 tablet, Rfl: 3   valACYclovir (VALTREX) 500 MG tablet, Take 500 mg by  mouth 2 (two) times daily as needed (shingles outbreak)., Disp: , Rfl:    docusate sodium (COLACE) 100 MG capsule, Take 100 mg by mouth daily. (Patient not taking: Reported on 08/27/2023), Disp: , Rfl:    gabapentin (NEURONTIN) 300 MG capsule, TAKE 2 CAPSULES(600 MG) BY MOUTH THREE TIMES DAILY (Patient not taking: Reported on 11/02/2023), Disp: 180 capsule, Rfl: 1   Objective:     There were no vitals taken for this visit.   Physical Exam Constitutional:      General: She is not in acute distress.    Appearance: Normal appearance. She is not ill-appearing,  toxic-appearing or diaphoretic.  HENT:     Head: Normocephalic and atraumatic.     Right Ear: Tympanic membrane, ear canal and external ear normal.     Left Ear: Tympanic membrane, ear canal and external ear normal.     Mouth/Throat:     Mouth: Mucous membranes are moist.     Pharynx: Oropharynx is clear. No oropharyngeal exudate or posterior oropharyngeal erythema.  Eyes:     General: No scleral icterus.       Right eye: No discharge.        Left eye: No discharge.     Extraocular Movements: Extraocular movements intact.     Conjunctiva/sclera: Conjunctivae normal.     Pupils: Pupils are equal, round, and reactive to light.  Cardiovascular:     Rate and Rhythm: Normal rate and regular rhythm.  Pulmonary:     Effort: Pulmonary effort is normal. No respiratory distress.     Breath sounds: Wheezing present. No rales.  Abdominal:     General: Bowel sounds are normal.     Tenderness: There is no abdominal tenderness. There is no guarding.  Musculoskeletal:     Cervical back: No rigidity or tenderness.  Skin:    General: Skin is warm and dry.  Neurological:     Mental Status: She is alert and oriented to person, place, and time.  Psychiatric:        Mood and Affect: Mood normal.        Behavior: Behavior normal.      No results found for any visits on 11/02/23.    The 10-year ASCVD risk score (Arnett DK, et al., 2019) is: 4.2%    Assessment & Plan:   Allergic rhinitis due to pollen, unspecified seasonality -     predniSONE; Take 1 tablet (20 mg total) by mouth 2 (two) times daily with a meal for 7 days.  Dispense: 14 tablet; Refill: 0  Mild persistent reactive airway disease without complication -     predniSONE; Take 1 tablet (20 mg total) by mouth 2 (two) times daily with a meal for 7 days.  Dispense: 14 tablet; Refill: 0  Acute cough -     Benzonatate; Take 1 capsule (200 mg total) by mouth 2 (two) times daily as needed for cough.  Dispense: 20 capsule; Refill:  0  Grieving    Return if symptoms worsen or fail to improve.  Continue Flonase as it may take 10 to 14 days to work.  Continue nonsedating antihistamine.  Will add prednisone 20 twice daily for 7 days.  Tessalon as needed.  Mliss Sax, MD

## 2023-11-20 ENCOUNTER — Encounter: Payer: 59 | Admitting: Family Medicine

## 2023-11-23 ENCOUNTER — Ambulatory Visit: Admitting: Cardiology

## 2023-12-04 ENCOUNTER — Ambulatory Visit (INDEPENDENT_AMBULATORY_CARE_PROVIDER_SITE_OTHER): Admitting: Family Medicine

## 2023-12-04 ENCOUNTER — Encounter: Payer: Self-pay | Admitting: Family Medicine

## 2023-12-04 VITALS — BP 130/84 | HR 68 | Temp 98.6°F | Ht 62.0 in | Wt 230.0 lb

## 2023-12-04 DIAGNOSIS — Z23 Encounter for immunization: Secondary | ICD-10-CM | POA: Diagnosis not present

## 2023-12-04 DIAGNOSIS — F341 Dysthymic disorder: Secondary | ICD-10-CM | POA: Diagnosis not present

## 2023-12-04 DIAGNOSIS — R7303 Prediabetes: Secondary | ICD-10-CM | POA: Insufficient documentation

## 2023-12-04 DIAGNOSIS — E7800 Pure hypercholesterolemia, unspecified: Secondary | ICD-10-CM | POA: Diagnosis not present

## 2023-12-04 DIAGNOSIS — Z0000 Encounter for general adult medical examination without abnormal findings: Secondary | ICD-10-CM | POA: Diagnosis not present

## 2023-12-04 DIAGNOSIS — R5383 Other fatigue: Secondary | ICD-10-CM | POA: Diagnosis not present

## 2023-12-04 DIAGNOSIS — R0683 Snoring: Secondary | ICD-10-CM | POA: Insufficient documentation

## 2023-12-04 LAB — URINALYSIS, ROUTINE W REFLEX MICROSCOPIC
Bilirubin Urine: NEGATIVE
Ketones, ur: NEGATIVE
Leukocytes,Ua: NEGATIVE
Nitrite: NEGATIVE
Specific Gravity, Urine: 1.025 (ref 1.000–1.030)
Total Protein, Urine: NEGATIVE
Urine Glucose: NEGATIVE
Urobilinogen, UA: 0.2 (ref 0.0–1.0)
pH: 6 (ref 5.0–8.0)

## 2023-12-04 LAB — LIPID PANEL
Cholesterol: 167 mg/dL (ref 0–200)
HDL: 48.7 mg/dL (ref >39.00)
LDL Cholesterol: 90 mg/dL (ref 0–99)
NonHDL: 118.77
Total CHOL/HDL Ratio: 3
Triglycerides: 144 mg/dL (ref 0.0–149.0)
VLDL: 28.8 mg/dL (ref 0.0–40.0)

## 2023-12-04 LAB — CBC WITH DIFFERENTIAL/PLATELET
Basophils Absolute: 0.1 10*3/uL (ref 0.0–0.1)
Basophils Relative: 1.5 % (ref 0.0–3.0)
Eosinophils Absolute: 0.2 10*3/uL (ref 0.0–0.7)
Eosinophils Relative: 3 % (ref 0.0–5.0)
HCT: 42.5 % (ref 36.0–46.0)
Hemoglobin: 13.8 g/dL (ref 12.0–15.0)
Lymphocytes Relative: 24.3 % (ref 12.0–46.0)
Lymphs Abs: 1.9 10*3/uL (ref 0.7–4.0)
MCHC: 32.5 g/dL (ref 30.0–36.0)
MCV: 86.9 fl (ref 78.0–100.0)
Monocytes Absolute: 0.4 10*3/uL (ref 0.1–1.0)
Monocytes Relative: 5.3 % (ref 3.0–12.0)
Neutro Abs: 5.3 10*3/uL (ref 1.4–7.7)
Neutrophils Relative %: 65.9 % (ref 43.0–77.0)
Platelets: 325 10*3/uL (ref 150.0–400.0)
RBC: 4.89 Mil/uL (ref 3.87–5.11)
RDW: 15.2 % (ref 11.5–15.5)
WBC: 8 10*3/uL (ref 4.0–10.5)

## 2023-12-04 LAB — TSH: TSH: 1.53 u[IU]/mL (ref 0.35–5.50)

## 2023-12-04 LAB — HEMOGLOBIN A1C: Hgb A1c MFr Bld: 6.3 % (ref 4.6–6.5)

## 2023-12-04 NOTE — Progress Notes (Signed)
 Established Patient Office Visit   Subjective:  Patient ID: Natalie [name], female    DOB: 02-06-1961  Age: 63 y.o. MRN: [id number]  Chief Complaint  Patient presents with   Annual Exam    Cpe. Pt is fasting.     HPI Encounter Diagnoses  Name Primary?   Healthcare maintenance Yes   Elevated cholesterol    Prediabetes    Other fatigue    Morbid obesity due to excess calories (HCC)    Snores    Immunization due    Dysthymia    For physical exam and follow-up of above.  Continues rosuvastatin  for elevated cholesterol and metformin  XR 500 mg at at bedtime for prediabetes.  She has gained about 20 pounds since the first of the year.  Continues to grieve her stepfather's death back in this past 11-14-23.  She has been helping her mother.  She is not currently exercising.  She does have regular dental care.  She is not sleeping well.  Her husband says that her snoring has gotten louder.  She does not feel rested in the morning.   Review of Systems  Constitutional:  Positive for malaise/fatigue.  HENT: Negative.    Eyes:  Negative for blurred vision, discharge and redness.  Respiratory: Negative.    Cardiovascular: Negative.   Gastrointestinal:  Negative for abdominal pain.  Genitourinary: Negative.   Musculoskeletal: Negative.  Negative for myalgias.  Skin:  Negative for rash.  Neurological:  Negative for tingling, loss of consciousness and weakness.  Endo/Heme/Allergies:  Negative for polydipsia.  Psychiatric/Behavioral:  The patient has insomnia.   Okay she is good in the   Current Outpatient Medications:    aspirin 81 MG tablet, Take 81 mg by mouth daily., Disp: , Rfl:    betamethasone  dipropionate 0.05 % cream, Apply 1 application. topically daily as needed (eczema)., Disp: , Rfl:    carboxymethylcellulose (REFRESH PLUS) 0.5 % SOLN, Place 1 drop into both eyes 3 (three) times daily as needed (dry eyes)., Disp: , Rfl:    escitalopram  (LEXAPRO ) 5 MG tablet, TAKE 1 TABLET(5 MG)  BY MOUTH DAILY, Disp: 30 tablet, Rfl: 1   HYDROcodone -acetaminophen  (NORCO/VICODIN) 5-325 MG tablet, Take 1-2 tablets by mouth every 4 (four) hours as needed., Disp: , Rfl:    loratadine  (CLARITIN ) 10 MG tablet, Take 10 mg by mouth daily., Disp: , Rfl:    meclizine  (ANTIVERT ) 25 MG tablet, Take 2 tablets (50 mg total) by mouth 3 (three) times daily as needed for dizziness., Disp: 60 tablet, Rfl: 0   metFORMIN  (GLUCOPHAGE -XR) 500 MG 24 hr tablet, Take 1 tablet (500 mg total) by mouth 2 (two) times daily with a meal., Disp: 180 tablet, Rfl: 1   pantoprazole  (PROTONIX ) 40 MG tablet, Take 40 mg by mouth daily as needed (indigestion)., Disp: , Rfl:    pregabalin  (LYRICA ) 225 MG capsule, Take by mouth., Disp: , Rfl:    rosuvastatin  (CRESTOR ) 10 MG tablet, Take 1 tablet (10 mg total) by mouth daily., Disp: 90 tablet, Rfl: 3   valACYclovir (VALTREX) 500 MG tablet, Take 500 mg by mouth 2 (two) times daily as needed (shingles outbreak)., Disp: , Rfl:    benzonatate  (TESSALON ) 200 MG capsule, Take 1 capsule (200 mg total) by mouth 2 (two) times daily as needed for cough. (Patient not taking: Reported on 12/04/2023), Disp: 20 capsule, Rfl: 0   docusate sodium  (COLACE) 100 MG capsule, Take 100 mg by mouth daily. (Patient not taking: Reported on 08/27/2023), Disp: ,  Rfl:    gabapentin  (NEURONTIN ) 300 MG capsule, TAKE 2 CAPSULES(600 MG) BY MOUTH THREE TIMES DAILY (Patient not taking: Reported on 12/04/2023), Disp: 180 capsule, Rfl: 1   Objective:     BP 130/84 (Cuff Size: Normal)   Pulse 68   Temp 98.6 F (37 C) (Temporal)   Ht 5\' 2"  (1.575 m)   Wt 230 lb (104.3 kg)   SpO2 95%   BMI 42.07 kg/m  BP Readings from Last 3 Encounters:  12/04/23 130/84  09/28/23 122/82  08/03/23 (!) 147/84   Wt Readings from Last 3 Encounters:  12/04/23 230 lb (104.3 kg)  09/28/23 230 lb 3.2 oz (104.4 kg)  08/03/23 210 lb (95.3 kg)      Physical Exam Constitutional:      General: She is not in acute distress.     Appearance: Normal appearance. She is not ill-appearing, toxic-appearing or diaphoretic.  HENT:     Head: Normocephalic and atraumatic.     Right Ear: Tympanic membrane, ear canal and external ear normal.     Left Ear: Tympanic membrane, ear canal and external ear normal.     Mouth/Throat:     Mouth: Mucous membranes are moist.     Pharynx: Oropharynx is clear. No oropharyngeal exudate or posterior oropharyngeal erythema.  Eyes:     General: No scleral icterus.       Right eye: No discharge.        Left eye: No discharge.     Extraocular Movements: Extraocular movements intact.     Conjunctiva/sclera: Conjunctivae normal.     Pupils: Pupils are equal, round, and reactive to light.  Cardiovascular:     Rate and Rhythm: Normal rate and regular rhythm.  Pulmonary:     Effort: Pulmonary effort is normal. No respiratory distress.     Breath sounds: Normal breath sounds. No wheezing, rhonchi or rales.  Abdominal:     General: Bowel sounds are normal.  Musculoskeletal:     Cervical back: No rigidity or tenderness.  Lymphadenopathy:     Cervical: No cervical adenopathy.  Skin:    General: Skin is warm and dry.  Neurological:     Mental Status: She is alert and oriented to person, place, and time.  Psychiatric:        Mood and Affect: Mood normal.        Behavior: Behavior normal.      Results for orders placed or performed in visit on 12/04/23  CBC with Differential/Platelet  Result Value Ref Range   WBC 8.0 4.0 - 10.5 K/uL   RBC 4.89 3.87 - 5.11 Mil/uL   Hemoglobin 13.8 12.0 - 15.0 g/dL   HCT 16.1 09.6 - 04.5 %   MCV 86.9 78.0 - 100.0 fl   MCHC 32.5 30.0 - 36.0 g/dL   RDW 40.9 81.1 - 91.4 %   Platelets 325.0 150.0 - 400.0 K/uL   Neutrophils Relative % 65.9 43.0 - 77.0 %   Lymphocytes Relative 24.3 12.0 - 46.0 %   Monocytes Relative 5.3 3.0 - 12.0 %   Eosinophils Relative 3.0 0.0 - 5.0 %   Basophils Relative 1.5 0.0 - 3.0 %   Neutro Abs 5.3 1.4 - 7.7 K/uL   Lymphs Abs  1.9 0.7 - 4.0 K/uL   Monocytes Absolute 0.4 0.1 - 1.0 K/uL   Eosinophils Absolute 0.2 0.0 - 0.7 K/uL   Basophils Absolute 0.1 0.0 - 0.1 K/uL  Lipid panel  Result Value Ref Range  Cholesterol 167 0 - 200 mg/dL   Triglycerides 161.0 0.0 - 149.0 mg/dL   HDL 96.04 >54.09 mg/dL   VLDL 81.1 0.0 - 91.4 mg/dL   LDL Cholesterol 90 0 - 99 mg/dL   Total CHOL/HDL Ratio 3    NonHDL 118.77   TSH  Result Value Ref Range   TSH 1.53 0.35 - 5.50 uIU/mL  Urinalysis, Routine w reflex microscopic  Result Value Ref Range   Color, Urine YELLOW Yellow;Lt. Yellow;Straw;Dark Yellow;Amber;Green;Red;Brown   APPearance CLEAR Clear;Turbid;Slightly Cloudy;Cloudy   Specific Gravity, Urine 1.025 1.000 - 1.030   pH 6.0 5.0 - 8.0   Total Protein, Urine NEGATIVE Negative   Urine Glucose NEGATIVE Negative   Ketones, ur NEGATIVE Negative   Bilirubin Urine NEGATIVE Negative   Hgb urine dipstick TRACE-INTACT (A) Negative   Urobilinogen, UA 0.2 0.0 - 1.0   Leukocytes,Ua NEGATIVE Negative   Nitrite NEGATIVE Negative   WBC, UA 0-2/hpf 0-2/hpf   RBC / HPF 0-2/hpf 0-2/hpf   Squamous Epithelial / HPF Rare(0-4/hpf) Rare(0-4/hpf)  Hemoglobin A1c  Result Value Ref Range   Hgb A1c MFr Bld 6.3 4.6 - 6.5 %      The 10-year ASCVD risk score (Arnett DK, et al., 2019) is: 4.9%    Assessment & Plan:   Healthcare maintenance -     Urinalysis, Routine w reflex microscopic  Elevated cholesterol -     Lipid panel  Prediabetes -     Hemoglobin A1c -     metFORMIN  HCl ER; Take 1 tablet (500 mg total) by mouth 2 (two) times daily with a meal.  Dispense: 180 tablet; Refill: 1  Other fatigue -     CBC with Differential/Platelet  Morbid obesity due to excess calories (HCC)  Snores -     Pulmonary Visit  Immunization due -     Tdap vaccine greater than or equal to 7yo IM -     Pneumococcal conjugate vaccine 20-valent  Dysthymia -     TSH    Return in about 3 months (around 03/05/2024).  We began the  conversation about weight loss.  Recommended walking for 30 minutes daily.  Information given on exercising to lose weight.  Information given on health maintenance and disease prevention.  Tdap and Prevnar 20 today.  She will have the shingles vaccine through her pharmacy after she begins Medicare.  Tonna Frederic, MD  5/12 addendum: With weight gain A1c has increased.  Have increased metformin  to twice daily.

## 2023-12-07 MED ORDER — METFORMIN HCL ER 500 MG PO TB24: 500.0000 mg | ORAL_TABLET | Freq: Two times a day (BID) | ORAL | 1 refills | Status: DC

## 2023-12-07 NOTE — Addendum Note (Signed)
 Addended by: Delene Feinstein on: 12/07/2023 08:25 AM   Modules accepted: Orders

## 2023-12-28 ENCOUNTER — Other Ambulatory Visit (HOSPITAL_COMMUNITY): Payer: Self-pay

## 2023-12-28 ENCOUNTER — Other Ambulatory Visit (HOSPITAL_BASED_OUTPATIENT_CLINIC_OR_DEPARTMENT_OTHER): Payer: Self-pay

## 2023-12-29 ENCOUNTER — Other Ambulatory Visit (HOSPITAL_COMMUNITY): Payer: Self-pay

## 2023-12-29 ENCOUNTER — Other Ambulatory Visit (HOSPITAL_BASED_OUTPATIENT_CLINIC_OR_DEPARTMENT_OTHER): Payer: Self-pay

## 2023-12-29 ENCOUNTER — Other Ambulatory Visit: Payer: Self-pay

## 2023-12-30 ENCOUNTER — Ambulatory Visit: Admitting: Cardiology

## 2024-01-05 ENCOUNTER — Other Ambulatory Visit (HOSPITAL_COMMUNITY): Payer: Self-pay

## 2024-01-05 ENCOUNTER — Other Ambulatory Visit: Payer: Self-pay

## 2024-01-05 MED ORDER — ESCITALOPRAM OXALATE 5 MG PO TABS
5.0000 mg | ORAL_TABLET | Freq: Every day | ORAL | 2 refills | Status: DC
  Filled 2024-01-05: qty 90, 90d supply, fill #0

## 2024-01-05 MED ORDER — ROSUVASTATIN CALCIUM 10 MG PO TABS
10.0000 mg | ORAL_TABLET | Freq: Every day | ORAL | 3 refills | Status: DC
  Filled 2024-01-05: qty 90, 90d supply, fill #0

## 2024-01-06 ENCOUNTER — Other Ambulatory Visit: Payer: Self-pay

## 2024-01-06 ENCOUNTER — Encounter: Payer: Self-pay | Admitting: Pharmacist

## 2024-01-11 ENCOUNTER — Other Ambulatory Visit: Payer: Self-pay

## 2024-01-13 ENCOUNTER — Other Ambulatory Visit (HOSPITAL_COMMUNITY): Payer: Self-pay

## 2024-01-18 ENCOUNTER — Other Ambulatory Visit (HOSPITAL_COMMUNITY): Payer: Self-pay

## 2024-01-18 ENCOUNTER — Other Ambulatory Visit: Payer: Self-pay | Admitting: Family Medicine

## 2024-01-18 ENCOUNTER — Other Ambulatory Visit: Payer: Self-pay

## 2024-01-18 DIAGNOSIS — R7303 Prediabetes: Secondary | ICD-10-CM

## 2024-01-18 MED ORDER — PREGABALIN 225 MG PO CAPS
225.0000 mg | ORAL_CAPSULE | Freq: Two times a day (BID) | ORAL | 2 refills | Status: DC
  Filled 2024-01-18: qty 60, 30d supply, fill #0
  Filled 2024-02-17: qty 60, 30d supply, fill #1
  Filled 2024-03-21: qty 60, 30d supply, fill #2

## 2024-01-18 NOTE — Telephone Encounter (Signed)
 Copied from CRM [id number]. Topic: Clinical - Medication Refill >> Jan 18, 2024 10:32 AM [place] E wrote: Medication: metFORMIN  (GLUCOPHAGE -XR) 500 MG 24 hr tablet  Has the patient contacted their pharmacy? No (Agent: If no, request that the patient contact the pharmacy for the refill. If patient does not wish to contact the pharmacy document the reason why and proceed with request.) (Agent: If yes, when and what did the pharmacy advise?)  This is the patient's preferred pharmacy:   [facility] [address] [place]. [place], [place] [address] Phone: [phone].  Is this the correct pharmacy for this prescription? Yes If no, delete pharmacy and type the correct one.   Has the prescription been filled recently? Yes  Is the patient out of the medication? No, about a week and a half left.  Has the patient been seen for an appointment in the last year OR does the patient have an upcoming appointment? Yes  Can we respond through MyChart? Yes  Agent: Please be advised that Rx refills may take up to 3 business days. We ask that you follow-up with your pharmacy.

## 2024-01-20 ENCOUNTER — Other Ambulatory Visit (HOSPITAL_COMMUNITY): Payer: Self-pay

## 2024-01-21 ENCOUNTER — Encounter: Payer: Self-pay | Admitting: Family Medicine

## 2024-01-22 ENCOUNTER — Ambulatory Visit: Payer: Self-pay | Admitting: Nurse Practitioner

## 2024-01-22 ENCOUNTER — Ambulatory Visit (HOSPITAL_BASED_OUTPATIENT_CLINIC_OR_DEPARTMENT_OTHER)
Admission: RE | Admit: 2024-01-22 | Discharge: 2024-01-22 | Disposition: A | Discharge status: Home / Self Care | Source: Ambulatory Visit | Attending: Nurse Practitioner | Admitting: Nurse Practitioner

## 2024-01-22 ENCOUNTER — Ambulatory Visit (INDEPENDENT_AMBULATORY_CARE_PROVIDER_SITE_OTHER): Admitting: Nurse Practitioner

## 2024-01-22 ENCOUNTER — Encounter: Payer: Self-pay | Admitting: Nurse Practitioner

## 2024-01-22 VITALS — BP 122/74 | HR 69 | Temp 97.2°F | Ht 62.0 in | Wt 227.8 lb

## 2024-01-22 DIAGNOSIS — R2242 Localized swelling, mass and lump, left lower limb: Secondary | ICD-10-CM | POA: Diagnosis not present

## 2024-01-22 DIAGNOSIS — M79672 Pain in left foot: Secondary | ICD-10-CM | POA: Diagnosis not present

## 2024-01-22 DIAGNOSIS — M7989 Other specified soft tissue disorders: Secondary | ICD-10-CM | POA: Diagnosis not present

## 2024-01-22 NOTE — Patient Instructions (Signed)
 It was great to see you!  I have ordered an xray of your foot at the [facility].   Wear a compression sleeve on your foot to help with the swelling  You can take ibuprofen or tylenol  as needed for pain  Let's follow-up based on results   Take care,  Tinnie Harada, NP

## 2024-01-22 NOTE — Assessment & Plan Note (Signed)
 She experiences intermittent left foot pain and swelling without trauma. An x-ray will be ordered to rule out a stress fracture. Compression and elevation are recommended. She should wear a compression sleeve or ACE wrap and elevate her foot when sitting. Acetaminophen  or ibuprofen is recommended for pain management. Follow-up will be based on x-ray results.

## 2024-01-22 NOTE — Progress Notes (Signed)
 Acute Office Visit  Subjective:     Patient ID: Natalie [name], female    DOB: 1961-01-18, 63 y.o.   MRN: [id number]  Chief Complaint  Patient presents with   Foot Swelling    Left foot pain with swelling for 2 weeks, worse in the mornings    HPI Discussed the use of AI scribe software for clinical note transcription with the patient, who gave verbal consent to proceed.  History of Present Illness   Natalie [name] is a 63 year old female with neuropathy who presents with left foot pain and swelling.  She has experienced pain and swelling on the top of her left foot for the past couple of weeks. The pain is localized to the top of the foot and does not extend to the leg. Swelling is worse in the morning and improves throughout the day. There have been no recent injuries to the foot.  Her neuropathy typically worsens at night and in the morning, affecting the sides of both feet. The current pain and swelling differ from her usual neuropathy symptoms, which are usually between the first and second toe and underneath the foot. Her neuropathy causes her feet to feel extremely cold when sitting with her feet down, which she manages by wrapping them in a blanket. She is taking pregabalin  for neuropathy.  No chest pain, shortness of breath, or swelling in other joints. She has not been walking more than usual and has not noticed any swelling in her other foot.     ROS See pertinent positives and negatives per HPI.     Objective:    BP 122/74 (BP Location: Left Arm, Patient Position: Sitting, Cuff Size: Large)   Pulse 69   Temp (!) 97.2 F (36.2 C)   Ht 5' 2 (1.575 m)   Wt 227 lb 12.8 oz (103.3 kg)   SpO2 98%   BMI 41.67 kg/m    Physical Exam Vitals and nursing note reviewed.  Constitutional:      General: She is not in acute distress.    Appearance: Normal appearance.  HENT:     Head: Normocephalic.   Eyes:     Conjunctiva/sclera: Conjunctivae normal.     Cardiovascular:     Rate and Rhythm: Normal rate and regular rhythm.     Pulses: Normal pulses.     Heart sounds: Normal heart sounds.  Pulmonary:     Effort: Pulmonary effort is normal.     Breath sounds: Normal breath sounds.   Musculoskeletal:        General: Swelling (left foot) and tenderness (top of left foot) present. No deformity.     Cervical back: Normal range of motion.   Skin:    General: Skin is warm.     Findings: No erythema.   Neurological:     General: No focal deficit present.     Mental Status: She is alert and oriented to person, place, and time.   Psychiatric:        Mood and Affect: Mood normal.        Behavior: Behavior normal.        Thought Content: Thought content normal.        Judgment: Judgment normal.       Assessment & Plan:   Problem List Items Addressed This Visit       Other   Localized swelling of left foot - Primary   She experiences intermittent left foot pain and swelling without  trauma. An x-ray will be ordered to rule out a stress fracture. Compression and elevation are recommended. She should wear a compression sleeve or ACE wrap and elevate her foot when sitting. Acetaminophen  or ibuprofen is recommended for pain management. Follow-up will be based on x-ray results.        Relevant Orders   DG Foot Complete Left    No orders of the defined types were placed in this encounter.   Return if symptoms worsen or fail to improve.  Tinnie DELENA Harada, NP

## 2024-02-09 ENCOUNTER — Other Ambulatory Visit: Payer: Self-pay | Admitting: Family Medicine

## 2024-02-09 ENCOUNTER — Other Ambulatory Visit (HOSPITAL_COMMUNITY): Payer: Self-pay

## 2024-02-09 DIAGNOSIS — R7303 Prediabetes: Secondary | ICD-10-CM

## 2024-02-09 MED ORDER — ROSUVASTATIN CALCIUM 10 MG PO TABS
10.0000 mg | ORAL_TABLET | Freq: Every day | ORAL | 3 refills | Status: DC
  Filled 2024-02-09: qty 90, 90d supply, fill #0
  Filled 2024-05-19: qty 90, 90d supply, fill #1

## 2024-02-09 NOTE — Telephone Encounter (Signed)
 Copied from CRM [id number]. Topic: Clinical - Medication Refill >> Feb 09, 2024  3:56 PM Henretta I wrote: Medication: metFORMIN  (GLUCOPHAGE -XR) 500 MG 24 hr tablet rosuvastatin  (CRESTOR ) 10 MG tablet  Has the patient contacted their pharmacy? No (Agent: If no, request that the patient contact the pharmacy for the refill. If patient does not wish to contact the pharmacy document the reason why and proceed with request.) (Agent: If yes, when and what did the pharmacy advise?)  This is the patient's preferred pharmacy:  [facility] [address] [place] [address] Phone: [phone] Fax: [phone]  Is this the correct pharmacy for this prescription? Yes If no, delete pharmacy and type the correct one.   Has the prescription been filled recently? No  Is the patient out of the medication? No  Has the patient been seen for an appointment in the last year OR does the patient have an upcoming appointment? Yes  Can we respond through MyChart? Yes  Agent: Please be advised that Rx refills may take up to 3 business days. We ask that you follow-up with your pharmacy.

## 2024-02-10 ENCOUNTER — Other Ambulatory Visit (HOSPITAL_COMMUNITY): Payer: Self-pay

## 2024-02-10 MED ORDER — ESCITALOPRAM OXALATE 5 MG PO TABS
5.0000 mg | ORAL_TABLET | Freq: Every day | ORAL | 0 refills | Status: DC
  Filled 2024-02-10: qty 90, 90d supply, fill #0

## 2024-02-11 ENCOUNTER — Other Ambulatory Visit (HOSPITAL_COMMUNITY): Payer: Self-pay

## 2024-02-11 ENCOUNTER — Other Ambulatory Visit: Payer: Self-pay | Admitting: Family Medicine

## 2024-02-11 ENCOUNTER — Telehealth: Payer: Self-pay

## 2024-02-11 DIAGNOSIS — R7303 Prediabetes: Secondary | ICD-10-CM

## 2024-02-11 NOTE — Telephone Encounter (Signed)
LMOV  

## 2024-02-11 NOTE — Telephone Encounter (Unsigned)
 Copied from CRM [id number]. Topic: Clinical - Prescription Issue >> Feb 11, 2024 10:32 AM Jayma L wrote: Reason for CRM:  patient called in stated she had her metformin  500mg  refused 2 days ago when she submitted it and she's confused why this happened. Said she's been out for a week and pharmacy called this morning to get a refill sent back for her. Asking for a callback to know reason this happened. She's asking that we please go ahead and refill it now . No symptoms since not having it though

## 2024-02-12 ENCOUNTER — Other Ambulatory Visit (HOSPITAL_COMMUNITY): Payer: Self-pay

## 2024-02-13 ENCOUNTER — Other Ambulatory Visit (HOSPITAL_COMMUNITY): Payer: Self-pay

## 2024-02-15 ENCOUNTER — Other Ambulatory Visit (HOSPITAL_COMMUNITY): Payer: Self-pay

## 2024-02-17 ENCOUNTER — Other Ambulatory Visit: Payer: Self-pay

## 2024-02-17 ENCOUNTER — Other Ambulatory Visit (HOSPITAL_COMMUNITY): Payer: Self-pay

## 2024-03-04 ENCOUNTER — Encounter: Payer: Self-pay | Admitting: Family Medicine

## 2024-03-04 ENCOUNTER — Ambulatory Visit: Admitting: Family Medicine

## 2024-03-04 ENCOUNTER — Other Ambulatory Visit (HOSPITAL_COMMUNITY): Payer: Self-pay

## 2024-03-04 VITALS — BP 128/82 | HR 65 | Temp 97.2°F | Ht 62.0 in | Wt 226.2 lb

## 2024-03-04 DIAGNOSIS — R7303 Prediabetes: Secondary | ICD-10-CM

## 2024-03-04 DIAGNOSIS — H8309 Labyrinthitis, unspecified ear: Secondary | ICD-10-CM

## 2024-03-04 DIAGNOSIS — Z6841 Body Mass Index (BMI) 40.0 and over, adult: Secondary | ICD-10-CM | POA: Diagnosis not present

## 2024-03-04 LAB — BASIC METABOLIC PANEL WITH GFR
BUN: 9 mg/dL (ref 6–23)
CO2: 30 meq/L (ref 19–32)
Calcium: 8.8 mg/dL (ref 8.4–10.5)
Chloride: 100 meq/L (ref 96–112)
Creatinine, Ser: 0.68 mg/dL (ref 0.40–1.20)
GFR: 91.6 mL/min (ref >60.00)
Glucose, Bld: 94 mg/dL (ref 70–99)
Potassium: 4.2 meq/L (ref 3.5–5.1)
Sodium: 138 meq/L (ref 135–145)

## 2024-03-04 LAB — HEMOGLOBIN A1C: Hgb A1c MFr Bld: 6.5 % (ref 4.6–6.5)

## 2024-03-04 MED ORDER — TIRZEPATIDE-WEIGHT MANAGEMENT 2.5 MG/0.5ML ~~LOC~~ SOAJ
2.5000 mg | SUBCUTANEOUS | 2 refills | Status: DC
  Filled 2024-03-04: qty 2, 28d supply, fill #0

## 2024-03-04 MED ORDER — MECLIZINE HCL 25 MG PO TABS
50.0000 mg | ORAL_TABLET | Freq: Three times a day (TID) | ORAL | 0 refills | Status: AC | PRN
  Filled 2024-03-04: qty 60, 10d supply, fill #0

## 2024-03-04 NOTE — Progress Notes (Signed)
 Established Patient Office Visit   Subjective:  Patient ID: Natalie [name], female    DOB: 09-Apr-1961  Age: 63 y.o. MRN: [id number]  Chief Complaint  Patient presents with   Medical Management of Chronic Issues    3 month follow up. Pt is fasting. Pt requesting Rx refill for her dizziness. Meclizine .    Foot Swelling    Left foot edema.     HPI Encounter Diagnoses  Name Primary?   Morbid obesity with BMI of 40.0-44.9, adult (HCC) Yes   Labyrinthitis, unspecified laterality    Prediabetes    For follow-up of above.  She has decreased the carbohydrates in her diet.  She is walking some.  Continues with metformin  without issue.  Continues having intermittent issues with labyrinthitis and would like a prescription of meclizine  refilled.  nonpainful swelling in the dorsum of her left foot.   Review of Systems  Constitutional: Negative.   HENT: Negative.    Eyes:  Negative for blurred vision, discharge and redness.  Respiratory: Negative.    Cardiovascular: Negative.   Gastrointestinal:  Negative for abdominal pain.  Genitourinary: Negative.   Musculoskeletal: Negative.  Negative for myalgias.  Skin:  Negative for rash.  Neurological:  Negative for tingling, loss of consciousness and weakness.  Endo/Heme/Allergies:  Negative for polydipsia.     Current Outpatient Medications:    aspirin 81 MG tablet, Take 81 mg by mouth daily., Disp: , Rfl:    betamethasone  dipropionate 0.05 % cream, Apply 1 application. topically daily as needed (eczema)., Disp: , Rfl:    carboxymethylcellulose (REFRESH PLUS) 0.5 % SOLN, Place 1 drop into both eyes 3 (three) times daily as needed (dry eyes)., Disp: , Rfl:    escitalopram  (LEXAPRO ) 5 MG tablet, Take 1 tablet (5 mg total) by mouth daily., Disp: 90 tablet, Rfl: 0   HYDROcodone -acetaminophen  (NORCO/VICODIN) 5-325 MG tablet, Take 1-2 tablets by mouth every 4 (four) hours as needed., Disp: , Rfl:    loratadine  (CLARITIN ) 10 MG tablet, Take 10 mg by  mouth daily., Disp: , Rfl:    metFORMIN  (GLUCOPHAGE -XR) 500 MG 24 hr tablet, Take 1 tablet (500 mg total) by mouth 2 (two) times daily with a meal., Disp: 180 tablet, Rfl: 1   pantoprazole  (PROTONIX ) 40 MG tablet, Take 40 mg by mouth daily as needed (indigestion)., Disp: , Rfl:    pregabalin  (LYRICA ) 225 MG capsule, Take 1 capsule (225 mg total) by mouth 2 (two) times daily., Disp: 60 capsule, Rfl: 2   rosuvastatin  (CRESTOR ) 10 MG tablet, Take 1 tablet (10 mg total) by mouth daily., Disp: 90 tablet, Rfl: 3   tirzepatide  (ZEPBOUND ) 2.5 MG/0.5ML injection vial, Inject 2.5 mg into the skin once a week., Disp: 2 mL, Rfl: 2   valACYclovir (VALTREX) 500 MG tablet, Take 500 mg by mouth 2 (two) times daily as needed (shingles outbreak)., Disp: , Rfl:    benzonatate  (TESSALON ) 200 MG capsule, Take 1 capsule (200 mg total) by mouth 2 (two) times daily as needed for cough., Disp: 20 capsule, Rfl: 0   docusate sodium  (COLACE) 100 MG capsule, Take 100 mg by mouth daily. (Patient not taking: Reported on 01/22/2024), Disp: , Rfl:    escitalopram  (LEXAPRO ) 5 MG tablet, Take 1 tablet (5 mg total) by mouth daily., Disp: 90 tablet, Rfl: 2   meclizine  (ANTIVERT ) 25 MG tablet, Take 2 tablets (50 mg total) by mouth 3 (three) times daily as needed for dizziness., Disp: 60 tablet, Rfl: 0   Objective:  BP 128/82 (BP Location: Left Arm, Patient Position: Sitting, Cuff Size: Large)   Pulse 65   Temp (!) 97.2 F (36.2 C) (Temporal)   Ht 5' 2 (1.575 m)   Wt 226 lb 3.2 oz (102.6 kg)   SpO2 97%   BMI 41.37 kg/m    Physical Exam Constitutional:      General: She is not in acute distress.    Appearance: Normal appearance. She is not ill-appearing, toxic-appearing or diaphoretic.  HENT:     Head: Normocephalic and atraumatic.     Right Ear: External ear normal.     Left Ear: External ear normal.  Eyes:     General: No scleral icterus.       Right eye: No discharge.        Left eye: No discharge.      Extraocular Movements: Extraocular movements intact.     Conjunctiva/sclera: Conjunctivae normal.  Pulmonary:     Effort: Pulmonary effort is normal. No respiratory distress.  Skin:    General: Skin is warm and dry.  Neurological:     Mental Status: She is alert and oriented to person, place, and time.  Psychiatric:        Mood and Affect: Mood normal.        Behavior: Behavior normal.    Diabetic Foot Exam - Simple   Simple Foot Form Diabetic Foot exam was performed with the following findings: Yes 03/04/2024 10:03 AM  Visual Inspection See comments: Yes Sensation Testing Intact to touch and monofilament testing bilaterally: Yes Pulse Check Posterior Tibialis and Dorsalis pulse intact bilaterally: Yes Comments Feet are planus bilaterally.  There is mild swelling in the distal dorsum of the left foot.      No results found for any visits on 03/04/24.    The 10-year ASCVD risk score (Arnett DK, et al., 2019) is: 5.3%    Assessment & Plan:   Morbid obesity with BMI of 40.0-44.9, adult (HCC) -     Tirzepatide -Weight Management; Inject 2.5 mg into the skin once a week.  Dispense: 2 mL; Refill: 2  Labyrinthitis, unspecified laterality -     Meclizine  HCl; Take 2 tablets (50 mg total) by mouth 3 (three) times daily as needed for dizziness.  Dispense: 60 tablet; Refill: 0 -     Ambulatory referral to Physical Therapy  Prediabetes -     Basic metabolic panel with GFR -     Hemoglobin A1c -     Tirzepatide -Weight Management; Inject 2.5 mg into the skin once a week.  Dispense: 2 mL; Refill: 2    Return in about 3 months (around 06/04/2024).  Encouraged regular exercise by walking for at least 30 minutes 5 days weekly.  Information was given on exercising to lose weight.  Information was given on BMI.  Information was given on tirzepatide .  Discussed common side effects of tirzepatide  to include nausea/vomiting, diarrhea and/or constipation.  With significant abdominal pain, will  need an emergency evaluation with increased risk for pancreatitis and intestinal obstruction.  Also discussed the theoretical risk of thyroid  C cancer.  Also advised that she would need to engage in some type of weight training to prevent sarcopenia.  She will continue reducing simple carbohydrates in the diet.  Elsie Sim Lent, MD

## 2024-03-07 ENCOUNTER — Ambulatory Visit: Payer: Self-pay | Admitting: Family Medicine

## 2024-03-07 ENCOUNTER — Other Ambulatory Visit (HOSPITAL_COMMUNITY): Payer: Self-pay

## 2024-03-08 ENCOUNTER — Telehealth (HOSPITAL_COMMUNITY): Payer: Self-pay

## 2024-03-08 ENCOUNTER — Other Ambulatory Visit (HOSPITAL_COMMUNITY): Payer: Self-pay

## 2024-03-08 NOTE — Telephone Encounter (Signed)
 PA request has been Received. New Encounter has been or will be created for follow up. For additional info see Pharmacy Prior Auth telephone encounter from 03/08/24.

## 2024-03-09 ENCOUNTER — Telehealth: Payer: Self-pay | Admitting: Family Medicine

## 2024-03-09 NOTE — Telephone Encounter (Signed)
 Copied from CRM #[id number]. Topic: Clinical - Medication Prior Auth >> Mar 08, 2024  4:46 PM Chiquita SQUIBB wrote: Reason for CRM: Sonu from [facility] [employer] is calling in regarding the Zepbound  medication they need the diagnose code, list of the medications the patient has tried and failed, and if the patient has sleep apnea, and the chart notes for the sleep apnea. She stated they have faxed this as well and to please fax it back to that same number of [phone] . Please advise [facility] at [phone] option 2,  for any questions.

## 2024-03-10 NOTE — Addendum Note (Signed)
 Addended by: BERNETA ELSIE LABOR on: 03/10/2024 03:29 PM   Modules accepted: Orders

## 2024-03-14 DIAGNOSIS — M25551 Pain in right hip: Secondary | ICD-10-CM | POA: Diagnosis not present

## 2024-03-15 ENCOUNTER — Encounter: Payer: Self-pay | Admitting: Pulmonary Disease

## 2024-03-15 ENCOUNTER — Ambulatory Visit (INDEPENDENT_AMBULATORY_CARE_PROVIDER_SITE_OTHER): Admitting: Pulmonary Disease

## 2024-03-15 VITALS — BP 112/75 | HR 77 | Temp 97.5°F | Ht 62.0 in | Wt 226.2 lb

## 2024-03-15 DIAGNOSIS — Z87891 Personal history of nicotine dependence: Secondary | ICD-10-CM

## 2024-03-15 DIAGNOSIS — G4710 Hypersomnia, unspecified: Secondary | ICD-10-CM

## 2024-03-15 DIAGNOSIS — R0683 Snoring: Secondary | ICD-10-CM

## 2024-03-15 DIAGNOSIS — R400 Somnolence: Secondary | ICD-10-CM

## 2024-03-15 NOTE — Progress Notes (Signed)
 Natalie [name]    [id number]    04/10/1961  Primary Care Physician:Kremer, Elsie Sayre, MD  Referring Physician: Berneta Elsie Sayre, MD [address] [place],  [place] [address]  Chief complaint:   Patient with a history of snoring  HPI:  Snoring, no witnessed apnea Has some daytime sleepiness  Usually likes to sleep on her side, does not nap on her back a lot of times  She does admit to dryness of her mouth in the mornings  She does have occasional morning headaches but not significant Memory is good Occasional shortness of breath Usually goes to bed between 9 and 11 Takes her about 15 minutes to fall asleep, sometimes takes up to 2 hours, she usually tries to read no significant number of awakenings Final wake up time between 8 and 9 AM  Weight is up about 20 pounds   No family history of sleep apnea  Reformed smoker, quit in 1992  Not sleepy driving  Outpatient Encounter Medications as of 03/15/2024  Medication Sig   aspirin 81 MG tablet Take 81 mg by mouth daily.   betamethasone  dipropionate 0.05 % cream Apply 1 application. topically daily as needed (eczema).   carboxymethylcellulose (REFRESH PLUS) 0.5 % SOLN Place 1 drop into both eyes 3 (three) times daily as needed (dry eyes).   escitalopram  (LEXAPRO ) 5 MG tablet Take 1 tablet (5 mg total) by mouth daily.   HYDROcodone -acetaminophen  (NORCO/VICODIN) 5-325 MG tablet Take 1-2 tablets by mouth every 4 (four) hours as needed.   loratadine  (CLARITIN ) 10 MG tablet Take 10 mg by mouth daily.   meclizine  (ANTIVERT ) 25 MG tablet Take 2 tablets (50 mg total) by mouth 3 (three) times daily as needed for dizziness.   metFORMIN  (GLUCOPHAGE -XR) 500 MG 24 hr tablet Take 1 tablet (500 mg total) by mouth 2 (two) times daily with a meal.   pantoprazole  (PROTONIX ) 40 MG tablet Take 40 mg by mouth daily as needed (indigestion).   pregabalin  (LYRICA ) 225 MG capsule Take 1 capsule (225 mg total) by mouth 2  (two) times daily.   rosuvastatin  (CRESTOR ) 10 MG tablet Take 1 tablet (10 mg total) by mouth daily.   valACYclovir (VALTREX) 500 MG tablet Take 500 mg by mouth 2 (two) times daily as needed (shingles outbreak).   [DISCONTINUED] tirzepatide  (ZEPBOUND ) 2.5 MG/0.5ML Pen Inject 2.5 mg into the skin once a week.   No facility-administered encounter medications on file as of 03/15/2024.    Allergies as of 03/15/2024 - Review Complete 03/15/2024  Allergen Reaction Noted   Lipitor [atorvastatin] Other (See Comments) 09/09/2013    Past Medical History:  Diagnosis Date   Abnormally small mouth    Arthritis    knees   Carpal tunnel syndrome on right 09/2016   Dental crowns present    also dental caps   Family history of adverse reaction to anesthesia    pt's mother and son have hx. of post-op N/V   GERD (gastroesophageal reflux disease)    History of kidney stones    Hx of migraines    Hyperlipidemia    IBS (irritable bowel syndrome)    PONV (postoperative nausea and vomiting)    1 episode only    Past Surgical History:  Procedure Laterality Date   BACK SURGERY     CARPAL TUNNEL RELEASE Right 10/03/2016   Procedure: Right limited open CARPAL TUNNEL RELEASE;  Surgeon: Elsie Mussel, MD;  Location: [facility];  Service: Orthopedics;  Laterality: Right;   CESAREAN SECTION     CHOLECYSTECTOMY  05/27/2016   COLONOSCOPY WITH PROPOFOL   04/20/2015   CYSTO     EYE SURGERY     INCISION / DEBRIDEMENT BONE ELBOW Right 06/14/2003   radial head   KNEE ARTHROSCOPY Left 01/22/2010   MAXIMUM ACCESS (MAS)POSTERIOR LUMBAR INTERBODY FUSION (PLIF) 1 LEVEL     SALPINGOOPHORECTOMY Left 03/31/2002   STERIOD INJECTION Left 10/03/2016   Procedure: STEROID INJECTION;  Surgeon: Elsie Mussel, MD;  Location:  SURGERY CENTER;  Service: Orthopedics;  Laterality: Left;   TOTAL ABDOMINAL HYSTERECTOMY  03/31/2002    Family History  Problem Relation Age of Onset   Anesthesia  problems Mother        post-op N/V   Anesthesia problems Other        post-op N/V    Social History   Socioeconomic History   Marital status: Married    Spouse name: Not on file   Number of children: Not on file   Years of education: Not on file   Highest education level: Associate degree: occupational, Scientist, product/process development, or vocational program  Occupational History   Not on file  Tobacco Use   Smoking status: Never   Smokeless tobacco: Never  Vaping Use   Vaping status: Never Used  Substance and Sexual Activity   Alcohol  use: No    Alcohol /week: 0.0 standard drinks of alcohol    Drug use: No   Sexual activity: Not on file  Other Topics Concern   Not on file  Social History Narrative   Not on file   Social Drivers of Health   Financial Resource Strain: Low Risk  (03/03/2024)   Overall Financial Resource Strain (CARDIA)    Difficulty of Paying Living Expenses: Not very hard  Food Insecurity: No Food Insecurity (03/03/2024)   Hunger Vital Sign    Worried About Running Out of Food in the Last Year: Never true    Ran Out of Food in the Last Year: Never true  Transportation Needs: No Transportation Needs (03/03/2024)   PRAPARE - Administrator, Civil Service (Medical): No    Lack of Transportation (Non-Medical): No  Physical Activity: Inactive (03/03/2024)   Exercise Vital Sign    Days of Exercise per Week: 0 days    Minutes of Exercise per Session: Not on file  Stress: No Stress Concern Present (03/03/2024)   [employer] of Occupational Health - Occupational Stress Questionnaire    Feeling of Stress: Only a little  Social Connections: Moderately Integrated (03/03/2024)   Social Connection and Isolation Panel    Frequency of Communication with Friends and Family: More than three times a week    Frequency of Social Gatherings with Friends and Family: More than three times a week    Attends Religious Services: More than 4 times per year    Active Member of [employer] or  Organizations: No    Attends Engineer, structural: Not on file    Marital Status: Married  Catering manager Violence: Not on file    Review of Systems  Constitutional:  Negative for fatigue.  Respiratory:  Positive for shortness of breath.   Psychiatric/Behavioral:  Positive for sleep disturbance.     Vitals:   03/15/24 1430  BP: 112/75  Pulse: 77  Temp: (!) 97.5 F (36.4 C)  SpO2: 96%     Physical Exam Constitutional:      Appearance: She is obese.  HENT:  Head: Normocephalic.     Nose: Nose normal.     Mouth/Throat:     Mouth: Mucous membranes are moist.  Eyes:     General: No scleral icterus. Cardiovascular:     Rate and Rhythm: Normal rate and regular rhythm.     Heart sounds: No murmur heard.    No friction rub.  Pulmonary:     Effort: No respiratory distress.     Breath sounds: No stridor. No wheezing or rhonchi.  Musculoskeletal:     Cervical back: No rigidity or tenderness.  Neurological:     Mental Status: She is alert.  Psychiatric:        Mood and Affect: Mood normal.       03/15/2024    2:00 PM  Results of the Epworth flowsheet  Sitting and reading 2  Watching TV 1  Sitting, inactive in a public place (e.g. a theatre or a meeting) 1  As a passenger in a car for an hour without a break 3  Lying down to rest in the afternoon when circumstances permit 2  Sitting and talking to someone 0  Sitting quietly after a lunch without alcohol  1  In a car, while stopped for a few minutes in traffic 0  Total score 10    Data Reviewed: No previous sleep study on record  Assessment/Plan: Daytime sleepiness  Snoring  Moderate problems of significant obstructive sleep apnea  Pathophysiology of sleep disordered breathing discussed with the patient Treatment options discussed with the patient  Patient will benefit from having a home sleep study performed  Treatment options including surgical options, CPAP therapy, and oral device as  options of treatment of sleep apnea discussed  Risk with not treating sleep disordered breathing discussed with the patient  Tentative follow-up in about 3 months  Jennet Epley MD  Pulmonary and Critical Care 03/15/2024, 3:17 PM  CC: Berneta Elsie Sayre,*

## 2024-03-15 NOTE — Patient Instructions (Signed)
 We will schedule you for a home sleep test  Continue weight loss efforts  Make sure you are getting enough hours of sleep every night  Graded exercise as tolerated  Tentative follow-up in about 3 months  Call us  with significant concerns

## 2024-03-16 ENCOUNTER — Ambulatory Visit: Attending: Family Medicine

## 2024-03-16 ENCOUNTER — Other Ambulatory Visit: Payer: Self-pay

## 2024-03-16 DIAGNOSIS — M62838 Other muscle spasm: Secondary | ICD-10-CM | POA: Insufficient documentation

## 2024-03-16 DIAGNOSIS — M6281 Muscle weakness (generalized): Secondary | ICD-10-CM | POA: Insufficient documentation

## 2024-03-16 DIAGNOSIS — H8309 Labyrinthitis, unspecified ear: Secondary | ICD-10-CM | POA: Insufficient documentation

## 2024-03-16 DIAGNOSIS — M25551 Pain in right hip: Secondary | ICD-10-CM | POA: Diagnosis not present

## 2024-03-16 DIAGNOSIS — R42 Dizziness and giddiness: Secondary | ICD-10-CM | POA: Insufficient documentation

## 2024-03-16 DIAGNOSIS — M5459 Other low back pain: Secondary | ICD-10-CM | POA: Diagnosis not present

## 2024-03-16 NOTE — Therapy (Signed)
 OUTPATIENT PHYSICAL THERAPY VESTIBULAR EVALUATION     Patient Name: Natalie [name] MRN: [id number] DOB:May 26, 1961, 63 y.o., female Today's Date: 03/16/2024  END OF SESSION:  PT End of Session - 03/16/24 0814     Visit Number 1    Date for PT Re-Evaluation 06/08/24    Authorization Type [employer]    Progress Note Due on Visit 10    PT Start Time 0805    PT Stop Time 0845    PT Time Calculation (min) 40 min    Activity Tolerance Patient tolerated treatment well    Behavior During Therapy [facility] for tasks assessed/performed          Past Medical History:  Diagnosis Date   Abnormally small mouth    Arthritis    knees   Carpal tunnel syndrome on right 09/2016   Dental crowns present    also dental caps   Family history of adverse reaction to anesthesia    pt's mother and son have hx. of post-op N/V   GERD (gastroesophageal reflux disease)    History of kidney stones    Hx of migraines    Hyperlipidemia    IBS (irritable bowel syndrome)    PONV (postoperative nausea and vomiting)    1 episode only   Past Surgical History:  Procedure Laterality Date   BACK SURGERY     CARPAL TUNNEL RELEASE Right 10/03/2016   Procedure: Right limited open CARPAL TUNNEL RELEASE;  Surgeon: Elsie Mussel, MD;  Location: [facility];  Service: Orthopedics;  Laterality: Right;   CESAREAN SECTION     CHOLECYSTECTOMY  05/27/2016   COLONOSCOPY WITH PROPOFOL   04/20/2015   CYSTO     EYE SURGERY     INCISION / DEBRIDEMENT BONE ELBOW Right 06/14/2003   radial head   KNEE ARTHROSCOPY Left 01/22/2010   MAXIMUM ACCESS (MAS)POSTERIOR LUMBAR INTERBODY FUSION (PLIF) 1 LEVEL     SALPINGOOPHORECTOMY Left 03/31/2002   STERIOD INJECTION Left 10/03/2016   Procedure: STEROID INJECTION;  Surgeon: Elsie Mussel, MD;  Location: [facility];  Service: Orthopedics;  Laterality: Left;   TOTAL ABDOMINAL HYSTERECTOMY  03/31/2002   Patient Active Problem List   Diagnosis Date Noted    Localized swelling of left foot 01/22/2024   Elevated cholesterol 12/04/2023   Prediabetes 12/04/2023   Other fatigue 12/04/2023   Snores 12/04/2023   Immunization due 12/04/2023   Dysthymia 12/04/2023   Flank pain 09/28/2023   Atypical chest pain 09/28/2023   Labyrinthitis 03/06/2022   Hospital discharge follow-up 03/06/2022   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   Radiculopathy due to lumbar intervertebral disc disorder 08/06/2021   Back pain of lumbar region with sciatica 07/19/2021   Aortic atherosclerosis (HCC) 10/19/2020   History of nephrolithiasis 10/16/2020   Arthritis of both knees 07/11/2020   Hair loss 01/13/2020   Healthcare maintenance 01/13/2020   Mixed hyperlipidemia 09/22/2017   Migraines 09/22/2017   Morbid obesity due to excess calories (HCC) 05/26/2016   Irritable bowel syndrome with diarrhea 02/06/2015   GERD (gastroesophageal reflux disease) 09/09/2013   Endometriosis 09/09/2013    PCP: Berneta Elsie, MD REFERRING PROVIDER: same  REFERRING DIAG: labarynthitis  THERAPY DIAG:  Dizziness and giddiness  ONSET DATE: 2023, worse last 3 months  Rationale for Evaluation and Treatment: Rehabilitation  SUBJECTIVE:   SUBJECTIVE STATEMENT: Daily dizziness, provoked by position, rolling over, in am when sit up, also fast head movements. Meclizine  doesn't help .  Not dizzy with dimmed lights, etc  has reduced housework and physical activity in response to dizziness over last 2 years.  Also has a separate referral from [facility] for R hip pain which will need PT eval but right now she wants the vertigo settled Pt accompanied by: self  PERTINENT HISTORY: 2 year history of intermittent vertigo  worse in last 3 months  PAIN:  Are you having pain? Pain R hip and B knees chronic  PRECAUTIONS: None  RED FLAGS: None   WEIGHT BEARING RESTRICTIONS: No  FALLS: Has patient fallen in last 6 months? No  LIVING ENVIRONMENT: Lives with: lives with  their spouse Lives in: House/apartment Stairs: indoor steps but she doesn't use  Has following equipment at home: None  PLOF: Independent  PATIENT GOALS: get rid of dizziness  OBJECTIVE:  Note: Objective measures were completed at Evaluation unless otherwise noted.  DIAGNOSTIC FINDINGS: na  COGNITION: Overall cognitive status: Within functional limits for tasks assessed   SENSATION: WFL  EDEMA:  None noted  POSTURE:  forward head  Cervical ROM:  AROM c -spine 80 % for B rotation, otherwise wnl   STRENGTH: wfl x 4  BED MOBILITY:  Supine to sit Min A  GAIT: Gait pattern: decreased stance time- Right Distance walked: in clinic 90 to 100' Assistive device utilized: None Level of assistance: Modified independence Comments: slight decreased stance time and reduce R hip ext terminal stance  FUNCTIONAL TESTS: FGA TBD  PATIENT SURVEYS:  DHI: THE DIZZINESS HANDICAP INVENTORY (DHI)  P1. Does looking up increase your problem? 4 = Yes  E2. Because of your problem, do you feel frustrated? 4 = Yes  F3. Because of your problem, do you restrict your travel for business or recreation?  2 = Sometimes  P4. Does walking down the aisle of a supermarket increase your problems?  0 = No  F5. Because of your problem, do you have difficulty getting into or out of bed?  4 = Yes  F6. Does your problem significantly restrict your participation in social activities, such as going out to dinner, going to the movies, dancing, or going to parties? 2 = Sometimes  F7. Because of your problem, do you have difficulty reading?  0 = No  P8. Does performing more ambitious activities such as sports, dancing, household chores (sweeping or putting dishes away) increase your problems?  2 = Sometimes  E9. Because of your problem, are you afraid to leave your home without having without having someone accompany you?  0 = No  E10. Because of your problem have you been embarrassed in front of others?  0 = No   P11. Do quick movements of your head increase your problem?  2 = Sometimes  F12. Because of your problem, do you avoid heights?  0 = No  P13. Does turning over in bed increase your problem?  4 = Yes  F14. Because of your problem, is it difficult for you to do strenuous homework or yard work? 4 = Yes  E15. Because of your problem, are you afraid people may think you are intoxicated? 0 = No  F16. Because of your problem, is it difficult for you to go for a walk by yourself?  0 = No  P17. Does walking down a sidewalk increase your problem?  0 = No  E18.Because of your problem, is it difficult for you to concentrate 0 = No  F19. Because of your problem, is it difficult for you to walk around your house in the dark?  0 = No  E20. Because of your problem, are you afraid to stay home alone?  0 = No  E21. Because of your problem, do you feel handicapped? 2 = Sometimes  E22. Has the problem placed stress on your relationships with members of your family or friends? 2 = Sometimes  E23. Because of your problem, are you depressed?  0 = No  F24. Does your problem interfere with your job or household responsibilities?  2 = Sometimes  P25. Does bending over increase your problem?  2 = Sometimes  TOTAL 36    DHI Scoring Instructions  The patient is asked to answer each question as it pertains to dizziness or unsteadiness problems, specifically  considering their condition during the last month. Questions are designed to incorporate functional (F), physical  (P), and emotional (E) impacts on disability.   Scores greater than 10 points should be referred to balance specialists for further evaluation.   16-34 Points (mild handicap)  36-52 Points (moderate handicap)  54+ Points (severe handicap)  Minimally Detectable Change: 17 points ([address] [place], 1990)  [place], G. SHAUNNA. and [place], C. W. (1990). The development of the Dizziness Handicap Inventory. Archives of Otolaryngology - Head and Neck  Surgery 116(4): [id number].   VESTIBULAR ASSESSMENT:  GENERAL OBSERVATION: walks with normal gait speed, able to move sit to stand easily, I Dizzy during evaluation requiring CGA with supine to sit each time   SYMPTOM BEHAVIOR:  Subjective history: incidence of vertigo 2 years ago , went to ER.  Since then episodes of vertigo with rolling over in bed  Non-Vestibular symptoms: none  Type of dizziness: Spinning/Vertigo  Frequency: daily  Duration: several seconds  Aggravating factors: Induced by position change: rolling to the left and supine to sit  Relieving factors: head stationary  Progression of symptoms: worse  OCULOMOTOR EXAM:  Ocular Alignment: normal  Ocular ROM: No Limitations  Spontaneous Nystagmus: absent  Gaze-Induced Nystagmus: absent  Smooth Pursuits: intact  Saccades: intact  Convergence/Divergence: 3 cm     VESTIBULAR - OCULAR REFLEX:   Slow VOR: Normal  VOR Cancellation: Normal  Head-Impulse Test: na  Dynamic Visual Acuity: na   POSITIONAL TESTING: Right Dix-Hallpike: no nystagmus Left Dix-Hallpike: no nystagmus and + provocation of Sx Right Roll Test: no nystagmus Left Roll Test: no nystagmus  MOTION SENSITIVITY:  Motion Sensitivity Quotient Intensity: 0 = none, 1 = Lightheaded, 2 = Mild, 3 = Moderate, 4 = Severe, 5 = Vomiting  Intensity  1. Sitting to supine   2. Supine to L side   3. Supine to R side   4. Supine to sitting   5. L Hallpike-Dix   6. Up from L    7. R Hallpike-Dix   8. Up from R    9. Sitting, head tipped to L knee   10. Head up from L knee   11. Sitting, head tipped to R knee   12. Head up from R knee   13. Sitting head turns x5   14.Sitting head nods x5   15. In stance, 180 turn to L    16. In stance, 180 turn to R     OTHOSTATICS: not done  TREATMENT DATE: 03/16/24   Canalith Repositioning:  Epley  Left: Number of Reps: 1 and Response to Treatment: symptoms improved at retesting Gaze Adaptation:  na Habituation:  na Other:   PATIENT EDUCATION: Education details: POC, goals Person educated: Patient Education method: Explanation, Demonstration, Tactile cues, and Verbal cues Education comprehension: verbalized understanding  HOME EXERCISE PROGRAM:  GOALS: Goals reviewed with patient? Yes  SHORT TERM GOALS: Target date: 2 weeks, 03/30/24  I home program  Baseline:TBD Goal status: INITIAL   LONG TERM GOALS: Target date: 06/08/24, 12 weeks  FGA score 27/30 or greater  Baseline: TBD Goal status: INITIAL  2.  DHI improve from 36 to 16 or less Baseline:  Goal status: INITIAL  3.  Consistent resolution of vertigo when rolling in bed  Baseline: caily Sx Goal status: INITIAL  4.  Assess R hip pain and establish goals  Baseline:  Goal status: INITIAL    ASSESSMENT:  CLINICAL IMPRESSION: Patient is a 62 y.o. female who was evaluated  today by physical therapy for vertigo. 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 She responded well today to initial treatment. She may require additional habituation and gaze adaptation ex in future visits as well.  She also has a separate order from orthopedist to eval and treat for R hip pain, so when her vertigo is addressed she will need further evaluation for the hip pain.  OBJECTIVE IMPAIRMENTS: decreased balance, decreased mobility, dizziness, hypomobility, impaired perceived functional ability, and pain.   ACTIVITY LIMITATIONS: carrying, bending, squatting, sleeping, and bed mobility  PARTICIPATION LIMITATIONS: cleaning, laundry, community activity, and yard work  PERSONAL FACTORS: Age, Fitness, Past/current experiences, Time since onset of injury/illness/exacerbation, and 1-2 comorbidities: B knee OA, R TKA are also affecting patient's functional outcome.    REHAB POTENTIAL: Good  CLINICAL DECISION MAKING: Evolving/moderate complexity  EVALUATION COMPLEXITY: Moderate   PLAN:  PT FREQUENCY: 2x/week  PT DURATION: 12 weeks  PLANNED INTERVENTIONS: 97110-Therapeutic exercises, 97530- Therapeutic activity, [id number]- Neuromuscular re-education, 97535- Self Care, [id number]- Manual therapy, [id number]- Canalith repositioning, and Patient/Family education  PLAN FOR NEXT SESSION: reassess for BPPV and treat appropriately.  Progress with assessment of FGA,will evaluate R hip when pt ready and establish additional goals   Leroy Trim L Abem Shaddix, PT, DPT, OCS 03/16/2024, 5:00 PM

## 2024-03-21 ENCOUNTER — Other Ambulatory Visit: Payer: Self-pay | Admitting: Family Medicine

## 2024-03-21 ENCOUNTER — Ambulatory Visit: Admitting: Physical Therapy

## 2024-03-21 ENCOUNTER — Encounter: Payer: Self-pay | Admitting: Physical Therapy

## 2024-03-21 ENCOUNTER — Other Ambulatory Visit: Payer: Self-pay

## 2024-03-21 ENCOUNTER — Other Ambulatory Visit (HOSPITAL_COMMUNITY): Payer: Self-pay

## 2024-03-21 DIAGNOSIS — R7303 Prediabetes: Secondary | ICD-10-CM

## 2024-03-21 DIAGNOSIS — M25551 Pain in right hip: Secondary | ICD-10-CM

## 2024-03-21 DIAGNOSIS — M62838 Other muscle spasm: Secondary | ICD-10-CM

## 2024-03-21 DIAGNOSIS — R42 Dizziness and giddiness: Secondary | ICD-10-CM | POA: Diagnosis not present

## 2024-03-21 DIAGNOSIS — M5459 Other low back pain: Secondary | ICD-10-CM

## 2024-03-21 DIAGNOSIS — M6281 Muscle weakness (generalized): Secondary | ICD-10-CM

## 2024-03-21 MED ORDER — METFORMIN HCL ER 500 MG PO TB24
500.0000 mg | ORAL_TABLET | Freq: Two times a day (BID) | ORAL | 1 refills | Status: DC
Start: 2024-03-21 — End: 2024-06-06
  Filled 2024-03-21: qty 180, 90d supply, fill #0

## 2024-03-21 NOTE — Therapy (Signed)
 OUTPATIENT PHYSICAL THERAPY VESTIBULAR EVALUATION     Patient Name: Natalie [name] MRN: [id number] DOB:[birth year], 63 y.o., female Today's Date: 03/21/2024  END OF SESSION:  PT End of Session - 03/21/24 0843     Visit Number 2    Date for PT Re-Evaluation 06/08/24    Authorization Type [employer]    PT Start Time [phone]    PT Stop Time 0929    PT Time Calculation (min) 45 min    Activity Tolerance Patient tolerated treatment well    Behavior During Therapy [facility] for tasks assessed/performed          Past Medical History:  Diagnosis Date   Abnormally small mouth    Arthritis    knees   Carpal tunnel syndrome on right 09/2016   Dental crowns present    also dental caps   Family history of adverse reaction to anesthesia    pt's mother and son have hx. of post-op N/V   GERD (gastroesophageal reflux disease)    History of kidney stones    Hx of migraines    Hyperlipidemia    IBS (irritable bowel syndrome)    PONV (postoperative nausea and vomiting)    1 episode only   Past Surgical History:  Procedure Laterality Date   BACK SURGERY     CARPAL TUNNEL RELEASE Right 10/03/2016   Procedure: Right limited open CARPAL TUNNEL RELEASE;  Surgeon: Elsie Mussel, MD;  Location: [facility];  Service: Orthopedics;  Laterality: Right;   CESAREAN SECTION     CHOLECYSTECTOMY  05/27/2016   COLONOSCOPY WITH PROPOFOL   04/20/2015   CYSTO     EYE SURGERY     INCISION / DEBRIDEMENT BONE ELBOW Right 06/14/2003   radial head   KNEE ARTHROSCOPY Left 01/22/2010   MAXIMUM ACCESS (MAS)POSTERIOR LUMBAR INTERBODY FUSION (PLIF) 1 LEVEL     SALPINGOOPHORECTOMY Left 03/31/2002   STERIOD INJECTION Left 10/03/2016   Procedure: STEROID INJECTION;  Surgeon: Elsie Mussel, MD;  Location: [facility];  Service: Orthopedics;  Laterality: Left;   TOTAL ABDOMINAL HYSTERECTOMY  03/31/2002   Patient Active Problem List   Diagnosis Date Noted   Localized swelling of left foot  01/22/2024   Elevated cholesterol 12/04/2023   Prediabetes 12/04/2023   Other fatigue 12/04/2023   Snores 12/04/2023   Immunization due 12/04/2023   Dysthymia 12/04/2023   Flank pain 09/28/2023   Atypical chest pain 09/28/2023   Labyrinthitis 03/06/2022   Hospital discharge follow-up 03/06/2022   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   Radiculopathy due to lumbar intervertebral disc disorder 08/06/2021   Back pain of lumbar region with sciatica 07/19/2021   Aortic atherosclerosis (HCC) 10/19/2020   History of nephrolithiasis 10/16/2020   Arthritis of both knees 07/11/2020   Hair loss 01/13/2020   Healthcare maintenance 01/13/2020   Mixed hyperlipidemia 09/22/2017   Migraines 09/22/2017   Morbid obesity due to excess calories (HCC) 05/26/2016   Irritable bowel syndrome with diarrhea 02/06/2015   GERD (gastroesophageal reflux disease) 09/09/2013   Endometriosis 09/09/2013    PCP: Berneta Elsie, MD REFERRING PROVIDER: same  REFERRING DIAG: labarynthitis  THERAPY DIAG:  Dizziness and giddiness  Pain in right hip  Other muscle spasm  Muscle weakness (generalized)  Other low back pain  ONSET DATE: 2023, worse last 3 months  Rationale for Evaluation and Treatment: Rehabilitation  SUBJECTIVE:   SUBJECTIVE STATEMENT: Patient reports that the Epley maneuver really helped, much less dizziness.  Right hip and flank pain are  an issue, unsure of a cause, reports that she cannot be on the right side  Daily dizziness, provoked by position, rolling over, in am when sit up, also fast head movements. Meclizine  doesn't help .  Not dizzy with dimmed lights, etc  has reduced housework and physical activity in response to dizziness over last 2 years.  Also has a separate referral from [facility] for R hip pain which will need PT eval but right now she wants the vertigo settled Pt accompanied by: self  PERTINENT HISTORY: 2 year history of intermittent vertigo  worse in  last 3 months  PAIN:  Are you having pain? Pain R hip and B knees chronic  PRECAUTIONS: None  RED FLAGS: None   WEIGHT BEARING RESTRICTIONS: No  FALLS: Has patient fallen in last 6 months? No  LIVING ENVIRONMENT: Lives with: lives with their spouse Lives in: House/apartment Stairs: indoor steps but she doesn't use  Has following equipment at home: None  PLOF: Independent  PATIENT GOALS: get rid of dizziness  OBJECTIVE:  Note: Objective measures were completed at Evaluation unless otherwise noted.  DIAGNOSTIC FINDINGS: na  COGNITION: Overall cognitive status: Within functional limits for tasks assessed   SENSATION: WFL  EDEMA:  None noted  POSTURE:  forward head  Cervical ROM:  AROM c -spine 80 % for B rotation, otherwise wnl   STRENGTH: wfl x 4  BED MOBILITY:  Supine to sit Min A  GAIT: Gait pattern: decreased stance time- Right Distance walked: in clinic 90 to 100' Assistive device utilized: None Level of assistance: Modified independence Comments: slight decreased stance time and reduce R hip ext terminal stance  FUNCTIONAL TESTS: FGA TBD  PATIENT SURVEYS:  DHI: THE DIZZINESS HANDICAP INVENTORY (DHI)  P1. Does looking up increase your problem? 4 = Yes  E2. Because of your problem, do you feel frustrated? 4 = Yes  F3. Because of your problem, do you restrict your travel for business or recreation?  2 = Sometimes  P4. Does walking down the aisle of a supermarket increase your problems?  0 = No  F5. Because of your problem, do you have difficulty getting into or out of bed?  4 = Yes  F6. Does your problem significantly restrict your participation in social activities, such as going out to dinner, going to the movies, dancing, or going to parties? 2 = Sometimes  F7. Because of your problem, do you have difficulty reading?  0 = No  P8. Does performing more ambitious activities such as sports, dancing, household chores (sweeping or putting dishes away)  increase your problems?  2 = Sometimes  E9. Because of your problem, are you afraid to leave your home without having without having someone accompany you?  0 = No  E10. Because of your problem have you been embarrassed in front of others?  0 = No  P11. Do quick movements of your head increase your problem?  2 = Sometimes  F12. Because of your problem, do you avoid heights?  0 = No  P13. Does turning over in bed increase your problem?  4 = Yes  F14. Because of your problem, is it difficult for you to do strenuous homework or yard work? 4 = Yes  E15. Because of your problem, are you afraid people may think you are intoxicated? 0 = No  F16. Because of your problem, is it difficult for you to go for a walk by yourself?  0 = No  P17. Does walking  down a sidewalk increase your problem?  0 = No  E18.Because of your problem, is it difficult for you to concentrate 0 = No  F19. Because of your problem, is it difficult for you to walk around your house in the dark? 0 = No  E20. Because of your problem, are you afraid to stay home alone?  0 = No  E21. Because of your problem, do you feel handicapped? 2 = Sometimes  E22. Has the problem placed stress on your relationships with members of your family or friends? 2 = Sometimes  E23. Because of your problem, are you depressed?  0 = No  F24. Does your problem interfere with your job or household responsibilities?  2 = Sometimes  P25. Does bending over increase your problem?  2 = Sometimes  TOTAL 36    DHI Scoring Instructions  The patient is asked to answer each question as it pertains to dizziness or unsteadiness problems, specifically  considering their condition during the last month. Questions are designed to incorporate functional (F), physical  (P), and emotional (E) impacts on disability.   Scores greater than 10 points should be referred to balance specialists for further evaluation.   16-34 Points (mild handicap)  36-52 Points (moderate  handicap)  54+ Points (severe handicap)  Minimally Detectable Change: 17 points ([address] [place], 1990)  Naselle, G. SHAUNNA. and Oakes, C. W. (1990). The development of the Dizziness Handicap Inventory. Archives of Otolaryngology - Head and Neck Surgery 116(4): [id number].   VESTIBULAR ASSESSMENT:  GENERAL OBSERVATION: walks with normal gait speed, able to move sit to stand easily, I Dizzy during evaluation requiring CGA with supine to sit each time   SYMPTOM BEHAVIOR:  Subjective history: incidence of vertigo 2 years ago , went to ER.  Since then episodes of vertigo with rolling over in bed  Non-Vestibular symptoms: none  Type of dizziness: Spinning/Vertigo  Frequency: daily  Duration: several seconds  Aggravating factors: Induced by position change: rolling to the left and supine to sit  Relieving factors: head stationary  Progression of symptoms: worse  OCULOMOTOR EXAM:  Ocular Alignment: normal  Ocular ROM: No Limitations  Spontaneous Nystagmus: absent  Gaze-Induced Nystagmus: absent  Smooth Pursuits: intact  Saccades: intact  Convergence/Divergence: 3 cm     VESTIBULAR - OCULAR REFLEX:   Slow VOR: Normal  VOR Cancellation: Normal  Head-Impulse Test: na  Dynamic Visual Acuity: na   POSITIONAL TESTING: Right Dix-Hallpike: no nystagmus Left Dix-Hallpike: no nystagmus and + provocation of Sx Right Roll Test: no nystagmus Left Roll Test: no nystagmus  MOTION SENSITIVITY:  Motion Sensitivity Quotient Intensity: 0 = none, 1 = Lightheaded, 2 = Mild, 3 = Moderate, 4 = Severe, 5 = Vomiting  Intensity  1. Sitting to supine   2. Supine to L side   3. Supine to R side   4. Supine to sitting   5. L Hallpike-Dix   6. Up from L    7. R Hallpike-Dix   8. Up from R    9. Sitting, head tipped to L knee   10. Head up from L knee   11. Sitting, head tipped to R knee   12. Head up from R knee   13. Sitting head turns x5   14.Sitting head nods x5   15. In stance, 180 turn  to L    16. In stance, 180 turn to R     OTHOSTATICS: not done  03/21/24 Hip:  tight ROM with  ER, MMT 4-/5, she is non tender in the right hip, the tenderness is in the right flank area posterior along the right ribs from 9-12, no pain with deep breath, pain with resisted breathing, she is very tight in the mms of the paraspinals                                                                                                                           TREATMENT DATE:  03/21/24 Self care:  Reviewed Epley an how it works with anatomy, went over self Eply and after care gave handouts.  Went over and performed low function VOR exercises and gave as HEP.  WEnt over hip/back pain and assessed this, then gave her HEP for this STM to the right flank, buttock and lumbar and thoracic paraspinals   03/16/24   Canalith Repositioning:  Epley Left: Number of Reps: 1 and Response to Treatment: symptoms improved at retesting Gaze Adaptation:  na Habituation:  na Other:   PATIENT EDUCATION: Education details: POC, goals Person educated: Patient Education method: Explanation, Demonstration, Tactile cues, and Verbal cues Education comprehension: verbalized understanding  HOME EXERCISE PROGRAM: Access Code: [email or web address] URL: [email or web address] Date: 03/21/2024 Prepared by: Ozell Mainland  Exercises - Thoracic Extension Mobilization on Foam Roll  - 1 x daily - 7 x weekly - 1 reps - 30 hold - Seated Scapular Retraction  - 1 x daily - 7 x weekly - 1 sets - 10 reps - 3 hold - Seated Sidebending  - 1 x daily - 7 x weekly - 1 sets - 10 reps - 5 hold Gave Epley self handout and low VOR exercises  GOALS: Goals reviewed with patient? Yes  SHORT TERM GOALS: Target date: 2 weeks, 03/30/24  I home program  Baseline:TBD Goal status:03/21/24 initiated   LONG TERM GOALS: Target date: 06/08/24, 12 weeks  FGA score 27/30 or greater  Baseline: TBD Goal status: INITIAL  2.  DHI improve  from 36 to 16 or less Baseline:  Goal status: INITIAL  3.  Consistent resolution of vertigo when rolling in bed  Baseline: caily Sx Goal status: INITIAL  4.  Assess R hip pain and establish goals  Baseline:  Goal status:met 03/21/24 5.  Decrease pain in the right flank/back/hip 50%    ASSESSMENT:  CLINICAL IMPRESSION: Patient reports that after the Epley maneuver her dizziness/vertigo has been much better.  We assessed the right hip today, however she reports it is not the hip, she is more tender and sore in the right flank area, very tight in the thoracic and lumbar paraspinals.  She can take a deep breath but has pain with resisted breathing.  Gave HEP for some gentle motions, tried the [place] and told her how to do.  Feel like it could be a costovertebral issue vs all mm spasms, the hips are weak, has tightness in the HS and piriformis  Patient is a 63 y.o. female who was evaluated  today by  physical therapy for vertigo. 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 She responded well today to initial treatment. She may require additional habituation and gaze adaptation ex in future visits as well.  She also has a separate order from orthopedist to eval and treat for R hip pain, so when her vertigo is addressed she will need further evaluation for the hip pain.  OBJECTIVE IMPAIRMENTS: decreased balance, decreased mobility, dizziness, hypomobility, impaired perceived functional ability, and pain.   ACTIVITY LIMITATIONS: carrying, bending, squatting, sleeping, and bed mobility  PARTICIPATION LIMITATIONS: cleaning, laundry, community activity, and yard work  PERSONAL FACTORS: Age, Fitness, Past/current experiences, Time since onset of injury/illness/exacerbation, and 1-2 comorbidities: B knee OA, R TKA are also affecting patient's functional outcome.   REHAB POTENTIAL: Good  CLINICAL DECISION MAKING: Evolving/moderate  complexity  EVALUATION COMPLEXITY: Moderate   PLAN:  PT FREQUENCY: 2x/week  PT DURATION: 12 weeks  PLANNED INTERVENTIONS: 97110-Therapeutic exercises, 97530- Therapeutic activity, [id number]- Neuromuscular re-education, 97535- Self Care, 02859- Manual therapy, 95992- Canalith repositioning, and Patient/Family education  PLAN FOR NEXT SESSION: reassess for BPPV and treat appropriately.  Progress with assessment of FGA,   Zahara Rembert W, PT,  03/21/2024, 8:44 AM

## 2024-03-21 NOTE — Telephone Encounter (Signed)
 Copied from CRM #[id number]. Topic: Clinical - Medication Refill >> Mar 21, 2024 10:18 AM Chiquita SQUIBB wrote: Medication: metFORMIN  (GLUCOPHAGE -XR) 500 MG 24 hr tablet [[id number]]  Has the patient contacted their pharmacy? Yes (Agent: If no, request that the patient contact the pharmacy for the refill. If patient does not wish to contact the pharmacy document the reason why and proceed with request.) (Agent: If yes, when and what did the pharmacy advise?)  This is the patient's preferred pharmacy:  [facility] [address] [place] [address] Phone: [phone] Fax: [phone]  Is this the correct pharmacy for this prescription? No If no, delete pharmacy and type the correct one.   Has the prescription been filled recently? No  Is the patient out of the medication? No  Has the patient been seen for an appointment in the last year OR does the patient have an upcoming appointment? Yes  Can we respond through MyChart? Yes  Agent: Please be advised that Rx refills may take up to 3 business days. We ask that you follow-up with your pharmacy.

## 2024-03-22 ENCOUNTER — Other Ambulatory Visit: Payer: Self-pay

## 2024-03-22 ENCOUNTER — Other Ambulatory Visit (HOSPITAL_COMMUNITY): Payer: Self-pay

## 2024-03-30 ENCOUNTER — Ambulatory Visit: Admitting: Physical Therapy

## 2024-04-11 ENCOUNTER — Ambulatory Visit

## 2024-04-13 ENCOUNTER — Other Ambulatory Visit (HOSPITAL_COMMUNITY): Payer: Self-pay

## 2024-04-13 DIAGNOSIS — Z1231 Encounter for screening mammogram for malignant neoplasm of breast: Secondary | ICD-10-CM | POA: Diagnosis not present

## 2024-04-13 DIAGNOSIS — N952 Postmenopausal atrophic vaginitis: Secondary | ICD-10-CM | POA: Diagnosis not present

## 2024-04-13 DIAGNOSIS — Z01419 Encounter for gynecological examination (general) (routine) without abnormal findings: Secondary | ICD-10-CM | POA: Diagnosis not present

## 2024-04-13 DIAGNOSIS — Z6841 Body Mass Index (BMI) 40.0 and over, adult: Secondary | ICD-10-CM | POA: Diagnosis not present

## 2024-04-13 MED ORDER — HYDROCODONE-ACETAMINOPHEN 5-325 MG PO TABS
1.0000 | ORAL_TABLET | Freq: Four times a day (QID) | ORAL | 0 refills | Status: DC | PRN
  Filled 2024-04-13: qty 50, 13d supply, fill #0

## 2024-04-14 ENCOUNTER — Ambulatory Visit: Admitting: Physical Therapy

## 2024-04-14 ENCOUNTER — Encounter

## 2024-04-14 ENCOUNTER — Other Ambulatory Visit (HOSPITAL_COMMUNITY): Payer: Self-pay

## 2024-04-14 DIAGNOSIS — R400 Somnolence: Secondary | ICD-10-CM

## 2024-04-14 DIAGNOSIS — R0683 Snoring: Secondary | ICD-10-CM

## 2024-04-14 DIAGNOSIS — G4730 Sleep apnea, unspecified: Secondary | ICD-10-CM | POA: Diagnosis not present

## 2024-04-14 MED ORDER — PREGABALIN 225 MG PO CAPS
225.0000 mg | ORAL_CAPSULE | Freq: Two times a day (BID) | ORAL | 2 refills | Status: AC
  Filled 2024-04-14 – 2024-04-15 (×6): qty 60, 30d supply, fill #0
  Filled 2024-04-15: qty 10, 5d supply, fill #0
  Filled 2024-04-20 – 2024-04-25 (×3): qty 60, 30d supply, fill #0
  Filled 2024-05-23: qty 60, 30d supply, fill #1
  Filled 2024-06-18: qty 60, 30d supply, fill #2

## 2024-04-15 ENCOUNTER — Other Ambulatory Visit: Payer: Self-pay

## 2024-04-15 ENCOUNTER — Other Ambulatory Visit (HOSPITAL_BASED_OUTPATIENT_CLINIC_OR_DEPARTMENT_OTHER): Payer: Self-pay

## 2024-04-15 ENCOUNTER — Other Ambulatory Visit (HOSPITAL_COMMUNITY): Payer: Self-pay

## 2024-04-15 MED ORDER — PREGABALIN 225 MG PO CAPS
225.0000 mg | ORAL_CAPSULE | Freq: Two times a day (BID) | ORAL | 2 refills | Status: AC
  Filled 2024-04-15 (×2): qty 5, 3d supply, fill #0
  Filled 2024-04-15: qty 5, 2d supply, fill #0
  Filled 2024-05-19: qty 5, 3d supply, fill #1

## 2024-04-18 ENCOUNTER — Ambulatory Visit

## 2024-04-20 ENCOUNTER — Other Ambulatory Visit (HOSPITAL_BASED_OUTPATIENT_CLINIC_OR_DEPARTMENT_OTHER): Payer: Self-pay

## 2024-04-21 ENCOUNTER — Other Ambulatory Visit (HOSPITAL_BASED_OUTPATIENT_CLINIC_OR_DEPARTMENT_OTHER): Payer: Self-pay

## 2024-04-21 ENCOUNTER — Ambulatory Visit: Admitting: Physical Therapy

## 2024-04-24 ENCOUNTER — Other Ambulatory Visit (HOSPITAL_COMMUNITY): Payer: Self-pay

## 2024-04-24 ENCOUNTER — Other Ambulatory Visit (HOSPITAL_BASED_OUTPATIENT_CLINIC_OR_DEPARTMENT_OTHER): Payer: Self-pay

## 2024-04-25 ENCOUNTER — Other Ambulatory Visit (HOSPITAL_BASED_OUTPATIENT_CLINIC_OR_DEPARTMENT_OTHER): Payer: Self-pay

## 2024-04-25 ENCOUNTER — Other Ambulatory Visit: Payer: Self-pay

## 2024-04-25 ENCOUNTER — Other Ambulatory Visit (HOSPITAL_COMMUNITY): Payer: Self-pay

## 2024-05-02 ENCOUNTER — Telehealth: Payer: Self-pay | Admitting: Pulmonary Disease

## 2024-05-02 DIAGNOSIS — G4733 Obstructive sleep apnea (adult) (pediatric): Secondary | ICD-10-CM | POA: Diagnosis not present

## 2024-05-02 DIAGNOSIS — R0683 Snoring: Secondary | ICD-10-CM

## 2024-05-02 DIAGNOSIS — E66813 Obesity, class 3: Secondary | ICD-10-CM

## 2024-05-02 NOTE — Telephone Encounter (Signed)
 Call patient  Sleep study result  Date of study: 04/14/2024  Impression: Severe obstructive sleep apnea with moderately severe oxygen desaturations AHI of 52.1 with O2 nadir of 78% Oxygen was below 88% for 7 minutes  Recommendation: DME referral  Recommend CPAP therapy for severe obstructive sleep apnea.  Auto-titrating CPAP with pressure settings of 5-15 should be appropriate, along with heated humidification using the patient's preferred mask.  Encourage weight loss measures.  Schedule follow-up in the office 4 to 6 weeks after initiation. of treatment

## 2024-05-02 NOTE — Telephone Encounter (Signed)
 Called and spoke with the pt. She would like to start CPAP therapy.  Order has been placed.

## 2024-05-17 NOTE — Telephone Encounter (Signed)
 Patient is calling because she say she was wanting to know the process about the order that was placed . Did relay to patient the information for the dme company and gave patient the number and told her to reach out to them because the order was placed . Patient says ok and if she is needing to do anything else to please reach out to her  [phone]

## 2024-05-19 ENCOUNTER — Other Ambulatory Visit (HOSPITAL_COMMUNITY): Payer: Self-pay

## 2024-05-19 ENCOUNTER — Other Ambulatory Visit (HOSPITAL_BASED_OUTPATIENT_CLINIC_OR_DEPARTMENT_OTHER): Payer: Self-pay

## 2024-05-19 MED ORDER — ESCITALOPRAM OXALATE 5 MG PO TABS
5.0000 mg | ORAL_TABLET | Freq: Every day | ORAL | 3 refills | Status: AC
  Filled 2024-05-19: qty 90, 90d supply, fill #0
  Filled 2024-08-20: qty 90, 90d supply, fill #1

## 2024-05-23 ENCOUNTER — Other Ambulatory Visit (HOSPITAL_BASED_OUTPATIENT_CLINIC_OR_DEPARTMENT_OTHER): Payer: Self-pay

## 2024-05-23 ENCOUNTER — Other Ambulatory Visit (HOSPITAL_COMMUNITY): Payer: Self-pay

## 2024-06-06 ENCOUNTER — Encounter: Payer: Self-pay | Admitting: Family Medicine

## 2024-06-06 ENCOUNTER — Ambulatory Visit: Admitting: Family Medicine

## 2024-06-06 ENCOUNTER — Ambulatory Visit (INDEPENDENT_AMBULATORY_CARE_PROVIDER_SITE_OTHER): Admitting: Family Medicine

## 2024-06-06 ENCOUNTER — Other Ambulatory Visit (HOSPITAL_COMMUNITY): Payer: Self-pay

## 2024-06-06 VITALS — BP 118/70 | HR 67 | Temp 97.8°F | Ht 62.0 in | Wt 222.6 lb

## 2024-06-06 DIAGNOSIS — E7800 Pure hypercholesterolemia, unspecified: Secondary | ICD-10-CM | POA: Diagnosis not present

## 2024-06-06 DIAGNOSIS — R7303 Prediabetes: Secondary | ICD-10-CM

## 2024-06-06 DIAGNOSIS — G4733 Obstructive sleep apnea (adult) (pediatric): Secondary | ICD-10-CM | POA: Diagnosis not present

## 2024-06-06 MED ORDER — METFORMIN HCL ER 500 MG PO TB24
500.0000 mg | ORAL_TABLET | Freq: Two times a day (BID) | ORAL | 1 refills | Status: AC
  Filled 2024-06-06: qty 180, 90d supply, fill #0

## 2024-06-06 MED ORDER — TIRZEPATIDE-WEIGHT MANAGEMENT 2.5 MG/0.5ML ~~LOC~~ SOAJ
SUBCUTANEOUS | 0 refills | Status: AC
  Filled 2024-06-06: qty 2, 28d supply, fill #0

## 2024-06-06 MED ORDER — ROSUVASTATIN CALCIUM 10 MG PO TABS
10.0000 mg | ORAL_TABLET | Freq: Every day | ORAL | 3 refills | Status: AC
  Filled 2024-06-06 – 2024-08-20 (×2): qty 90, 90d supply, fill #0

## 2024-06-06 NOTE — Progress Notes (Signed)
 Established Patient Office Visit   Subjective:  Patient ID: Natalie [name], female    DOB: [birth year]  Age: 63 y.o. MRN: [id number]  No chief complaint on file.   HPI Encounter Diagnoses  Name Primary?   Obstructive sleep apnea syndrome Yes   Prediabetes    Morbid obesity due to excess calories (HCC)    Elevated cholesterol    Follow-up of above.  Recently diagnosed with apnea.  She is using her machine daily.  She is exercising by walking.  She has stopped snacking and been able to lose some weight.  Continues rosuvastatin  for elevated cholesterol.   Review of Systems  Constitutional: Negative.   HENT: Negative.    Eyes:  Negative for blurred vision, discharge and redness.  Respiratory: Negative.    Cardiovascular: Negative.   Gastrointestinal:  Negative for abdominal pain.  Genitourinary: Negative.   Musculoskeletal: Negative.  Negative for myalgias.  Skin:  Negative for rash.  Neurological:  Negative for tingling, loss of consciousness and weakness.  Endo/Heme/Allergies:  Negative for polydipsia.      06/06/2024    9:32 AM 06/06/2024    9:24 AM 03/04/2024    9:34 AM  Depression screen PHQ 2/9  Decreased Interest 0 0 0  Down, Depressed, Hopeless 0 0 0  PHQ - 2 Score 0 0 0  Altered sleeping  1   Tired, decreased energy  1   Change in appetite  0   Feeling bad or failure about yourself   0   Trouble concentrating  0   Moving slowly or fidgety/restless  0   Suicidal thoughts  0   PHQ-9 Score  2   Difficult doing work/chores  Not difficult at all       Current Outpatient Medications:    tirzepatide  (ZEPBOUND ) 2.5 MG/0.5ML injection vial, Inject 2.5 mg into the skin once a week for 28 days, THEN 5 mg once a week., Disp: 10 mL, Rfl: 0   aspirin 81 MG tablet, Take 81 mg by mouth daily., Disp: , Rfl:    betamethasone  dipropionate 0.05 % cream, Apply 1 application. topically daily as needed (eczema)., Disp: , Rfl:    carboxymethylcellulose (REFRESH PLUS) 0.5 % SOLN,  Place 1 drop into both eyes 3 (three) times daily as needed (dry eyes)., Disp: , Rfl:    escitalopram  (LEXAPRO ) 5 MG tablet, Take 1 tablet (5 mg total) by mouth daily., Disp: 90 tablet, Rfl: 3   HYDROcodone -acetaminophen  (NORCO/VICODIN) 5-325 MG tablet, Take 1-2 tablets by mouth every 4 (four) hours as needed., Disp: , Rfl:    loratadine  (CLARITIN ) 10 MG tablet, Take 10 mg by mouth daily., Disp: , Rfl:    meclizine  (ANTIVERT ) 25 MG tablet, Take 2 tablets (50 mg total) by mouth 3 (three) times daily as needed for dizziness., Disp: 60 tablet, Rfl: 0   metFORMIN  (GLUCOPHAGE -XR) 500 MG 24 hr tablet, Take 1 tablet (500 mg total) by mouth 2 (two) times daily with a meal., Disp: 180 tablet, Rfl: 1   pantoprazole  (PROTONIX ) 40 MG tablet, Take 40 mg by mouth daily as needed (indigestion)., Disp: , Rfl:    pregabalin  (LYRICA ) 225 MG capsule, Take 1 capsule (225 mg total) by mouth 2 (two) times daily., Disp: 60 capsule, Rfl: 2   pregabalin  (LYRICA ) 225 MG capsule, Take 1 capsule (225 mg total) by mouth 2 (two) times daily., Disp: 5 capsule, Rfl: 2   rosuvastatin  (CRESTOR ) 10 MG tablet, Take 1 tablet (10 mg total) by mouth daily.,  Disp: 90 tablet, Rfl: 3   valACYclovir (VALTREX) 500 MG tablet, Take 500 mg by mouth 2 (two) times daily as needed (shingles outbreak)., Disp: , Rfl:    Objective:     BP 118/70 (BP Location: Right Arm, Patient Position: Sitting, Cuff Size: Large)   Pulse 67   Temp 97.8 F (36.6 C) (Temporal)   Ht 5' 2 (1.575 m)   Wt 222 lb 9.6 oz (101 kg)   SpO2 98%   BMI 40.71 kg/m  Wt Readings from Last 3 Encounters:  06/06/24 222 lb 9.6 oz (101 kg)  03/15/24 226 lb 3.2 oz (102.6 kg)  03/04/24 226 lb 3.2 oz (102.6 kg)      Physical Exam Constitutional:      General: She is not in acute distress.    Appearance: Normal appearance. She is not ill-appearing, toxic-appearing or diaphoretic.  HENT:     Head: Normocephalic and atraumatic.     Right Ear: External ear normal.     Left  Ear: External ear normal.  Eyes:     General: No scleral icterus.       Right eye: No discharge.        Left eye: No discharge.     Extraocular Movements: Extraocular movements intact.     Conjunctiva/sclera: Conjunctivae normal.  Pulmonary:     Effort: Pulmonary effort is normal. No respiratory distress.  Skin:    General: Skin is warm and dry.  Neurological:     Mental Status: She is alert and oriented to person, place, and time.  Psychiatric:        Mood and Affect: Mood normal.        Behavior: Behavior normal.      No results found for any visits on 06/06/24.    The 10-year ASCVD risk score (Arnett DK, et al., 2019) is: 4.6%    Assessment & Plan:   Obstructive sleep apnea syndrome -     Tirzepatide -Weight Management; Inject 2.5 mg into the skin once a week for 28 days, THEN 5 mg once a week.  Dispense: 10 mL; Refill: 0  Prediabetes -     metFORMIN  HCl ER; Take 1 tablet (500 mg total) by mouth 2 (two) times daily with a meal.  Dispense: 180 tablet; Refill: 1  Morbid obesity due to excess calories (HCC)  Elevated cholesterol -     Rosuvastatin  Calcium ; Take 1 tablet (10 mg total) by mouth daily.  Dispense: 90 tablet; Refill: 3    Return in about 3 months (around 09/06/2024).    Elsie Sim Lent, MD

## 2024-06-07 ENCOUNTER — Other Ambulatory Visit (HOSPITAL_COMMUNITY): Payer: Self-pay

## 2024-06-20 ENCOUNTER — Other Ambulatory Visit (HOSPITAL_BASED_OUTPATIENT_CLINIC_OR_DEPARTMENT_OTHER): Payer: Self-pay

## 2024-06-20 ENCOUNTER — Other Ambulatory Visit: Payer: Self-pay

## 2024-06-20 ENCOUNTER — Other Ambulatory Visit (HOSPITAL_COMMUNITY): Payer: Self-pay

## 2024-06-21 ENCOUNTER — Other Ambulatory Visit (HOSPITAL_COMMUNITY): Payer: Self-pay

## 2024-06-21 ENCOUNTER — Other Ambulatory Visit (HOSPITAL_BASED_OUTPATIENT_CLINIC_OR_DEPARTMENT_OTHER): Payer: Self-pay

## 2024-06-21 MED ORDER — PREGABALIN 225 MG PO CAPS
225.0000 mg | ORAL_CAPSULE | Freq: Two times a day (BID) | ORAL | 2 refills | Status: AC
  Filled 2024-06-21: qty 60, 30d supply, fill #0
  Filled 2024-07-20: qty 60, 30d supply, fill #1
  Filled 2024-08-20: qty 60, 30d supply, fill #2

## 2024-07-02 DIAGNOSIS — G4733 Obstructive sleep apnea (adult) (pediatric): Secondary | ICD-10-CM | POA: Diagnosis not present

## 2024-07-04 ENCOUNTER — Ambulatory Visit: Payer: Self-pay

## 2024-07-04 NOTE — Telephone Encounter (Signed)
 FYI Only or Action Required?: Action required by provider: request for documentation or forms.  Patient was last seen in primary care on 06/06/2024 by Berneta Elsie Sayre, MD.  Called Nurse Triage reporting Release of Information.  Symptoms began additional info requested for prior auth of Zepbound .  Interventions attempted: Other: fax clinic.  Symptoms are: n/a.  Triage Disposition: Call PCP Within 24 Hours  Patient/caregiver understands and will follow disposition?: No, wishes to speak with PCP   Copied from CRM #[id number]. Topic: General - Other >> Jul 04, 2024 11:31 AM Ahlexyia S wrote: Reason for CRM: Sonu with [employer] calling in to get clinical information about the pt. Warm transferred to nurse triage. Reason for Disposition  [1] Caller requests to speak ONLY to PCP AND [2] NON-URGENT question  Answer Assessment - Initial Assessment Questions [facility] [employer] requesting additional information for prior auth of Zepbound . See below for requested information. Requests fax to [phone]   1) Diagnosis code for medication Zepbound   2) List of all medication tried and failed for same diagnosis code as Zepbound  3) If dx code is for OSA they require for pcp to submit chart notes and APNEA index score and documentation of sleep study completed.      1. REASON FOR CALL or QUESTION: What is your reason for calling today? or How can I best     Additional info needed for Zepbound  prior auth. Confirm fax and office phone numbers-provided fax and phone number 2. CALLER: Document the source of call. (e.g., laboratory staff, caregiver or patient).     Sonu from [employer].  Protocols used: PCP Call - No Triage-A-AH

## 2024-07-06 NOTE — Telephone Encounter (Signed)
 Received documentation request for Zepbound .  Faxed back with las OV note, and sleep study to [phone]. Dm/cma

## 2024-07-08 ENCOUNTER — Telehealth: Payer: Self-pay

## 2024-07-08 NOTE — Progress Notes (Signed)
° °  07/08/2024  Patient ID: Natalie [name], female   DOB: Dec 16, 1960, 63 y.o.   MRN: [id number]  This patient is appearing on a report for being at risk of failing the adherence measure for diabetes medications this calendar year.   Medication: metformin  500mg  Last fill date: 06/22/24 for 90 day supply  Insurance report was not up to date. No action needed at this time.   Channing DELENA Mealing, PharmD, DPLA

## 2024-07-08 NOTE — Telephone Encounter (Signed)
 Copied from CRM #[id number]. Topic: Clinical - Medical Advice >> Jul 06, 2024 12:12 PM Isabell A wrote: Reason for CRM: Patient was told if her CPAP machine number is over 5 to call - requesting a call back after 3pm.   Callback number: [phone]   Called pt. Pt states that her CPAP is showing that her episodes per hour is between 5 and 7. Pt is concerned, as she was told by DME company to call her provider if the number goes over 5. Pt has a f/u appt already scheduled for 08/15/24.  Dr. Neda, please advise if you would like pt to keep her f/u appt on 08/15/24, or if you would like her to be seen earlier. Thank you!

## 2024-07-13 ENCOUNTER — Telehealth: Payer: Self-pay | Admitting: Pulmonary Disease

## 2024-07-13 NOTE — Telephone Encounter (Signed)
 Copied from CRM #[id number]. Topic: Clinical - Medical Advice >> Jul 13, 2024 11:38 AM Devaughn RAMAN wrote: Reason for CRM: Pt called regarding call to her insurance company. Pt is requesting a call to her insurance company [employer] to advise she needs a zepbound  shot due to her sleep apnea.  [phone]-[employer]

## 2024-07-13 NOTE — Telephone Encounter (Signed)
 Patient aware will keep jan appt.no further questions or concerns.NFN

## 2024-07-13 NOTE — Telephone Encounter (Signed)
 Called Camie and left a voice message asking for her to give the office a call back at [phone]

## 2024-07-13 NOTE — Telephone Encounter (Signed)
 Copied from CRM #[id number]. Topic: Clinical - Medication Question >> Jul 08, 2024  3:51 PM Thersia BROCKS wrote: Reason for CRM: [employer] [employer] -  patient appeal on medication, also has some clinical questions for a Dr.Kremer nurse   Needs any medication that the member has tried and failed in the past    Call or fax   Phone number [phone]   Fax number [phone]

## 2024-07-13 NOTE — Telephone Encounter (Signed)
 Can keep follow-up appointment  Continue using CPAP on a nightly basis  Continue to focus on weight loss and exercise regularly  Sleep study showed 52 events an hour.  As long as she continues to feel well, we will see her at next appointment, if not feeling well then she can be seen sooner

## 2024-07-14 NOTE — Telephone Encounter (Signed)
Dr. Olalere, please advise. 

## 2024-07-18 ENCOUNTER — Other Ambulatory Visit (HOSPITAL_BASED_OUTPATIENT_CLINIC_OR_DEPARTMENT_OTHER): Payer: Self-pay

## 2024-07-18 ENCOUNTER — Other Ambulatory Visit: Payer: Self-pay

## 2024-07-18 ENCOUNTER — Other Ambulatory Visit (HOSPITAL_COMMUNITY): Payer: Self-pay

## 2024-07-20 ENCOUNTER — Other Ambulatory Visit: Payer: Self-pay

## 2024-08-15 ENCOUNTER — Ambulatory Visit: Admitting: Pulmonary Disease

## 2024-08-15 ENCOUNTER — Encounter: Payer: Self-pay | Admitting: Pulmonary Disease

## 2024-08-15 VITALS — BP 116/76 | HR 64 | Temp 97.8°F | Ht 62.0 in | Wt 224.0 lb

## 2024-08-15 DIAGNOSIS — Z6841 Body Mass Index (BMI) 40.0 and over, adult: Secondary | ICD-10-CM | POA: Diagnosis not present

## 2024-08-15 DIAGNOSIS — G4733 Obstructive sleep apnea (adult) (pediatric): Secondary | ICD-10-CM

## 2024-08-15 DIAGNOSIS — E663 Overweight: Secondary | ICD-10-CM | POA: Diagnosis not present

## 2024-08-15 DIAGNOSIS — E66813 Obesity, class 3: Secondary | ICD-10-CM

## 2024-08-15 NOTE — Patient Instructions (Addendum)
 Download from your CPAP shows it is working well  You do qualify for tirzepatide  based on having severe obstructive sleep apnea and your current weight  I will refer you to our pharmacy team  I will document your qualification for it is a bedside and that I think you will benefit from it   Usually once you start his appetite, you should be seen about every month to make sure you are tolerating it well  Follow-up in about 6 to 8 weeks

## 2024-08-15 NOTE — Progress Notes (Signed)
 "              ARABELLE BOLLIG    [id number]    17-Mar-1961  Primary Care Physician:Kremer, Elsie Sayre, MD  Referring Physician: Berneta Elsie Sayre, MD [address] [place],  [place] [address]  Chief complaint:   Patient is being followed up for obstructive sleep apnea  Discussed the use of AI scribe software for clinical note transcription with the patient, who gave verbal consent to proceed.  History of Present Illness AUBREI BOUCHIE is a 64 year old female with sleep apnea who presents with persistent tiredness despite CPAP use.  She experiences persistent tiredness despite using her CPAP machine. Her previous sleep study indicated 52 apneic events per hour, with oxygen desaturation below 88% for 7 minutes. Currently, her CPAP machine records approximately 7 hours and 53 minutes of sleep, with pressure settings fluctuating between 5 and 15, typically around 14.7. The number of apnea events has decreased to 4.3 per hour, yet she still feels tired.  She is attempting to improve her diet by cutting out meals, reducing snacks, and increasing water intake. She does not engage in regular exercise.  There is no personal or family history of thyroid  problems, pancreas issues, or general gastrointestinal issues.   She has continued to use her CPAP on a nightly basis - Waking up feeling rested but still with continuing fatigue  Has not been able to successfully lose weight, she is watching her diet She is trying to stay active, trying to exercise but not losing weight yet  Download from machine shows it is working well  Regarding tirzepatide  -She will be a good candidate for this as she has obesity and severe obstructive sleep apnea - Weight loss would definitely help -Effort so far has not been helpful -Has no history of thyroid  problems, no pancreatic problems - No family history of endocrine neoplasia known to her  Outpatient Encounter Medications as of 08/15/2024   Medication Sig   aspirin 81 MG tablet Take 81 mg by mouth daily.   betamethasone  dipropionate 0.05 % cream Apply 1 application. topically daily as needed (eczema).   carboxymethylcellulose (REFRESH PLUS) 0.5 % SOLN Place 1 drop into both eyes 3 (three) times daily as needed (dry eyes).   escitalopram  (LEXAPRO ) 5 MG tablet Take 1 tablet (5 mg total) by mouth daily.   HYDROcodone -acetaminophen  (NORCO/VICODIN) 5-325 MG tablet Take 1-2 tablets by mouth every 4 (four) hours as needed.   loratadine  (CLARITIN ) 10 MG tablet Take 10 mg by mouth daily.   meclizine  (ANTIVERT ) 25 MG tablet Take 2 tablets (50 mg total) by mouth 3 (three) times daily as needed for dizziness.   metFORMIN  (GLUCOPHAGE -XR) 500 MG 24 hr tablet Take 1 tablet (500 mg total) by mouth 2 (two) times daily with a meal.   pantoprazole  (PROTONIX ) 40 MG tablet Take 40 mg by mouth daily as needed (indigestion).   pregabalin  (LYRICA ) 225 MG capsule Take 1 capsule (225 mg total) by mouth 2 (two) times daily.   pregabalin  (LYRICA ) 225 MG capsule Take 1 capsule (225 mg total) by mouth 2 (two) times daily.   pregabalin  (LYRICA ) 225 MG capsule Take 1 capsule (225 mg total) by mouth 2 (two) times daily.   rosuvastatin  (CRESTOR ) 10 MG tablet Take 1 tablet (10 mg total) by mouth daily.   valACYclovir (VALTREX) 500 MG tablet Take 500 mg by mouth 2 (two) times daily as needed (shingles outbreak).   tirzepatide  (ZEPBOUND ) 2.5 MG/0.5ML Pen Inject  2.5 mg into the skin once a week for 28 days, THEN 5 mg once a week. (Patient not taking: No sig reported)   No facility-administered encounter medications on file as of 08/15/2024.    Allergies as of 08/15/2024 - Review Complete 06/06/2024  Allergen Reaction Noted   Lipitor [atorvastatin] Other (See Comments) 09/09/2013    Past Medical History:  Diagnosis Date   Abnormally small mouth    Arthritis    knees   Carpal tunnel syndrome on right 09/2016   Dental crowns present    also dental caps    Family history of adverse reaction to anesthesia    pt's mother and son have hx. of post-op N/V   GERD (gastroesophageal reflux disease)    History of kidney stones    Hx of migraines    Hyperlipidemia    IBS (irritable bowel syndrome)    PONV (postoperative nausea and vomiting)    1 episode only    Past Surgical History:  Procedure Laterality Date   BACK SURGERY     CARPAL TUNNEL RELEASE Right 10/03/2016   Procedure: Right limited open CARPAL TUNNEL RELEASE;  Surgeon: Elsie Mussel, MD;  Location: [facility];  Service: Orthopedics;  Laterality: Right;   CESAREAN SECTION     CHOLECYSTECTOMY  05/27/2016   COLONOSCOPY WITH PROPOFOL   04/20/2015   CYSTO     EYE SURGERY     INCISION / DEBRIDEMENT BONE ELBOW Right 06/14/2003   radial head   KNEE ARTHROSCOPY Left 01/22/2010   MAXIMUM ACCESS (MAS)POSTERIOR LUMBAR INTERBODY FUSION (PLIF) 1 LEVEL     SALPINGOOPHORECTOMY Left 03/31/2002   STERIOD INJECTION Left 10/03/2016   Procedure: STEROID INJECTION;  Surgeon: Elsie Mussel, MD;  Location: [facility];  Service: Orthopedics;  Laterality: Left;   TOTAL ABDOMINAL HYSTERECTOMY  03/31/2002    Family History  Problem Relation Age of Onset   Anesthesia problems Mother        post-op N/V   Anesthesia problems Other        post-op N/V    Social History   Socioeconomic History   Marital status: Married    Spouse name: Not on file   Number of children: Not on file   Years of education: Not on file   Highest education level: Associate degree: occupational, scientist, product/process development, or vocational program  Occupational History   Not on file  Tobacco Use   Smoking status: Never   Smokeless tobacco: Never  Vaping Use   Vaping status: Never Used  Substance and Sexual Activity   Alcohol  use: No    Alcohol /week: 0.0 standard drinks of alcohol    Drug use: No   Sexual activity: Not on file  Other Topics Concern   Not on file  Social History Narrative   Not on file    Social Drivers of Health   Tobacco Use: Low Risk (08/15/2024)   Patient History    Smoking Tobacco Use: Never    Smokeless Tobacco Use: Never    Passive Exposure: Not on file  Financial Resource Strain: Low Risk (03/03/2024)   Overall Financial Resource Strain (CARDIA)    Difficulty of Paying Living Expenses: Not very hard  Food Insecurity: No Food Insecurity (03/03/2024)   Epic    Worried About Radiation Protection Practitioner of Food in the Last Year: Never true    Ran Out of Food in the Last Year: Never true  Transportation Needs: No Transportation Needs (03/03/2024)   Epic    Lack of  Transportation (Medical): No    Lack of Transportation (Non-Medical): No  Physical Activity: Inactive (03/03/2024)   Exercise Vital Sign    Days of Exercise per Week: 0 days    Minutes of Exercise per Session: Not on file  Stress: No Stress Concern Present (03/03/2024)   [employer] of Occupational Health - Occupational Stress Questionnaire    Feeling of Stress: Only a little  Social Connections: Moderately Integrated (03/03/2024)   Social Connection and Isolation Panel    Frequency of Communication with Friends and Family: More than three times a week    Frequency of Social Gatherings with Friends and Family: More than three times a week    Attends Religious Services: More than 4 times per year    Active Member of [employer] or Organizations: No    Attends Banker Meetings: Not on file    Marital Status: Married  Catering Manager Violence: Not on file  Depression (PHQ2-9): Low Risk (06/06/2024)   Depression (PHQ2-9)    PHQ-2 Score: 0  Alcohol  Screen: Not on file  Housing: Low Risk (03/03/2024)   Epic    Unable to Pay for Housing in the Last Year: No    Number of Times Moved in the Last Year: 0    Homeless in the Last Year: No  Utilities: Not on file  Health Literacy: Not on file    Review of Systems  Respiratory:  Positive for apnea. Negative for shortness of breath.   Psychiatric/Behavioral:   Positive for sleep disturbance.     Vitals:   08/15/24 1110  BP: 116/76  Pulse: 64  Temp: 97.8 F (36.6 C)  SpO2: 97%     Physical Exam Constitutional:      Appearance: She is obese.  HENT:     Head: Normocephalic.     Nose: Nose normal.     Mouth/Throat:     Mouth: Mucous membranes are moist.  Eyes:     General: No scleral icterus.    Pupils: Pupils are equal, round, and reactive to light.  Cardiovascular:     Rate and Rhythm: Normal rate and regular rhythm.     Heart sounds: No murmur heard.    No friction rub.  Pulmonary:     Effort: No respiratory distress.     Breath sounds: No stridor. No wheezing or rhonchi.  Musculoskeletal:     Cervical back: No rigidity or tenderness.  Neurological:     Mental Status: She is alert.     Data Reviewed: Sleep study reviewed  Download from her machine shows adequate treatment of sleep disordered breathing with an AHI of 4.3 down from 50s on recent sleep study  Assessment and Plan Assessment & Plan Obstructive sleep apnea Well-managed with CPAP therapy, reducing apnea events from 52 to 4.3 per hour. Oxygen saturation dropped below 88% for 7 minutes during the study. Persistent tiredness may be multifactorial, including insufficient sleep duration or other underlying conditions such as anemia. CPAP is effective in reducing apnea events, but persistent fatigue may require further evaluation. - Continue CPAP therapy with current settings. - Monitor AHI via app; aim for AHI below 5. - Consider stimulants if fatigue persists despite adequate sleep and CPAP efficacy, with caution regarding potential cardiac side effects.  Overweight Status is a concern, and she qualifies for GLP-1 receptor agonist therapy due to sleep apnea and overweight status. Insurance coverage for the medication is uncertain, but she is eligible for health programs if needed. Lifestyle modifications, including  diet and exercise, are essential for weight  management. GLP-1 receptor agonists may cause nausea, vomiting, and GI changes, especially at higher doses. Weight loss of 10% can reduce sleep apnea events by 25%. - Submitted order for Tazepatide to pharmacy team. - Encouraged dietary modifications and increased physical activity, aiming for more steps daily. - Discussed potential side effects of GLP-1 receptor agonists, including nausea, vomiting, and GI changes. - Educated on the importance of lifestyle changes in conjunction with medication for effective weight management.  Patient has moderate to severe obstructive sleep apnea related to obesity with BMI 40.97 posing significant cardiovascular risks. Patient has failed traditional weight loss measures with caloric deficit and consistent exercise of for over 6 months. Patient will be initiated on Zepbound  (tirzepatide ) for weight management. Zepbound  is the only pharmaceutical treatment approved for moderate-to-severe OSA in adults who are overweight (BMI >/27) or obese (BMI >/30). The patient will continue lifestyle modifications, including structured nutrition and physical activity as directed. No other GLP1 therapy will be used simultaneously at this time. The patient does not have any FDA labeled contraindications to this agent, including pregnancy, lactation, hx or family history of medullary thyroid  cancer, or multiple endocrine neoplasia type II. Side effect profile has been reviewed with patient. Aware of red flag symptoms to notify of immediately or seek emergency care, including severe nausea/vomiting, inability to pass bowels or gas, severe abdominal pain/tenderness, jaundice.   Will place a referral to pharmacy team to assist with initiation  Jennet Epley MD [facility] 08/15/2024, 11:31 AM  CC: Berneta Elsie Sayre, MD   "

## 2024-08-18 ENCOUNTER — Telehealth: Payer: Self-pay

## 2024-08-18 NOTE — Telephone Encounter (Signed)
 Received referral for Zepbound , see below:   Initiation of tirzepatide  -BMI over 40 -Severe obstructive sleep apnea -Unable to lose weight despite trying    Routing to Rx Prior Auth Team pool since this is a non-specialty medication.

## 2024-08-20 ENCOUNTER — Other Ambulatory Visit (HOSPITAL_COMMUNITY): Payer: Self-pay

## 2024-08-22 ENCOUNTER — Telehealth: Payer: Self-pay

## 2024-08-22 ENCOUNTER — Other Ambulatory Visit (HOSPITAL_COMMUNITY): Payer: Self-pay

## 2024-08-22 ENCOUNTER — Other Ambulatory Visit: Payer: Self-pay

## 2024-08-22 NOTE — Telephone Encounter (Signed)
 Pharmacy Patient Advocate Encounter   Received notification from Pt Calls Messages that prior authorization for Zepbound  2.5mg /0.45ml is required/requested.   Insurance verification completed.   The patient is insured through [facility] [employer].   Per test claim: PA required; PA submitted to above mentioned insurance via Latent Key/confirmation #/EOC [id number] Status is pending

## 2024-08-22 NOTE — Telephone Encounter (Signed)
 Pharmacy Patient Advocate Encounter  Received notification from [employer] that Prior Authorization for Zepbound  2.5mg /0.63ml has been CANCELLED due to there is a previously denied request on file.

## 2024-08-26 ENCOUNTER — Other Ambulatory Visit (HOSPITAL_COMMUNITY): Payer: Self-pay

## 2024-09-06 ENCOUNTER — Ambulatory Visit: Admitting: Family Medicine

## 2024-10-05 ENCOUNTER — Ambulatory Visit: Admitting: Pulmonary Disease
